# Patient Record
Sex: Female | Born: 1960 | Race: White | Hispanic: No | Marital: Married | Smoking: Former smoker
Health system: Southern US, Community
[De-identification: ages and names within clinical notes are randomized; demographics above are authoritative.]

## PROBLEM LIST (undated history)

## (undated) DIAGNOSIS — Z98811 Dental restoration status: Secondary | ICD-10-CM

## (undated) DIAGNOSIS — T07XXXA Unspecified multiple injuries, initial encounter: Secondary | ICD-10-CM

## (undated) DIAGNOSIS — F329 Major depressive disorder, single episode, unspecified: Secondary | ICD-10-CM

## (undated) DIAGNOSIS — M1990 Unspecified osteoarthritis, unspecified site: Secondary | ICD-10-CM

## (undated) DIAGNOSIS — E039 Hypothyroidism, unspecified: Secondary | ICD-10-CM

## (undated) DIAGNOSIS — F419 Anxiety disorder, unspecified: Secondary | ICD-10-CM

## (undated) DIAGNOSIS — M75100 Unspecified rotator cuff tear or rupture of unspecified shoulder, not specified as traumatic: Secondary | ICD-10-CM

## (undated) DIAGNOSIS — M7551 Bursitis of right shoulder: Secondary | ICD-10-CM

## (undated) DIAGNOSIS — F32A Depression, unspecified: Secondary | ICD-10-CM

## (undated) DIAGNOSIS — M19011 Primary osteoarthritis, right shoulder: Secondary | ICD-10-CM

## (undated) DIAGNOSIS — M24111 Other articular cartilage disorders, right shoulder: Secondary | ICD-10-CM

## (undated) HISTORY — PX: FOOT SURGERY: SHX648

---

## 1992-10-16 HISTORY — PX: REDUCTION MAMMAPLASTY: SUR839

## 1992-10-16 HISTORY — PX: BREAST REDUCTION SURGERY: SHX8

## 1998-06-25 ENCOUNTER — Encounter: Admission: RE | Admit: 1998-06-25 | Discharge: 1998-06-25 | Payer: Self-pay | Admitting: Family Medicine

## 1999-05-03 ENCOUNTER — Ambulatory Visit (HOSPITAL_COMMUNITY): Admission: RE | Admit: 1999-05-03 | Discharge: 1999-05-03 | Payer: Self-pay | Admitting: Obstetrics and Gynecology

## 1999-05-03 ENCOUNTER — Encounter: Payer: Self-pay | Admitting: Obstetrics and Gynecology

## 2000-06-29 ENCOUNTER — Ambulatory Visit (HOSPITAL_BASED_OUTPATIENT_CLINIC_OR_DEPARTMENT_OTHER): Admission: RE | Admit: 2000-06-29 | Discharge: 2000-06-29 | Payer: Self-pay | Admitting: Orthopedic Surgery

## 2000-06-29 HISTORY — PX: ENDOSCOPIC PLANTAR FASCIOTOMY: SUR443

## 2002-04-14 ENCOUNTER — Ambulatory Visit (HOSPITAL_COMMUNITY): Admission: RE | Admit: 2002-04-14 | Discharge: 2002-04-14 | Payer: Self-pay | Admitting: Obstetrics and Gynecology

## 2002-04-14 ENCOUNTER — Encounter: Payer: Self-pay | Admitting: Obstetrics and Gynecology

## 2002-09-04 ENCOUNTER — Encounter: Payer: Self-pay | Admitting: Obstetrics and Gynecology

## 2002-09-08 ENCOUNTER — Encounter (INDEPENDENT_AMBULATORY_CARE_PROVIDER_SITE_OTHER): Payer: Self-pay

## 2002-09-08 ENCOUNTER — Inpatient Hospital Stay (HOSPITAL_COMMUNITY): Admission: RE | Admit: 2002-09-08 | Discharge: 2002-09-09 | Payer: Self-pay | Admitting: Obstetrics and Gynecology

## 2002-09-08 HISTORY — PX: PUBOVAGINAL SLING: SHX1035

## 2002-09-09 HISTORY — PX: VAGINAL HYSTERECTOMY: SUR661

## 2003-01-22 ENCOUNTER — Ambulatory Visit (HOSPITAL_BASED_OUTPATIENT_CLINIC_OR_DEPARTMENT_OTHER): Admission: RE | Admit: 2003-01-22 | Discharge: 2003-01-22 | Payer: Self-pay | Admitting: Orthopedic Surgery

## 2003-01-22 HISTORY — PX: PLANTAR FASCIA RELEASE: SHX2239

## 2003-09-16 ENCOUNTER — Ambulatory Visit (HOSPITAL_COMMUNITY): Admission: RE | Admit: 2003-09-16 | Discharge: 2003-09-16 | Payer: Self-pay | Admitting: Obstetrics and Gynecology

## 2004-06-21 ENCOUNTER — Encounter: Admission: RE | Admit: 2004-06-21 | Discharge: 2004-09-19 | Payer: Self-pay | Admitting: Surgery

## 2004-06-23 ENCOUNTER — Encounter: Admission: RE | Admit: 2004-06-23 | Discharge: 2004-06-23 | Payer: Self-pay | Admitting: Surgery

## 2004-06-28 ENCOUNTER — Ambulatory Visit (HOSPITAL_COMMUNITY): Admission: RE | Admit: 2004-06-28 | Discharge: 2004-06-28 | Payer: Self-pay | Admitting: Surgery

## 2004-07-05 ENCOUNTER — Ambulatory Visit (HOSPITAL_COMMUNITY): Admission: RE | Admit: 2004-07-05 | Discharge: 2004-07-05 | Payer: Self-pay | Admitting: Surgery

## 2004-09-29 ENCOUNTER — Encounter: Admission: RE | Admit: 2004-09-29 | Discharge: 2004-12-28 | Payer: Self-pay | Admitting: Surgery

## 2004-10-06 ENCOUNTER — Encounter: Admission: RE | Admit: 2004-10-06 | Discharge: 2004-10-06 | Payer: Self-pay | Admitting: Surgery

## 2004-10-18 ENCOUNTER — Encounter (INDEPENDENT_AMBULATORY_CARE_PROVIDER_SITE_OTHER): Payer: Self-pay | Admitting: *Deleted

## 2004-10-18 ENCOUNTER — Inpatient Hospital Stay (HOSPITAL_COMMUNITY): Admission: RE | Admit: 2004-10-18 | Discharge: 2004-10-21 | Payer: Self-pay | Admitting: Surgery

## 2004-10-18 HISTORY — PX: ROUX-EN-Y GASTRIC BYPASS: SHX1104

## 2004-10-18 HISTORY — PX: UPPER GI ENDOSCOPY: SHX6162

## 2004-11-07 ENCOUNTER — Encounter: Admission: RE | Admit: 2004-11-07 | Discharge: 2004-11-07 | Payer: Self-pay | Admitting: Obstetrics and Gynecology

## 2005-01-10 ENCOUNTER — Encounter: Admission: RE | Admit: 2005-01-10 | Discharge: 2005-04-10 | Payer: Self-pay | Admitting: Surgery

## 2005-02-20 ENCOUNTER — Ambulatory Visit (HOSPITAL_BASED_OUTPATIENT_CLINIC_OR_DEPARTMENT_OTHER): Admission: RE | Admit: 2005-02-20 | Discharge: 2005-02-20 | Payer: Self-pay | Admitting: Surgery

## 2005-02-20 ENCOUNTER — Ambulatory Visit (HOSPITAL_COMMUNITY): Admission: RE | Admit: 2005-02-20 | Discharge: 2005-02-20 | Payer: Self-pay | Admitting: Surgery

## 2005-02-20 ENCOUNTER — Encounter (INDEPENDENT_AMBULATORY_CARE_PROVIDER_SITE_OTHER): Payer: Self-pay | Admitting: *Deleted

## 2005-04-11 ENCOUNTER — Encounter: Admission: RE | Admit: 2005-04-11 | Discharge: 2005-07-10 | Payer: Self-pay | Admitting: Surgery

## 2005-07-11 ENCOUNTER — Encounter: Admission: RE | Admit: 2005-07-11 | Discharge: 2005-10-09 | Payer: Self-pay | Admitting: Surgery

## 2005-10-31 ENCOUNTER — Encounter: Admission: RE | Admit: 2005-10-31 | Discharge: 2006-01-29 | Payer: Self-pay | Admitting: Surgery

## 2006-01-09 ENCOUNTER — Encounter: Admission: RE | Admit: 2006-01-09 | Discharge: 2006-01-09 | Payer: Self-pay | Admitting: Surgery

## 2006-03-20 ENCOUNTER — Emergency Department (HOSPITAL_COMMUNITY): Admission: EM | Admit: 2006-03-20 | Discharge: 2006-03-20 | Payer: Self-pay | Admitting: Family Medicine

## 2007-01-25 ENCOUNTER — Encounter: Admission: RE | Admit: 2007-01-25 | Discharge: 2007-01-25 | Payer: Self-pay | Admitting: Obstetrics and Gynecology

## 2007-02-18 ENCOUNTER — Emergency Department (HOSPITAL_COMMUNITY): Admission: EM | Admit: 2007-02-18 | Discharge: 2007-02-18 | Payer: Self-pay | Admitting: Emergency Medicine

## 2008-02-03 ENCOUNTER — Encounter: Admission: RE | Admit: 2008-02-03 | Discharge: 2008-02-03 | Payer: Self-pay | Admitting: Surgery

## 2009-03-02 ENCOUNTER — Encounter: Admission: RE | Admit: 2009-03-02 | Discharge: 2009-03-02 | Payer: Self-pay | Admitting: Obstetrics and Gynecology

## 2009-04-01 ENCOUNTER — Ambulatory Visit (HOSPITAL_COMMUNITY): Admission: RE | Admit: 2009-04-01 | Discharge: 2009-04-01 | Payer: Self-pay | Admitting: Orthopaedic Surgery

## 2009-07-06 ENCOUNTER — Encounter: Admission: RE | Admit: 2009-07-06 | Discharge: 2009-07-06 | Payer: Self-pay | Admitting: Surgery

## 2009-12-02 ENCOUNTER — Emergency Department (HOSPITAL_COMMUNITY): Admission: EM | Admit: 2009-12-02 | Discharge: 2009-12-02 | Payer: Self-pay | Admitting: Emergency Medicine

## 2010-10-18 ENCOUNTER — Encounter
Admission: RE | Admit: 2010-10-18 | Discharge: 2010-10-18 | Payer: Self-pay | Source: Home / Self Care | Attending: Internal Medicine | Admitting: Internal Medicine

## 2010-11-06 ENCOUNTER — Encounter: Payer: Self-pay | Admitting: Surgery

## 2010-11-25 ENCOUNTER — Ambulatory Visit (HOSPITAL_COMMUNITY)
Admission: RE | Admit: 2010-11-25 | Discharge: 2010-11-25 | Disposition: A | Discharge status: Home / Self Care | Payer: 59 | Source: Ambulatory Visit | Attending: Gastroenterology | Admitting: Gastroenterology

## 2010-11-25 DIAGNOSIS — R1310 Dysphagia, unspecified: Secondary | ICD-10-CM | POA: Insufficient documentation

## 2010-11-25 DIAGNOSIS — D509 Iron deficiency anemia, unspecified: Secondary | ICD-10-CM | POA: Insufficient documentation

## 2010-11-25 DIAGNOSIS — Z9884 Bariatric surgery status: Secondary | ICD-10-CM | POA: Insufficient documentation

## 2011-03-03 NOTE — Op Note (Signed)
   NAME:  Brooke French, Brooke French                       ACCOUNT NO.:  [id number]   MEDICAL RECORD NO.:  [id number]                   PATIENT TYPE:  INP   LOCATION:  NA                                   FACILITY:  [facility]   PHYSICIAN:  Katherine Roan, M.D.               DATE OF BIRTH:  [birth year]   DATE OF PROCEDURE:  DATE OF DISCHARGE:  09/09/2002                                 OPERATIVE REPORT   PREOPERATIVE DIAGNOSIS:  Profuse periods, stress incontinence with urethral  hypermobility.   POSTOPERATIVE DIAGNOSIS:  Profuse periods, stress incontinence with urethral  hypermobility.   OPERATION PERFORMED:  Pelvic examination under anesthesia, vaginal  hysterectomy.   SURGEON:  Katherine Roan, M.D.   ANESTHESIA:   DESCRIPTION OF PROCEDURE:  The patient was placed in lithotomy position and  prepped and draped in the usual fashion.  Pelvic examination under  anesthesia was accomplished.  The uterus was normal in size and shape.  No  adnexal masses were noted.  A weighted speculum was placed into the vagina  and the cervix was grasped with the tenaculum.  It was injected with 10 cc  of 1% Xylocaine with epinephrine.  The posterior cul-de-sac was incised with  sharp dissection.  A Bonanno weighted retractor was placed into the  posterior cul-de-sac.  The uterosacral ligaments and cardinals were  skeletonized and ligated separately with 0 chromic suture.  The peritoneum  was pushed up inferiorly.  It was quite thickened secondary to her previous  cesarean section.  Small bites of the broad ligament to encompass the  uterine artery and vein and upper broad ligament were accomplished.  The  anterior peritoneum was entered and the remaining portion of the uterus was  removed.  Both ovaries appeared to be normal.  The left ovary was very  small.  The uterosacral ligaments were plicated in the midline with one  suture of 0 Vicryl and then the peritoneum was closed with a running suture  of 2-0  PDS.  The vagina was closed with a continuation of this suture.  Hemostasis was secured.  One additional suture was placed at the uterosacral  ligament insertion for hemostasis.  Dr. Vernie Ammons then did a string procedure.  The patient tolerated the procedure well.  The estimated blood loss was  under 100 cc.                                                Katherine Roan, M.D.    SDM/MEDQ  D:  09/08/2002  T:  09/08/2002  Job:  [id number]

## 2011-03-03 NOTE — Op Note (Signed)
NAMESAWYER, KAHAN             ACCOUNT NO.:  [id number]   MEDICAL RECORD NO.:  [id number]          PATIENT TYPE:  INP   LOCATION:  X001                         FACILITY:  [facility]   PHYSICIAN:  Thornton Park. Daphine Deutscher, MD  DATE OF BIRTH:  December 28, 1960   DATE OF PROCEDURE:  10/18/2004  DATE OF DISCHARGE:                                 OPERATIVE REPORT   PREOPERATIVE DIAGNOSIS:  Morbid obesity.   POSTOPERATIVE DIAGNOSIS:  Morbid obesity.   PROCEDURE:  1.  Laparoscopic Roux-en-Y gastric bypass with 40-cm biliopancreatic limb,      100-cm roux limb placed to the right of the BP limb with the candy cane      to the left.  2.  Upper endoscopy.   SURGEON:  Thornton Park. Daphine Deutscher, M.D.   ASSISTANT:  Vikki Ports, M.D.   ANESTHESIA:  General endotracheal.   DRAINS:  One Blake drain in the upper abdomen.   ESTIMATED BLOOD LOSS:  75 cc.   DESCRIPTION OF PROCEDURE:  Brooke French is a 50 year old morbidly obese  white female who obtained preoperative bariatric counseling which is well  documented in the history.  She was taken to room 1 where the PAS hose were  placed, preoperative antibiotics and preoperative bowel prep, and  preoperative anticoagulants had been given.  The abdomen was prepped widely  with Betadine and draped sterilely.  Through the left upper quadrant, I used  the Optiview technique and entered the abdomen using a 12 trocar without  difficulty.  The abdomen was insufflated and then two 12's were placed in  the right upper quadrant, one to the left of midline and another 5 lateral  on the right, and ultimately a 5-mm in the upper midline for the [facility]  retractor.   The ligament of Treitz was first identified and we went 40 cm distal where I  divided it.  I noted along the way that there were some round darkish areas  in the small bowel that could possibly represent AVMs or a little  hemangiomata.  This was subsequently seen scattered throughout the omentum  and a biopsy of these was sent which showed no evidence of malignancy and  permanent sections are pending.  I used the harmonic scalpel to go into the  mesentery to free this up somewhat and control bleeding with the harmonic  scalpel.   A suture with Penrose was placed on the roux limb and the roux was measured  to be at least 100 cm.  The jejunojejunostomy was created in a side-to-side  fashion aligning the distal end of the biliopancreatic limb with the distal  efferent outflow tract from the roux limb and a common channel was created  using Endo GIA.  The defect was closed with two 2-0 Vicryl sutures with  Tisseel.  A 2-0 silk was used to close the mesenteric defect using  Laparoties on either end.   Next, we went to the upper abdomen where I dissected the cardia and created  a window for subsequent pouch creation.  I then went along the lesser  curvature and divided this with a  single application of the 4-cm stapler  followed by two applications of the 6 and one of the 4 to create a nice  small pouch.  It was approximately 4.5 cm in length.  The bleeder up on the  upper reaches of this was controlled with the clips and Tisseel was applied  to the proximal staple lines.  The Gastrografin was inspected and there was  no evidence of any extra or intracorporeal bleeding.   The roux limb was then brought up and a back row was sutured along the  antimesenteric border with the 2-0 Vicryl.  A common opening was made to the  right at the stomach and with Ewald tube in place to facilitate that.  A  single 4-cm GIA was inserted and fired.  The common opening was inspected  and no bleeding was noted and it was closed with 2-0 Vicryl from either end  tied in the middle.  A second layer using a free suture with needle drivers  of 2-0 Vicryl was used for the Laparotie beginning on the left side and  suturing toward the right.  This completed a two-layer anastomosis and the  Ewald tube was  removed.  With occlusion, Dr. Luan Pulling passed the endoscope  into the gastric pouch and with insufflation, I could see bubbles on the  left side at the gastrojejunostomy.  I placed a U stitch of 2-0 silk,  getting good deep bite and tied this with the Laparotie.  The stomach was  then re-insufflated under pressure and no bubbles were seen.  We irrigated  well.  Tisseel was then applied anteriorly to the staple line and the drain  was placed beneath the liver.  No bleeding was noted and everything appeared  to be in order.  Penrose had been removed from the tip of the roux limb and  the endoscopy showed a nice small pouch with a patent gastrojejunostomy.  We  could not see where the leak was occurring from the inside.  However, the U  stitch of 2-0 silk seemed to completely obliterate that.  After the Tisseel  was applied, the abdomen was deflated and trocars were removed.  Wounds were  injected with Marcaine and then closed with 4-0 Vicryl subcuticularly with  staples as well.     Matt   MBM/MEDQ  D:  10/18/2004  T:  10/18/2004  Job:  [id number]

## 2011-03-03 NOTE — Op Note (Signed)
   NAME:  Brooke French, Brooke French                       ACCOUNT NO.:  [id number]   MEDICAL RECORD NO.:  [id number]                   PATIENT TYPE:  AMB   LOCATION:  DSC                                  FACILITY:   PHYSICIAN:  Loreta Ave, M.D.              DATE OF BIRTH:  [birth year]   DATE OF PROCEDURE:  01/22/2003  DATE OF DISCHARGE:                                 OPERATIVE REPORT   PREOPERATIVE DIAGNOSIS:  Chronic plantar fasciitis, left heel.   POSTOPERATIVE DIAGNOSIS:  Chronic plantar fasciitis, left heel.   OPERATION/PROCEDURE:  Fluoroscopically and endoscopically guided plantar  fascia release, left heel.   SURGEON:  Loreta Ave, M.D.   ASSISTANT:  Arlys John D. Petrarca, P.A.-C.   ANESTHESIA:  Ankle block and general.   SPECIMENS:  None.   CULTURES:  None.   COMPLICATIONS:  None.   DRESSINGS:  Sterile compressive.   TOURNIQUET TIME:  20 minutes.   DESCRIPTION OF PROCEDURE:  The patient was brought to the operating room and  after adequate anesthesia had been obtained, calf tourniquet was applied.  Prepped and draped in the usual sterile fashion.  Exsanguinated with  elevation and Esmarch.  Tourniquet inflated to 250 mmHg.  Fluoroscopic  assistance to isolate the plantar fascia attachment to the spur at the os  calcis.  Small incision made on the medial and lateral aspects of this.  Spreading instrument was used to clear off the undersurface of the plantar  fascia and then a cutting cannula inserted from medial to lateral aspects.  Under endoscopic guidance, the plantar fascia was cut in its entirety of the  os calcis attachment from medial to lateral sides, protecting injury from  neurovascular structures.  After an adequate confirmation of complete  release, arthroscope was removed.  Wound was irrigated and injected with  Marcaine as were the margins of the wound.  The wound was closed with nylon.  Sterile compressive dressing applied.  Tourniquet deflated and  removed.  Anesthesia reversed.  Brought to the recovery room.  Tolerated the surgery  well without complications.                                                Loreta Ave, M.D.    DFM/MEDQ  D:  01/22/2003  T:  01/23/2003  Job:  [id number]

## 2011-03-03 NOTE — Op Note (Signed)
NAME:  Brooke French, Brooke French                       ACCOUNT NO.:  [id number]   MEDICAL RECORD NO.:  [id number]                   PATIENT TYPE:  INP   LOCATION:  X003                                 FACILITY:  [facility]   PHYSICIAN:  Mark C. Vernie Ammons, M.D.               DATE OF BIRTH:  [birth year]   DATE OF PROCEDURE:  09/08/2002  DATE OF DISCHARGE:                                 OPERATIVE REPORT   PREOPERATIVE DIAGNOSIS:  Stress urinary incontinence.   POSTOPERATIVE DIAGNOSIS:  Stress urinary incontinence.   OPERATION PERFORMED:  Spark sling.   SURGEON:  Mark C. Vernie Ammons, M.D.   ANESTHESIA:  General.   ESTIMATED BLOOD LOSS:  Approximately 25 cc.   DRAINS:  16 French Foley catheter.   SPECIMENS:  None.   COMPLICATIONS:  None.   INDICATIONS FOR PROCEDURE:  The patient is a 50 year old white female  patient of Dr. Elana Alm, who has significant urinary incontinence with cough  and was felt to have type 3 stress urinary incontinence by Dr. Earlene Plater.  A  straining cystogram revealed descensus of the bladder and urodynamics back  in 1998 showed high leak point pressure.  Her incontinence since that time  has worsened and occurs now with little provocation.  She was found to have  marked Q-tip hypermobility with Valsalva on exam and has not history of  irritative voiding symptoms or unstable bladder.  She is felt to be a good  candidate for a sling procedure.  She understands the risks and  complications associated with this and has elected to proceed.   DESCRIPTION OF PROCEDURE:  After informed consent, the patient was brought  to the major operating room, placed on the table and administered general  anesthesia and then moved to dorsal lithotomy position after which Dr.  Elana Alm performed a transvaginal hysterectomy which is dictated by him  separately.  After he had completed his portion of the operation, with a  weighted speculum in the vagina and a 16 French Foley catheter in the  bladder, the urethral region was palpated.  An incision in the anterior  vaginal mucosa over the mid urethra was made and dissected laterally  exposing the urethra.  Two separate stab incisions were then made in the  pubic region approximately three fingerbreadths apart just above the  symphysis pubis and the bladder fully drained of urine with the catheter.  A  cystoscope sheath was placed in the urethra drawing the urethra away from  the fat as I was passing the trocar and the trocar was then passed first on  the left side, then the right side down behind the symphysis pubis and out  through the vaginal incision at the mid urethral level.  This was performed  on both sides identically.  The Foley catheter was reinserted in the bladder  and the [facility] tape was affixed to the trocars and brought up through the  suprapubic incisions.  I then adjusted the tension so there was no tension  on the sling, after which the sheath was removed from the sling material and  the excess sling material was cut flush with the skin and the incisions were  copiously irrigated with antibiotic solution.  The suprapubic incisions were  closed with Dermabond and the vaginal incision was closed with a running #1  Vicryl suture.  The Foley catheter was left indwelling and the patient was  taken to the recovery room in stable satisfactory condition.  During the  operation, cystoscopy was performed after passage of both trocars and then  after passage of the sling material and no injury to the bladder was  identified.  There were also no lesions, tumors or stones or other  abnormalities within the bladder.   The patient will be observed in the hospital and the catheter will be left  indwelling overnight and removed tomorrow for a voiding trial.                                                 Loraine Leriche C. Vernie Ammons, M.D.    MCO/MEDQ  D:  09/08/2002  T:  09/08/2002  Job:  [id number]   cc:   S. Kyra Manges, M.D.  [phone]  [address]  [place]  [place] [address]  Fax: [phone]

## 2011-03-03 NOTE — Op Note (Signed)
[facility]  Patient:    Brooke French, Brooke French                    MRN: [id number] Proc. Date: 06/29/00 Adm. Date:  [id number] Disc. Date: [id number] Attending:  Colbert Ewing                           Operative Report  PREOPERATIVE DIAGNOSIS:  ____________ plantar fasciitis, right foot.  POSTOPERATIVE DIAGNOSIS:  ____________ plantar fasciitis, right foot.  OPERATION PERFORMED:  Endoscopic plantar fascia release, right foot.  SURGEON:  Loreta Ave, M.D.  ANESTHESIA:  Ankle block with posterior tibial nerve block.  TOURNIQUET TIME:  30 minutes.  SPECIMENS:  None.  CULTURES:  None.  COMPLICATIONS:  None.  DRESSING:  Soft compressive with wooden shoe.  DESCRIPTION OF PROCEDURE:  Patient brought to the operating room and after adequate anesthesia had been obtained, prepped and draped in the usual sterile fashion.  Exsanguinated with elevation and use of an Esmarch.  Esmarch used for tourniquet at the ankle.  With fluoroscopic guidance a small incision was made on the medial and lateral aspect of the foot just plantar to the plantar fascia and distal to the os calcis attachment.  The spreading device for endoscopic release was used to spread tissue below the plantar fascia.  The cannula was then inserted from medial to lateral.  Under direct visualization the plantar fascia was released in its entirety with a hook knife avoiding neurovascular structures.  The wound was irrigated throughout.  The wound was closed with nylon.  Sterile compressive dressing applied with wooden shoe. Tourniquet inflated and removed.  Anesthesia reversed.  Brought to recovery room.  Tolerated surgery well.  No complications. DD:  06/29/00 TD:  07/02/00 Job: [id number]

## 2011-03-03 NOTE — Op Note (Signed)
[name], Brooke French             ACCOUNT NO.:  [id number]   MEDICAL RECORD NO.:  [id number]          PATIENT TYPE:  INP   LOCATION:  X001                         FACILITY:  [facility]   PHYSICIAN:  Vikki Ports, MDDATE OF BIRTH:  May 22, 1961   DATE OF PROCEDURE:  10/18/2004  DATE OF DISCHARGE:                                 OPERATIVE REPORT   PREOPERATIVE DIAGNOSIS:  Morbid obesity.   POSTOPERATIVE DIAGNOSIS:  Morbid obesity.   PROCEDURE:  Upper endoscopy at the conclusion of the laparoscopic Roux-En-Y  gastric bypass.   FINDINGS:  Initially a leak was seen and then after suture repair which will  be dictated later, the pouch was intact with patent anastomosis and no  evidence of leak.  Endoscope was removed after the stomach pouch was  decompressed.      KRH/MEDQ  D:  10/18/2004  T:  10/18/2004  Job:  [id number]

## 2011-03-03 NOTE — H&P (Signed)
NAME:  Brooke French, PATLAN                       ACCOUNT NO.:  [id number]   MEDICAL RECORD NO.:  [id number]                   PATIENT TYPE:  INP   LOCATION:  NA                                   FACILITY:  [facility]   PHYSICIAN:  Katherine Roan, M.D.               DATE OF BIRTH:  26-Apr-1961   DATE OF ADMISSION:  DATE OF DISCHARGE:                                HISTORY & PHYSICAL   CHIEF COMPLAINT:  Heavy periods.   HISTORY OF PRESENT ILLNESS:  Brooke French is a 50 year old Gravida I, Para I  female who had a C-section in 1996 who complains of heavy painful periods  despite the fact that we have tried her on birth control pills, even one and  two daily. Because of the continued bleeding, hysterectomy was offered. PAP  smear was negative in August. She currently takes Synthroid and Prozac. She  was cystoscoped in 1965. She had a breast reduction in 1994. Right heel  plantar fascia surgery in 2001.   ALLERGIES:  No known drug allergies.   REVIEW OF SYSTEMS:  HEENT, she wears glasses but notes no dizziness,  decreased in visual or auditory acuity. No frequent headaches. Heart, no  hypertension. No rheumatic fever or chest pain. Lungs, no chronic cough. She  recently had bronchitis but feels she is free from that at this point. GU,  she has been evaluated by Dr. Ihor Gully and found to have urethral  instability and he feels that a sling would work for her. GI, no bowel habit  change. No weight loss or melena. MS, no fractures or arthritis.   SOCIAL HISTORY:  She works at [employer]. Drinks alcohol  socially. Does not smoke.   FAMILY HISTORY:  Her mother is 56 years of age. She is a borderline diabetic  and has mild hypertension. Her father is 73 years of age. He is diabetic and  has coronary artery disease and has had a coronary artery bypass grafting.  He also has emphysema. She has two brothers, one deceased from a cerebral  aneurysm and one is a diabetic. Her father  had skin cancer and her father,  uncle and brother are both diabetics.   PHYSICAL EXAMINATION:  GENERAL: Pleasant, cooperative, alert, well  developed, well nourished  female who appeared to be her stated age of 4  years.  VITAL SIGNS: Blood pressure 140/80. Weight 284 pounds.  HEENT: Unremarkable examination. Pupils are equal, round, and reactive to  light and accommodation. Extraocular muscles intact. Oropharynx not  injected.  NECK: Supple. Carotid pulses are equal. No bruits. Thyroid not enlarged.  BREASTS: No masses or tenderness.  LUNGS: Clear to auscultation and percussion.  ABDOMEN: Soft and obese. Liver, spleen, and kidneys are not palpated. Bowel  sounds are normal. No tenderness noted.  GU: Vulva and vagina are normal. Cervix is clean. Uterus is anterior. Adnexa  negative. Rectovaginal confirms.  EXTREMITIES:  Good range of motion. Equal pulses and reflexes.   IMPRESSION:  1. Perfuse periods.  2. Stress incontinence.   PLAN:  Examination under anesthesia. Possible vaginal versus abdominal  hysterectomy. Risks and benefits have been discussed with the patient  including infection, hemorrhage, damage to bladder and bowel, and vascular  injuries.                                                Katherine Roan, M.D.    SDM/MEDQ  D:  09/05/2002  T:  09/05/2002  Job:  [id number]

## 2011-03-03 NOTE — Discharge Summary (Signed)
[name], [name] NO.:  [id number]   MEDICAL RECORD NO.:  [id number]           PATIENT TYPE:   LOCATION:  0482                         FACILITY:  [facility]   PHYSICIAN:  Thornton Park. Daphine Deutscher, MD  DATE OF BIRTH:  [birth year]   DATE OF ADMISSION:  10/18/2004  DATE OF DISCHARGE:  10/21/2004                                 DISCHARGE SUMMARY   PROCEDURE:  On October 18, 2004 laparoscopic Roux-en-Y gastric bypass.   HOSPITAL COURSE:  Brooke French was taken to the operating room on  Tuesday, October 18, 2004 and given general anesthesia.  She underwent  laparoscopic Roux-en-Y gastric bypass with follow up in the OR endoscopy.  On postoperative day #1, she had an upper GI swallow which showed a patent  anastomosis and a good pouch and DVT studies which were negative.  Her drain  drained a minimal amount and she was started on liquids and on postoperative  days #2 and #3, her diet was advanced appropriately.  She did well and was  ready for discharge on Friday, January 6.   FINAL DIAGNOSIS:  Morbid obesity, status post laparoscopic Roux-en-Y gastric  bypass.   CONDITION ON DISCHARGE:  Good.     Matt   MBM/MEDQ  D:  10/21/2004  T:  10/21/2004  Job:  [id number]

## 2011-07-12 ENCOUNTER — Inpatient Hospital Stay (INDEPENDENT_AMBULATORY_CARE_PROVIDER_SITE_OTHER)
Admission: RE | Admit: 2011-07-12 | Discharge: 2011-07-12 | Disposition: A | Discharge status: Home / Self Care | Payer: Commercial Managed Care - PPO | Source: Ambulatory Visit | Attending: Emergency Medicine | Admitting: Emergency Medicine

## 2011-07-12 DIAGNOSIS — J019 Acute sinusitis, unspecified: Secondary | ICD-10-CM

## 2011-10-20 ENCOUNTER — Other Ambulatory Visit (HOSPITAL_COMMUNITY): Payer: Self-pay | Admitting: Orthopedic Surgery

## 2011-10-20 DIAGNOSIS — M25569 Pain in unspecified knee: Secondary | ICD-10-CM

## 2011-10-24 ENCOUNTER — Ambulatory Visit (HOSPITAL_COMMUNITY)
Admission: RE | Admit: 2011-10-24 | Discharge: 2011-10-24 | Disposition: A | Discharge status: Home / Self Care | Payer: 59 | Source: Ambulatory Visit | Attending: Orthopedic Surgery | Admitting: Orthopedic Surgery

## 2011-10-24 DIAGNOSIS — S83509A Sprain of unspecified cruciate ligament of unspecified knee, initial encounter: Secondary | ICD-10-CM | POA: Insufficient documentation

## 2011-10-24 DIAGNOSIS — M25569 Pain in unspecified knee: Secondary | ICD-10-CM

## 2011-10-24 DIAGNOSIS — X500XXA Overexertion from strenuous movement or load, initial encounter: Secondary | ICD-10-CM | POA: Insufficient documentation

## 2011-10-24 DIAGNOSIS — M25469 Effusion, unspecified knee: Secondary | ICD-10-CM | POA: Insufficient documentation

## 2011-10-25 NOTE — Op Note (Signed)
[facility] [address] [place], [place]  [address]  OPERATIVE PROCEDURE REPORT  PATIENT:  Brooke French, Brooke French   MR#:  [id number] BIRTHDATE:  09-04-1961  GENDER:  female ENDOSCOPIST:  Coralyn Mark Dr.Mann, MD ASSISTANT:  Ara Kussmaul, Technician and Dwain Sarna, RN CGRN.  PROCEDURE DATE:  11/25/2010 PRE-PROCEDURE PREPERATION:  The patient was prepped with 32 oz. of Suprep the night before the procedure and 32 oz. the morning of the procedure.  The patient was fasted for 4 hours prior to the procedure. PRE-PROCEDURE PHYSICAL:  Patient has stable vital signs. Neck is supple. There is no JVD, thyromegaly or LAD. Chest clear to auscultation. S1 and S2 regular. Abdomen soft, non-distended, non-tender with NABS. PROCEDURE:  Diagnostic colonoscopy. ASA CLASS:  Class II INDICATIONS:  1) Colorectal cancer screening 2) Iron deficiency anemia. MEDICATIONS:  Fentanyl 50 mcg & Versed 5 mg IV.  DESCRIPTION OF PROCEDURE: After the risks, benefits, and alternatives of the procedure were thoroughly explained [including a 10% missed rate of cancer and polyps], informed consent was obtained.  Digital rectal exam was performed.  The Pentax Colonoscope [id number] colonoscope was introduced through the anus and advanced to the terminal ileum which was intubated for a short distance, without limitations.  The quality of the prep was excellent. Multiple washes were done. Small lesions could be missed. The instrument was then slowly withdrawn as the colon was fully examined. <<PROCEDUREIMAGES>>  FINDINGS:  Except for a lipoma in the proximal right colon [recognized by its yellowish submucosal appearance] the entire colonic mucosa appeared healthy with a normal vascular pattern. No masses, polyps, diverticula or AVM's were noted. The appendiceal orifice and the ICV were identified and photographed. The terminal ileum appeared normal.  Retroflexed views revealed no abnormalities. The  patient tolerated the procedure without immediate complications.  The scope was then withdrawn from the patient and the procedure terminated.  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 RECOMMENDATIONS:  1) Continue surveillance. 2) OP follow-up is advised on a PRN basis.  REPEAT EXAM:  In 10 years; in case the patient has any abnormal GI symptoms in the interim, she should contact the office immediately for further recommendations.  DISCHARGE INSTRUCTIONS:  Standard discharge instructions given.  ______________________________ Benjamine Mola, MD  CPT CODES: 27253  DIAGNOSIS CODES:  V76.51, 280.9  CC:  Geoffry Paradise, M.D.  n. eSIGNED:   Dr.Jyothi Anastasio Auerbach at 10/25/2011 10:17 AM  Lamarr Lulas, [phone]

## 2011-11-09 ENCOUNTER — Encounter (HOSPITAL_BASED_OUTPATIENT_CLINIC_OR_DEPARTMENT_OTHER): Payer: Self-pay | Admitting: *Deleted

## 2011-11-15 NOTE — H&P (Signed)
[facility] [address]   [address] [place], [place] [address] [phone] A Division of [facility]  Loreta Ave, M.D.     Robert A. Thurston Hole, M.D.     Lunette Stands, M.D. Eulas Post, M.D.    Buford Dresser, M.D. Estell Harpin, M.D. Ralene Cork, D.O.          Genene Churn. Barry Dienes, PA-C            Kirstin A. Shepperson, PA-C [place], OPA-C  RE: Brooke French, Brooke French   [id number]      DOB: May 19, 1961 PROGRESS NOTE: 10-20-11 51 year old patient of Dr. Greig Right she's having problems with her left knee, over New Years she was dancing doing a twisting maneuver heard and felt a pop, was able to continue as she had been drinking but the following morning woke with significant swelling in the left knee. It has improved since then but she continues to have pain with twisting. She denies any instability. Review of systems: this knee was injured in 1995 and treated conservatively. No known drug allergies. No diabetes or cholesterol problems. Complete review of 14 systems negative. Past medical history: she takes Prozac, Synthroid, Biotin Vitamin E and vitamin D. Previous surgery for hysterectomy breast reduction gastric bypass and plantar fascia surgery. Family history is noncontributory but reviewed. Social history: she does not smoke she drinks rarely is employed as Surveyor, mining for [employer].   EXAMINATION: 51 year old moderately overweight. Ambulates with a limp. Left knee has moderate swelling compared to the right, most of the swelling is medial. She has tenderness along both joint lines she describes pain as deep and posterior range of motion of the knee -10 degrees to full extension flexion to about 90 degrees. Varus and valgus stability intact anterior drawer test is 1+ with a soft end point. Right knee is also 1+ with a more definite end point. She resists McMurray's maneuver on the left it is uncomfortable, I was unable  to produce a pivot shift.  X-RAYS: Standing AP lateral and patellar view of the left knee unremarkable.  IMPRESSION: Rule out meniscal or ACL tear or both.  PLAN: We'll proceed with an MRI. I also discussed this with Dr. Eulah Pont who agrees and examined her today. Follow up after MRI to delineate therapeutic recommendations.  Lunette Stands, M.D.  Electronically verified by Lunette Stands, M.D. AV:kh D 10-20-11 T 10-23-11  [facility] [address]   [address] [place],  [address] [phone] A Division of [facility]  Loreta Ave, M.D.     Robert A. Thurston Hole, M.D.     Lunette Stands, M.D. Eulas Post, M.D.    Buford Dresser, M.D. Estell Harpin, M.D. Ralene Cork, D.O.          Genene Churn. Barry Dienes, PA-C            Kirstin A. Shepperson, PA-C [place], OPA-C   RE: Brooke French, Brooke French   [id number]      DOB: [birth year] PROGRESS NOTE: 10-26-11 I went over [name]'s MRI x-rays history and reviewed exam with her today. Vertical load twisting injury with a pivot shift phenomenon. Exam MRI and history consistent with an ACL injury with anterolateral rotary instability. No meniscal tears. Her swelling is better she's able to full weight bear. Motion 0-90.  DISPOSITION: Long talk with her about diagnosis and treatment options. We talked about conservative treatment cautious activity  versus allograft reconstruction. Based on her injury and description of symptoms I think that the best recommendation is reconstruction. She'll consider options and let me know how she wants to proceed. All questions answered pre-op precautions exercise program outlined.  Loreta Ave, M.D.  Electronically verified by Loreta Ave, M.D. DFM:kh D 10-26-11 T 10-27-11

## 2011-11-16 ENCOUNTER — Encounter (HOSPITAL_BASED_OUTPATIENT_CLINIC_OR_DEPARTMENT_OTHER): Payer: Self-pay | Admitting: Certified Registered Nurse Anesthetist

## 2011-11-16 ENCOUNTER — Ambulatory Visit (HOSPITAL_BASED_OUTPATIENT_CLINIC_OR_DEPARTMENT_OTHER): Payer: 59 | Admitting: Certified Registered Nurse Anesthetist

## 2011-11-16 ENCOUNTER — Ambulatory Visit (HOSPITAL_BASED_OUTPATIENT_CLINIC_OR_DEPARTMENT_OTHER)
Admission: RE | Admit: 2011-11-16 | Discharge: 2011-11-17 | Disposition: A | Discharge status: Home / Self Care | Payer: 59 | Source: Ambulatory Visit | Attending: Orthopedic Surgery | Admitting: Orthopedic Surgery

## 2011-11-16 ENCOUNTER — Encounter (HOSPITAL_BASED_OUTPATIENT_CLINIC_OR_DEPARTMENT_OTHER)
Admission: RE | Disposition: A | Discharge status: Home / Self Care | Payer: Self-pay | Source: Ambulatory Visit | Attending: Orthopedic Surgery

## 2011-11-16 ENCOUNTER — Encounter (HOSPITAL_BASED_OUTPATIENT_CLINIC_OR_DEPARTMENT_OTHER): Payer: Self-pay | Admitting: *Deleted

## 2011-11-16 DIAGNOSIS — Z4789 Encounter for other orthopedic aftercare: Secondary | ICD-10-CM

## 2011-11-16 DIAGNOSIS — M2350 Chronic instability of knee, unspecified knee: Secondary | ICD-10-CM | POA: Insufficient documentation

## 2011-11-16 DIAGNOSIS — M9420 Chondromalacia, unspecified site: Secondary | ICD-10-CM | POA: Insufficient documentation

## 2011-11-16 HISTORY — DX: Depression, unspecified: F32.A

## 2011-11-16 HISTORY — PX: KNEE ARTHROSCOPY W/ ACL RECONSTRUCTION: SHX1858

## 2011-11-16 HISTORY — DX: Anxiety disorder, unspecified: F41.9

## 2011-11-16 HISTORY — DX: Major depressive disorder, single episode, unspecified: F32.9

## 2011-11-16 HISTORY — DX: Hypothyroidism, unspecified: E03.9

## 2011-11-16 SURGERY — KNEE ARTHROSCOPY WITH ANTERIOR CRUCIATE LIGAMENT (ACL) REPAIR
Anesthesia: General | Site: Knee | Laterality: Left | Wound class: Clean

## 2011-11-16 MED ORDER — POTASSIUM CHLORIDE IN NACL 20-0.45 MEQ/L-% IV SOLN: INTRAVENOUS | Status: DC

## 2011-11-16 MED ORDER — MORPHINE SULFATE (PF) 1 MG/ML IV SOLN: INTRAVENOUS | Status: DC

## 2011-11-16 MED ORDER — OXYCODONE-ACETAMINOPHEN 5-325 MG PO TABS
1.0000 | ORAL_TABLET | ORAL | Status: DC | PRN
  Administered 2011-11-16: 2 via ORAL
  Administered 2011-11-17: 1 via ORAL

## 2011-11-16 MED ORDER — LIDOCAINE HCL (CARDIAC) 20 MG/ML IV SOLN
INTRAVENOUS | Status: DC | PRN
  Administered 2011-11-16: 60 mg via INTRAVENOUS

## 2011-11-16 MED ORDER — DIPHENHYDRAMINE HCL 50 MG/ML IJ SOLN: 12.5000 mg | Freq: Four times a day (QID) | INTRAMUSCULAR | Status: DC | PRN

## 2011-11-16 MED ORDER — CEFAZOLIN SODIUM-DEXTROSE 2-3 GM-% IV SOLR
2.0000 g | INTRAVENOUS | Status: AC
  Administered 2011-11-16: 2 g via INTRAVENOUS

## 2011-11-16 MED ORDER — ONDANSETRON HCL 4 MG/2ML IJ SOLN
INTRAMUSCULAR | Status: DC | PRN
  Administered 2011-11-16: 4 mg via INTRAVENOUS

## 2011-11-16 MED ORDER — MIDAZOLAM HCL 5 MG/5ML IJ SOLN
INTRAMUSCULAR | Status: DC | PRN
  Administered 2011-11-16: 1 mg via INTRAVENOUS

## 2011-11-16 MED ORDER — PROMETHAZINE HCL 25 MG/ML IJ SOLN: 6.2500 mg | INTRAMUSCULAR | Status: DC | PRN

## 2011-11-16 MED ORDER — METHOCARBAMOL 100 MG/ML IJ SOLN: 500.0000 mg | Freq: Four times a day (QID) | INTRAVENOUS | Status: DC | PRN

## 2011-11-16 MED ORDER — DIPHENHYDRAMINE HCL 12.5 MG/5ML PO ELIX: 12.5000 mg | ORAL_SOLUTION | Freq: Four times a day (QID) | ORAL | Status: DC | PRN

## 2011-11-16 MED ORDER — DEXAMETHASONE SODIUM PHOSPHATE 10 MG/ML IJ SOLN
INTRAMUSCULAR | Status: DC | PRN
  Administered 2011-11-16: 10 mg via INTRAVENOUS

## 2011-11-16 MED ORDER — BUPIVACAINE-EPINEPHRINE PF 0.5-1:200000 % IJ SOLN
INTRAMUSCULAR | Status: DC | PRN
  Administered 2011-11-16: 30 mL

## 2011-11-16 MED ORDER — ONDANSETRON HCL 4 MG/2ML IJ SOLN: 4.0000 mg | Freq: Four times a day (QID) | INTRAMUSCULAR | Status: DC | PRN

## 2011-11-16 MED ORDER — POTASSIUM CHLORIDE IN NACL 20-0.9 MEQ/L-% IV SOLN: INTRAVENOUS | Status: DC

## 2011-11-16 MED ORDER — KCL IN DEXTROSE-NACL 20-5-0.45 MEQ/L-%-% IV SOLN
INTRAVENOUS | Status: DC
  Administered 2011-11-16: 17:00:00 via INTRAVENOUS

## 2011-11-16 MED ORDER — METHOCARBAMOL 500 MG PO TABS: 500.0000 mg | ORAL_TABLET | Freq: Four times a day (QID) | ORAL | Status: DC | PRN

## 2011-11-16 MED ORDER — MIDAZOLAM HCL 2 MG/2ML IJ SOLN
1.0000 mg | INTRAMUSCULAR | Status: DC | PRN
  Administered 2011-11-16: 2 mg via INTRAVENOUS

## 2011-11-16 MED ORDER — HYDROMORPHONE HCL PF 1 MG/ML IJ SOLN: 0.2500 mg | INTRAMUSCULAR | Status: DC | PRN

## 2011-11-16 MED ORDER — FENTANYL CITRATE 0.05 MG/ML IJ SOLN
50.0000 ug | INTRAMUSCULAR | Status: DC | PRN
  Administered 2011-11-16: 100 ug via INTRAVENOUS

## 2011-11-16 MED ORDER — NALOXONE HCL 0.4 MG/ML IJ SOLN: 0.4000 mg | INTRAMUSCULAR | Status: DC | PRN

## 2011-11-16 MED ORDER — LEVOTHYROXINE SODIUM 137 MCG PO TABS: 137.0000 ug | ORAL_TABLET | Freq: Every day | ORAL | Status: DC

## 2011-11-16 MED ORDER — MORPHINE SULFATE 4 MG/ML IJ SOLN
INTRAMUSCULAR | Status: DC | PRN
  Administered 2011-11-16 (×2): via INTRA_ARTICULAR

## 2011-11-16 MED ORDER — PROPOFOL 10 MG/ML IV EMUL
INTRAVENOUS | Status: DC | PRN
  Administered 2011-11-16: 260 mg via INTRAVENOUS

## 2011-11-16 MED ORDER — CEFAZOLIN SODIUM 1-5 GM-% IV SOLN
1.0000 g | Freq: Three times a day (TID) | INTRAVENOUS | Status: AC
  Administered 2011-11-17: 1 g via INTRAVENOUS

## 2011-11-16 MED ORDER — SODIUM CHLORIDE 0.9 % IJ SOLN: 9.0000 mL | INTRAMUSCULAR | Status: DC | PRN

## 2011-11-16 MED ORDER — LACTATED RINGERS IV SOLN
INTRAVENOUS | Status: DC
  Administered 2011-11-16 (×2): via INTRAVENOUS

## 2011-11-16 MED ORDER — FLUOXETINE HCL 40 MG PO CAPS: 40.0000 mg | ORAL_CAPSULE | Freq: Every day | ORAL | Status: DC

## 2011-11-16 MED ORDER — DOCUSATE SODIUM 100 MG PO CAPS: 100.0000 mg | ORAL_CAPSULE | Freq: Two times a day (BID) | ORAL | Status: DC

## 2011-11-16 MED ORDER — CEFAZOLIN SODIUM 1-5 GM-% IV SOLN: 1.0000 g | INTRAVENOUS | Status: DC

## 2011-11-16 MED ORDER — FENTANYL CITRATE 0.05 MG/ML IJ SOLN
INTRAMUSCULAR | Status: DC | PRN
  Administered 2011-11-16 (×3): 25 ug via INTRAVENOUS

## 2011-11-16 MED ORDER — DROPERIDOL 2.5 MG/ML IJ SOLN
INTRAMUSCULAR | Status: DC | PRN
  Administered 2011-11-16: 0.625 mg via INTRAVENOUS

## 2011-11-16 MED ORDER — ONDANSETRON HCL 4 MG PO TABS: 4.0000 mg | ORAL_TABLET | Freq: Four times a day (QID) | ORAL | Status: DC | PRN

## 2011-11-16 SURGICAL SUPPLY — 88 items
ANCHOR PUSHLOCK PEEK 3.5X19.5 (Anchor) ×2 IMPLANT
BANDAGE ELASTIC 4 VELCRO ST LF (GAUZE/BANDAGES/DRESSINGS) IMPLANT
BANDAGE ELASTIC 6 VELCRO ST LF (GAUZE/BANDAGES/DRESSINGS) IMPLANT
BANDAGE ESMARK 6X9 LF (GAUZE/BANDAGES/DRESSINGS) ×1 IMPLANT
BENZOIN TINCTURE PRP APPL 2/3 (GAUZE/BANDAGES/DRESSINGS) ×2 IMPLANT
BIOSCREW 8X25 (Screw) ×2 IMPLANT
BIOSCREW 9X25 (Screw) ×2 IMPLANT
BIT DRILL 67X1.5XWRPS STRL (BIT) ×1 IMPLANT
BIT DRILL QC 3.5X195 (BIT) IMPLANT
BIT DRL 67X1.5XWRPS STRL (BIT) ×1
BLADE 4.2CUDA (BLADE) IMPLANT
BLADE AVERAGE 25X9 (BLADE) ×2 IMPLANT
BLADE CUDA 5.5 (BLADE) IMPLANT
BLADE CUDA GRT WHITE 3.5 (BLADE) IMPLANT
BLADE CUTTER GATOR 3.5 (BLADE) ×2 IMPLANT
BLADE CUTTER MENIS 5.5 (BLADE) IMPLANT
BLADE GREAT WHITE 4.2 (BLADE) ×2 IMPLANT
BLADE SURG 15 STRL LF DISP TIS (BLADE) ×2 IMPLANT
BLADE SURG 15 STRL SS (BLADE) ×2
BNDG ESMARK 6X9 LF (GAUZE/BANDAGES/DRESSINGS) ×2
BUR EGG 3PK/BX (BURR) ×2 IMPLANT
BUR OVAL 6.0 (BURR) ×2 IMPLANT
CANISTER OMNI JUG 16 LITER (MISCELLANEOUS) ×2 IMPLANT
CANISTER SUCTION 2500CC (MISCELLANEOUS) IMPLANT
CLOTH BEACON ORANGE TIMEOUT ST (SAFETY) ×2 IMPLANT
COVER TABLE BACK 60X90 (DRAPES) ×2 IMPLANT
CUFF TOURNIQUET SINGLE 34IN LL (TOURNIQUET CUFF) IMPLANT
CUTTER MENISCUS  4.2MM (BLADE)
CUTTER MENISCUS 4.2MM (BLADE) IMPLANT
DECANTER SPIKE VIAL GLASS SM (MISCELLANEOUS) IMPLANT
DRAPE ARTHROSCOPY W/POUCH 114 (DRAPES) ×2 IMPLANT
DRAPE U-SHAPE 47X51 STRL (DRAPES) ×2 IMPLANT
DRILL BIT WIRE PASS (BIT) ×1
DRSG PAD ABDOMINAL 8X10 ST (GAUZE/BANDAGES/DRESSINGS) ×2 IMPLANT
DURAPREP 26ML APPLICATOR (WOUND CARE) ×2 IMPLANT
ELECT MENISCUS 165MM 90D (ELECTRODE) IMPLANT
ELECT REM PT RETURN 9FT ADLT (ELECTROSURGICAL) ×2
ELECTRODE REM PT RTRN 9FT ADLT (ELECTROSURGICAL) ×1 IMPLANT
GAUZE XEROFORM 1X8 LF (GAUZE/BANDAGES/DRESSINGS) ×2 IMPLANT
GLOVE BIO SURGEON STRL SZ 6.5 (GLOVE) ×2 IMPLANT
GLOVE BIOGEL PI IND STRL 7.0 (GLOVE) ×1 IMPLANT
GLOVE BIOGEL PI IND STRL 8 (GLOVE) ×1 IMPLANT
GLOVE BIOGEL PI INDICATOR 7.0 (GLOVE) ×1
GLOVE BIOGEL PI INDICATOR 8 (GLOVE) ×1
GLOVE ORTHO TXT STRL SZ7.5 (GLOVE) ×6 IMPLANT
GOWN BRE IMP PREV XXLGXLNG (GOWN DISPOSABLE) ×2 IMPLANT
GOWN PREVENTION PLUS XLARGE (GOWN DISPOSABLE) ×2 IMPLANT
GRAFT ACHILLES TENDON (Bone Implant) ×2 IMPLANT
IMMOBILIZER KNEE 22 UNIV (SOFTGOODS) ×2 IMPLANT
IMMOBILIZER KNEE 24 THIGH 36 (MISCELLANEOUS) ×1 IMPLANT
IMMOBILIZER KNEE 24 UNIV (MISCELLANEOUS) ×2
KNEE WRAP E Z 3 GEL PACK (MISCELLANEOUS) ×2 IMPLANT
KNIFE GRAFT ACL 10MM 5952 (MISCELLANEOUS) IMPLANT
KNIFE GRAFT ACL 9MM (MISCELLANEOUS) IMPLANT
NEEDLE MENISCAL REPAIR DBL ARM (NEEDLE) IMPLANT
NS IRRIG 1000ML POUR BTL (IV SOLUTION) ×2 IMPLANT
PACK ARTHROSCOPY DSU (CUSTOM PROCEDURE TRAY) ×2 IMPLANT
PACK BASIN DAY SURGERY FS (CUSTOM PROCEDURE TRAY) ×2 IMPLANT
PAD CAST 4YDX4 CTTN HI CHSV (CAST SUPPLIES) ×1 IMPLANT
PADDING CAST COTTON 4X4 STRL (CAST SUPPLIES) ×1
PADDING CAST COTTON 6X4 STRL (CAST SUPPLIES) ×2 IMPLANT
PASSER SUT SWANSON 36MM LOOP (INSTRUMENTS) IMPLANT
PENCIL BUTTON HOLSTER BLD 10FT (ELECTRODE) ×2 IMPLANT
PUSHLOCK PEEK 4.5X24 (Orthopedic Implant) IMPLANT
SCREW BIO 8X25 (Screw) ×1 IMPLANT
SCREW BIO 9X25 (Screw) ×1 IMPLANT
SCREW LO PRO 25MM (Screw) ×2 IMPLANT
SET ARTHROSCOPY TUBING (MISCELLANEOUS) ×1
SET ARTHROSCOPY TUBING LN (MISCELLANEOUS) ×1 IMPLANT
SLEEVE SCD COMPRESS KNEE MED (MISCELLANEOUS) ×2 IMPLANT
SPONGE GAUZE 4X4 12PLY (GAUZE/BANDAGES/DRESSINGS) ×4 IMPLANT
SPONGE LAP 4X18 X RAY DECT (DISPOSABLE) IMPLANT
STRIP CLOSURE SKIN 1/2X4 (GAUZE/BANDAGES/DRESSINGS) ×2 IMPLANT
SUCTION FRAZIER TIP 10 FR DISP (SUCTIONS) IMPLANT
SUT 2 FIBERLOOP 20 STRT BLUE (SUTURE) ×4
SUT ETHILON 3 0 PS 1 (SUTURE) IMPLANT
SUT FIBERWIRE #2 38 T-5 BLUE (SUTURE) ×8
SUT VIC AB 0 CT1 27 (SUTURE)
SUT VIC AB 0 CT1 27XBRD ANBCTR (SUTURE) IMPLANT
SUT VIC AB 2-0 SH 27 (SUTURE) ×1
SUT VIC AB 2-0 SH 27XBRD (SUTURE) ×1 IMPLANT
SUT VIC AB 3-0 SH 27 (SUTURE) ×1
SUT VIC AB 3-0 SH 27X BRD (SUTURE) ×1 IMPLANT
SUT VICRYL 4-0 PS2 18IN ABS (SUTURE) IMPLANT
SUTURE 2 FIBERLOOP 20 STRT BLU (SUTURE) ×2 IMPLANT
SUTURE FIBERWR #2 38 T-5 BLUE (SUTURE) ×4 IMPLANT
TOWEL OR 17X24 6PK STRL BLUE (TOWEL DISPOSABLE) ×2 IMPLANT
WATER STERILE IRR 1000ML POUR (IV SOLUTION) ×2 IMPLANT

## 2011-11-16 NOTE — Brief Op Note (Signed)
11/16/2011  3:33 PM  PATIENT:  Brooke French  51 y.o. female  PRE-OPERATIVE DIAGNOSIS:  sprain strain tear knee cruciate ligament  POST-OPERATIVE DIAGNOSIS:  Left Anterior Cruciate ligament Tear  PROCEDURE:  Procedure(s): Left KNEE ARTHROSCOPY WITH ANTERIOR CRUCIATE LIGAMENT (ACL) REPAIR  SURGEON:  Surgeon(s): Loreta Ave, MD  PHYSICIAN ASSISTANT: Zonia Kief M   ANESTHESIA:   regional and general  EBL:  Total I/O In: 1600 [I.V.:1600] Out: -   SPECIMEN:  No Specimen  TOURNIQUET:   Total Tourniquet Time Documented: Thigh (Left) - 70 minutes   PATIENT DISPOSITION:  PACU - hemodynamically stable.

## 2011-11-16 NOTE — Anesthesia Procedure Notes (Addendum)
Anesthesia Regional Block:  Femoral nerve block  Pre-Anesthetic Checklist: ,, timeout performed, Correct Patient, Correct Site, Correct Laterality, Correct Procedure, Correct Position, site marked, Risks and benefits discussed,  Surgical consent,  Pre-op evaluation,  At surgeon's request and post-op pain management  Laterality: Left  Prep: chloraprep       Needles:  Injection technique: Single-shot  Needle Type: Stimulator Needle - 40      Needle Gauge: 22 and 22 G    Additional Needles:  Procedures: nerve stimulator Femoral nerve block  Nerve Stimulator or Paresthesia:  Response: patella twitch, 0.45 mA, 0.1 ms,   Additional Responses:   Narrative:  Start time: 11/16/2011 12:24 PM End time: 11/16/2011 12:29 PM Injection made incrementally with aspirations every 5 mL.  Performed by: Personally  Anesthesiologist: Sandford Craze, MD  Additional Notes: Pt identified in Holding room.  Monitors applied. Working IV access confirmed. Sterile prep.  #22ga PNS to patella twitch at 0.15mA threshold.  30cc 0.5% Bupivacaine with 1:200k epi injected incrementally after negative test dose.  Patient asymptomatic, VSS, no heme aspirated, tolerated well.   Sandford Craze, MD   Procedure Name: LMA Insertion Date/Time: 11/16/2011 1:50 PM Performed by: Leavy Heatherly D Pre-anesthesia Checklist: Patient identified, Emergency Drugs available, Suction available and Patient being monitored Patient Re-evaluated:Patient Re-evaluated prior to inductionOxygen Delivery Method: Circle System Utilized Preoxygenation: Pre-oxygenation with 100% oxygen Intubation Type: IV induction Ventilation: Mask ventilation without difficulty LMA: LMA inserted LMA Size: 4.0 Number of attempts: 1 Placement Confirmation: positive ETCO2 Tube secured with: Tape Dental Injury: Teeth and Oropharynx as per pre-operative assessment

## 2011-11-16 NOTE — Transfer of Care (Signed)
Immediate Anesthesia Transfer of Care Note  Patient: Brooke French  Procedure(s) Performed:  KNEE ARTHROSCOPY WITH ANTERIOR CRUCIATE LIGAMENT (ACL) REPAIR - left knee arthroscopy anterior cruciate ligament repair  Patient Location: PACU  Anesthesia Type: GA combined with regional for post-op pain  Level of Consciousness: awake, alert , oriented and patient cooperative  Airway & Oxygen Therapy: Patient Spontanous Breathing and Patient connected to face mask oxygen  Post-op Assessment: Report given to PACU RN and Post -op Vital signs reviewed and stable  Post vital signs: Reviewed and stable  Complications: No apparent anesthesia complications

## 2011-11-16 NOTE — Progress Notes (Signed)
Assisted Dr. Jackson with left, femoral block. Side rails up, monitors on throughout procedure. See vital signs in flow sheet. Tolerated Procedure well. 

## 2011-11-16 NOTE — Interval H&P Note (Signed)
History and Physical Interval Note:  11/16/2011 7:25 AM  Brooke French  has presented today for surgery, with the diagnosis of sprain strain tear knee cruciate ligament  The various methods of treatment have been discussed with the patient and family. After consideration of risks, benefits and other options for treatment, the patient has consented to  Procedure(s): KNEE ARTHROSCOPY WITH ANTERIOR CRUCIATE LIGAMENT (ACL) ALLOGRAFT RECONSTRUCTION as a surgical intervention .  The patients' history has been reviewed, patient examined, no change in status, stable for surgery.  I have reviewed the patients' chart and labs.  Questions were answered to the patient's satisfaction.     Donnice Nielsen F

## 2011-11-16 NOTE — Anesthesia Preprocedure Evaluation (Addendum)
Anesthesia Evaluation  Patient identified by MRN, date of birth, ID band Patient awake    Reviewed: Allergy & Precautions, H&P , NPO status , Patient's Chart, lab work & pertinent test results  History of Anesthesia Complications Negative for: history of anesthetic complications  Airway Mallampati: I TM Distance: >3 FB Neck ROM: Full    Dental No notable dental hx. (+) Teeth Intact and Dental Advisory Given   Pulmonary neg pulmonary ROS, former smoker clear to auscultation  Pulmonary exam normal       Cardiovascular neg cardio ROS Regular Normal    Neuro/Psych Negative Neurological ROS     GI/Hepatic negative GI ROS, Neg liver ROS,   Endo/Other  Hypothyroidism (treated)   Renal/GU negative Renal ROS     Musculoskeletal   Abdominal (+) obese,   Peds  Hematology negative hematology ROS (+)   Anesthesia Other Findings   Reproductive/Obstetrics                          Anesthesia Physical Anesthesia Plan  ASA: II  Anesthesia Plan: General   Post-op Pain Management:    Induction: Intravenous  Airway Management Planned: LMA  Additional Equipment:   Intra-op Plan:   Post-operative Plan:   Informed Consent: I have reviewed the patients History and Physical, chart, labs and discussed the procedure including the risks, benefits and alternatives for the proposed anesthesia with the patient or authorized representative who has indicated his/her understanding and acceptance.   Dental advisory given  Plan Discussed with: CRNA and Surgeon  Anesthesia Plan Comments: (Plan routine monitors, GETA with femoral nerve block for post op analgesia )        Anesthesia Quick Evaluation

## 2011-11-16 NOTE — Anesthesia Postprocedure Evaluation (Signed)
  Anesthesia Post-op Note  Patient: Brooke French  Procedure(s) Performed:  KNEE ARTHROSCOPY WITH ANTERIOR CRUCIATE LIGAMENT (ACL) REPAIR - left knee arthroscopy anterior cruciate ligament repair  Patient Location: PACU  Anesthesia Type: GA combined with regional for post-op pain  Level of Consciousness: awake and sedated  Airway and Oxygen Therapy: Patient Spontanous Breathing and Patient connected to nasal cannula oxygen  Post-op Pain: mild  Post-op Assessment: Post-op Vital signs reviewed, Patient's Cardiovascular Status Stable, Respiratory Function Stable, Patent Airway, No signs of Nausea or vomiting and Pain level controlled  Post-op Vital Signs: Reviewed and stable  Complications: No apparent anesthesia complications

## 2011-11-17 NOTE — Op Note (Signed)
NAME:  Brooke French, Brooke French NO.:  MEDICAL RECORD NO.:  [id number]  LOCATION:                                 FACILITY:  PHYSICIAN:  Loreta Ave, M.D. DATE OF BIRTH:  31-Oct-1960  DATE OF PROCEDURE:  11/16/2011 DATE OF DISCHARGE:                              OPERATIVE REPORT   PREOPERATIVE DIAGNOSIS:  Left knee anterior cruciate ligament tear with anterolateral rotary instability.  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  PROCEDURE:  Left knee exam under anesthesia, arthroscopy.  Arthroscopic endoscopic anterior cruciate ligament reconstruction, Achilles allograft.  Bioabsorbable screw fixation above and below with a metallic screw post fixation distally.  Notchplasty.  Chondroplasty of medial femoral condyle, removal of chondral loose bodies.  SURGEON:  Loreta Ave, MD  ASSISTANT:  Genene Churn. Barry Dienes, [place], present throughout the entire case and necessary for timely completion of procedure.  ANESTHESIA:  General.  BLOOD LOSS:  Minimal.  SPECIMENS:  None.  CULTURES:  None.  COMPLICATION:  None.  DRESSINGS:  Soft compressive knee immobilizer.  TOURNIQUET TIME:  1 hour.  PROCEDURE:  The patient was brought to the operating room and placed on the operating table in supine position.  After adequate anesthesia had been obtained, knee examined.  Full motion.  Positive Lachman, positive drawer, and positive pivot shift.  Tourniquet applied, prepped and draped in usual sterile fashion.  Exsanguinated with elevation of Esmarch.  Tourniquet inflated to 350 mmHg.  Two portals, 1 each medial and lateral parapatellar.  Arthroscope was introduced, knee distended and inspected.  Good patellar tracking.  Some chondral loose bodies emanating from an acute on chronic  grade 2 and 3 lesion throughout much of the weightbearing dome of medial femoral condyle.  Chondroplasty to a stable surface.  Nothing grade 4.  Medial meniscus, lateral meniscus intact.  Lateral compartment looked good.  Complete mid substance tear, ACL irreparable.  Moderately narrow notch.  ACL debrided.  Notchplasty with shaver and high-speed bur.  Achilles graft prepared for 10 mm tunnels.  An incision next to the tibial tubercle.  Guidewire driven from there up through the footprint of the ACL.  Overdrilled with a 10- mm reamer.  Debris cleared with a shaver.  Femoral guide inserted across the tibial tunnel notch on the back cortex of femur.  K-wire driven and overdrilled with a 10-mm reamer for appropriate depth of the graft and pegs.  Debris cleared throughout.  Tunnels assessed and found to be in good position.  Tubing Passer inserted across the both tunnels and out through a stab wound anterolateral thigh.  Nitinol wire brought to the medial portal out through the femoral tunnel.  Graft attached to tubing Passer, pulled in across the knee seating the PEG well in the femoral tunnel.  Over a Nitinol wire, this was fixed with an 8 x 25 bioabsorbable screw.  Good capturing and fixation.  Knee placed to 70 degrees.  Posterior drawer applied, graft pulled tightly and fixed to the tibial tunnel with a 9 x  25 screw.  Because of underlying osteopenia, I wanted to add more fixation distally.  A drill hole was made distal to the exiting tibial tunnel.  Additional fixation with a 3.5 and then a 4.5 PushLock neither one of which gave me good stability. I therefore put in a 6.5 mm x 25 mm metallic Arthrex pose screw. Sutures were tied over that and that was countersunk down smoothly. When this was completed, good fixation of the graft above and below. Full motion.  Good clearance of graft when viewed arthroscopically through full motion.  Excellent stability with Lachman and drawer.   The entire knee examined arthroscopically, no other findings.  Instruments and fluid removed.  Portals closed with nylon.  Incision closed subcutaneously with subcuticular Vicryl.  Sterile compressive dressing applied.  Tourniquet deflated and removed.  Knee immobilizer applied. Anesthesia reversed.  Brought to the recovery room.  Tolerated the surgery well.  No complications.     Loreta Ave, M.D.     DFM/MEDQ  D:  11/16/2011  T:  11/17/2011  Job:  [id number]

## 2011-12-20 ENCOUNTER — Ambulatory Visit: Payer: 59 | Attending: Orthopedic Surgery | Admitting: Physical Therapy

## 2011-12-20 DIAGNOSIS — M25669 Stiffness of unspecified knee, not elsewhere classified: Secondary | ICD-10-CM | POA: Insufficient documentation

## 2011-12-20 DIAGNOSIS — IMO0001 Reserved for inherently not codable concepts without codable children: Secondary | ICD-10-CM | POA: Insufficient documentation

## 2011-12-27 ENCOUNTER — Ambulatory Visit: Payer: 59 | Admitting: Physical Therapy

## 2011-12-28 ENCOUNTER — Other Ambulatory Visit: Payer: Self-pay | Admitting: Internal Medicine

## 2011-12-28 DIAGNOSIS — Z1231 Encounter for screening mammogram for malignant neoplasm of breast: Secondary | ICD-10-CM

## 2012-01-03 ENCOUNTER — Ambulatory Visit: Payer: 59 | Admitting: Physical Therapy

## 2012-01-09 ENCOUNTER — Ambulatory Visit
Admission: RE | Admit: 2012-01-09 | Discharge: 2012-01-09 | Disposition: A | Discharge status: Home / Self Care | Payer: 59 | Source: Ambulatory Visit | Attending: Internal Medicine | Admitting: Internal Medicine

## 2012-01-09 DIAGNOSIS — Z1231 Encounter for screening mammogram for malignant neoplasm of breast: Secondary | ICD-10-CM

## 2012-09-16 ENCOUNTER — Other Ambulatory Visit (HOSPITAL_COMMUNITY): Payer: Self-pay | Admitting: Physical Medicine and Rehabilitation

## 2012-09-16 DIAGNOSIS — M545 Low back pain: Secondary | ICD-10-CM

## 2012-09-18 ENCOUNTER — Ambulatory Visit (HOSPITAL_COMMUNITY)
Admission: RE | Admit: 2012-09-18 | Discharge: 2012-09-18 | Disposition: A | Discharge status: Home / Self Care | Payer: 59 | Source: Ambulatory Visit | Attending: Physical Medicine and Rehabilitation | Admitting: Physical Medicine and Rehabilitation

## 2012-09-18 DIAGNOSIS — M79609 Pain in unspecified limb: Secondary | ICD-10-CM | POA: Insufficient documentation

## 2012-09-18 DIAGNOSIS — M545 Low back pain, unspecified: Secondary | ICD-10-CM | POA: Insufficient documentation

## 2012-12-19 ENCOUNTER — Other Ambulatory Visit: Payer: Self-pay

## 2012-12-19 DIAGNOSIS — Z1231 Encounter for screening mammogram for malignant neoplasm of breast: Secondary | ICD-10-CM

## 2013-01-09 ENCOUNTER — Ambulatory Visit
Admission: RE | Admit: 2013-01-09 | Discharge: 2013-01-09 | Disposition: A | Discharge status: Home / Self Care | Payer: 59 | Source: Ambulatory Visit

## 2013-01-09 DIAGNOSIS — Z1231 Encounter for screening mammogram for malignant neoplasm of breast: Secondary | ICD-10-CM

## 2013-08-18 ENCOUNTER — Other Ambulatory Visit: Payer: Self-pay | Admitting: Obstetrics and Gynecology

## 2014-01-01 ENCOUNTER — Encounter (HOSPITAL_BASED_OUTPATIENT_CLINIC_OR_DEPARTMENT_OTHER): Payer: Self-pay | Admitting: *Deleted

## 2014-01-01 NOTE — Progress Notes (Signed)
Pt works here-no labs needed

## 2014-01-02 ENCOUNTER — Encounter (HOSPITAL_BASED_OUTPATIENT_CLINIC_OR_DEPARTMENT_OTHER): Payer: Self-pay | Admitting: Orthopedic Surgery

## 2014-01-02 ENCOUNTER — Ambulatory Visit (HOSPITAL_BASED_OUTPATIENT_CLINIC_OR_DEPARTMENT_OTHER)
Admission: RE | Admit: 2014-01-02 | Discharge: 2014-01-02 | Disposition: A | Discharge status: Home / Self Care | Payer: 59 | Source: Ambulatory Visit | Attending: Orthopedic Surgery | Admitting: Orthopedic Surgery

## 2014-01-02 ENCOUNTER — Encounter (HOSPITAL_BASED_OUTPATIENT_CLINIC_OR_DEPARTMENT_OTHER): Payer: 59 | Admitting: Anesthesiology

## 2014-01-02 ENCOUNTER — Ambulatory Visit (HOSPITAL_BASED_OUTPATIENT_CLINIC_OR_DEPARTMENT_OTHER): Payer: 59 | Admitting: Anesthesiology

## 2014-01-02 ENCOUNTER — Encounter (HOSPITAL_BASED_OUTPATIENT_CLINIC_OR_DEPARTMENT_OTHER)
Admission: RE | Disposition: A | Discharge status: Home / Self Care | Payer: Self-pay | Source: Ambulatory Visit | Attending: Orthopedic Surgery

## 2014-01-02 DIAGNOSIS — F329 Major depressive disorder, single episode, unspecified: Secondary | ICD-10-CM | POA: Insufficient documentation

## 2014-01-02 DIAGNOSIS — F3289 Other specified depressive episodes: Secondary | ICD-10-CM | POA: Insufficient documentation

## 2014-01-02 DIAGNOSIS — F411 Generalized anxiety disorder: Secondary | ICD-10-CM | POA: Insufficient documentation

## 2014-01-02 DIAGNOSIS — Z9884 Bariatric surgery status: Secondary | ICD-10-CM | POA: Insufficient documentation

## 2014-01-02 DIAGNOSIS — G5600 Carpal tunnel syndrome, unspecified upper limb: Secondary | ICD-10-CM | POA: Insufficient documentation

## 2014-01-02 DIAGNOSIS — Z87891 Personal history of nicotine dependence: Secondary | ICD-10-CM | POA: Insufficient documentation

## 2014-01-02 DIAGNOSIS — E039 Hypothyroidism, unspecified: Secondary | ICD-10-CM | POA: Insufficient documentation

## 2014-01-02 HISTORY — PX: STERIOD INJECTION: SHX5046

## 2014-01-02 HISTORY — PX: CARPAL TUNNEL RELEASE: SHX101

## 2014-01-02 SURGERY — CARPAL TUNNEL RELEASE
Anesthesia: General | Site: Wrist | Laterality: Right

## 2014-01-02 MED ORDER — EPHEDRINE SULFATE 50 MG/ML IJ SOLN
INTRAMUSCULAR | Status: DC | PRN
  Administered 2014-01-02: 15 mg via INTRAVENOUS

## 2014-01-02 MED ORDER — CEFAZOLIN SODIUM-DEXTROSE 2-3 GM-% IV SOLR
2.0000 g | INTRAVENOUS | Status: AC
  Administered 2014-01-02: 2 g via INTRAVENOUS

## 2014-01-02 MED ORDER — ONDANSETRON HCL 4 MG/2ML IJ SOLN
INTRAMUSCULAR | Status: DC | PRN
  Administered 2014-01-02: 4 mg via INTRAVENOUS

## 2014-01-02 MED ORDER — HYDROCODONE-ACETAMINOPHEN 5-325 MG PO TABS
ORAL_TABLET | ORAL | Status: AC
  Filled 2014-01-02: qty 1

## 2014-01-02 MED ORDER — LIDOCAINE HCL (PF) 1 % IJ SOLN
INTRAMUSCULAR | Status: AC
  Filled 2014-01-02: qty 5

## 2014-01-02 MED ORDER — BETAMETHASONE SOD PHOS & ACET 6 (3-3) MG/ML IJ SUSP
INTRAMUSCULAR | Status: AC
  Filled 2014-01-02: qty 1

## 2014-01-02 MED ORDER — FENTANYL CITRATE 0.05 MG/ML IJ SOLN: 50.0000 ug | INTRAMUSCULAR | Status: DC | PRN

## 2014-01-02 MED ORDER — BETAMETHASONE SOD PHOS & ACET 6 (3-3) MG/ML IJ SUSP
INTRAMUSCULAR | Status: DC | PRN
  Administered 2014-01-02: 6 mg

## 2014-01-02 MED ORDER — GLYCOPYRROLATE 0.2 MG/ML IJ SOLN
INTRAMUSCULAR | Status: DC | PRN
  Administered 2014-01-02: 0.2 mg via INTRAVENOUS

## 2014-01-02 MED ORDER — 0.9 % SODIUM CHLORIDE (POUR BTL) OPTIME
TOPICAL | Status: DC | PRN
  Administered 2014-01-02: 200 mL

## 2014-01-02 MED ORDER — PROMETHAZINE HCL 25 MG/ML IJ SOLN: 6.2500 mg | INTRAMUSCULAR | Status: DC | PRN

## 2014-01-02 MED ORDER — HYDROCODONE-ACETAMINOPHEN 5-325 MG PO TABS
1.0000 | ORAL_TABLET | Freq: Once | ORAL | Status: AC | PRN
  Administered 2014-01-02: 1 via ORAL

## 2014-01-02 MED ORDER — FENTANYL CITRATE 0.05 MG/ML IJ SOLN
INTRAMUSCULAR | Status: AC
  Filled 2014-01-02: qty 6

## 2014-01-02 MED ORDER — BUPIVACAINE HCL (PF) 0.25 % IJ SOLN
INTRAMUSCULAR | Status: DC | PRN
  Administered 2014-01-02: 8 mL

## 2014-01-02 MED ORDER — HYDROCODONE-ACETAMINOPHEN 5-325 MG PO TABS: 1.0000 | ORAL_TABLET | Freq: Four times a day (QID) | ORAL | Status: DC | PRN

## 2014-01-02 MED ORDER — LIDOCAINE HCL (CARDIAC) 20 MG/ML IV SOLN
INTRAVENOUS | Status: DC | PRN
  Administered 2014-01-02: 50 mg via INTRAVENOUS

## 2014-01-02 MED ORDER — CHLORHEXIDINE GLUCONATE 4 % EX LIQD: 60.0000 mL | Freq: Once | CUTANEOUS | Status: DC

## 2014-01-02 MED ORDER — LIDOCAINE HCL (PF) 1 % IJ SOLN
INTRAMUSCULAR | Status: DC | PRN
  Administered 2014-01-02: 1 mL

## 2014-01-02 MED ORDER — LACTATED RINGERS IV SOLN
INTRAVENOUS | Status: DC
  Administered 2014-01-02: 14:00:00 via INTRAVENOUS

## 2014-01-02 MED ORDER — FENTANYL CITRATE 0.05 MG/ML IJ SOLN
INTRAMUSCULAR | Status: AC
  Filled 2014-01-02: qty 2

## 2014-01-02 MED ORDER — OXYCODONE HCL 5 MG/5ML PO SOLN: 5.0000 mg | Freq: Once | ORAL | Status: DC | PRN

## 2014-01-02 MED ORDER — HYDROMORPHONE HCL PF 1 MG/ML IJ SOLN: 0.2500 mg | INTRAMUSCULAR | Status: DC | PRN

## 2014-01-02 MED ORDER — PROPOFOL 10 MG/ML IV BOLUS
INTRAVENOUS | Status: DC | PRN
  Administered 2014-01-02: 300 mg via INTRAVENOUS

## 2014-01-02 MED ORDER — METHYLPREDNISOLONE ACETATE 40 MG/ML IJ SUSP
INTRAMUSCULAR | Status: AC
  Filled 2014-01-02: qty 1

## 2014-01-02 MED ORDER — MIDAZOLAM HCL 5 MG/5ML IJ SOLN
INTRAMUSCULAR | Status: DC | PRN
  Administered 2014-01-02: 2 mg via INTRAVENOUS

## 2014-01-02 MED ORDER — OXYCODONE HCL 5 MG PO TABS: 5.0000 mg | ORAL_TABLET | Freq: Once | ORAL | Status: DC | PRN

## 2014-01-02 MED ORDER — BUPIVACAINE HCL (PF) 0.25 % IJ SOLN
INTRAMUSCULAR | Status: AC
  Filled 2014-01-02: qty 30

## 2014-01-02 MED ORDER — MIDAZOLAM HCL 2 MG/2ML IJ SOLN
INTRAMUSCULAR | Status: AC
  Filled 2014-01-02: qty 2

## 2014-01-02 MED ORDER — MIDAZOLAM HCL 2 MG/2ML IJ SOLN: 1.0000 mg | INTRAMUSCULAR | Status: DC | PRN

## 2014-01-02 MED ORDER — FENTANYL CITRATE 0.05 MG/ML IJ SOLN
INTRAMUSCULAR | Status: DC | PRN
Start: 2014-01-02 — End: 2014-01-02
  Administered 2014-01-02: 50 ug via INTRAVENOUS
  Administered 2014-01-02: 100 ug via INTRAVENOUS

## 2014-01-02 SURGICAL SUPPLY — 42 items
BLADE SURG 15 STRL LF DISP TIS (BLADE) ×2 IMPLANT
BLADE SURG 15 STRL SS (BLADE) ×1
BNDG COHESIVE 3X5 TAN STRL LF (GAUZE/BANDAGES/DRESSINGS) ×3 IMPLANT
BNDG ESMARK 4X9 LF (GAUZE/BANDAGES/DRESSINGS) ×3 IMPLANT
BNDG GAUZE ELAST 4 BULKY (GAUZE/BANDAGES/DRESSINGS) ×3 IMPLANT
CHLORAPREP W/TINT 26ML (MISCELLANEOUS) ×3 IMPLANT
CORDS BIPOLAR (ELECTRODE) ×3 IMPLANT
COVER MAYO STAND STRL (DRAPES) ×3 IMPLANT
COVER TABLE BACK 60X90 (DRAPES) ×3 IMPLANT
CUFF TOURNIQUET SINGLE 18IN (TOURNIQUET CUFF) ×3 IMPLANT
DRAPE EXTREMITY T 121X128X90 (DRAPE) ×3 IMPLANT
DRAPE SURG 17X23 STRL (DRAPES) ×3 IMPLANT
DRSG KUZMA FLUFF (GAUZE/BANDAGES/DRESSINGS) IMPLANT
DRSG PAD ABDOMINAL 8X10 ST (GAUZE/BANDAGES/DRESSINGS) ×3 IMPLANT
GAUZE XEROFORM 1X8 LF (GAUZE/BANDAGES/DRESSINGS) ×3 IMPLANT
GLOVE BIOGEL PI IND STRL 7.0 (GLOVE) ×2 IMPLANT
GLOVE BIOGEL PI IND STRL 8.5 (GLOVE) ×2 IMPLANT
GLOVE BIOGEL PI INDICATOR 7.0 (GLOVE) ×1
GLOVE BIOGEL PI INDICATOR 8.5 (GLOVE) ×1
GLOVE ECLIPSE 7.0 STRL STRAW (GLOVE) ×3 IMPLANT
GLOVE EXAM NITRILE MD LF STRL (GLOVE) ×3 IMPLANT
GLOVE SURG ORTHO 8.0 STRL STRW (GLOVE) ×3 IMPLANT
GOWN STRL REUS W/ TWL LRG LVL3 (GOWN DISPOSABLE) ×2 IMPLANT
GOWN STRL REUS W/TWL LRG LVL3 (GOWN DISPOSABLE) ×1
GOWN STRL REUS W/TWL XL LVL3 (GOWN DISPOSABLE) ×3 IMPLANT
NDL SAFETY ECLIPSE 18X1.5 (NEEDLE) ×2 IMPLANT
NEEDLE 27GAX1X1/2 (NEEDLE) ×6 IMPLANT
NEEDLE HYPO 18GX1.5 SHARP (NEEDLE) ×1
NS IRRIG 1000ML POUR BTL (IV SOLUTION) ×3 IMPLANT
PACK BASIN DAY SURGERY FS (CUSTOM PROCEDURE TRAY) ×3 IMPLANT
PAD CAST 3X4 CTTN HI CHSV (CAST SUPPLIES) ×2 IMPLANT
PADDING CAST ABS 4INX4YD NS (CAST SUPPLIES)
PADDING CAST ABS COTTON 4X4 ST (CAST SUPPLIES) IMPLANT
PADDING CAST COTTON 3X4 STRL (CAST SUPPLIES) ×1
SPONGE GAUZE 4X4 12PLY (GAUZE/BANDAGES/DRESSINGS) ×3 IMPLANT
STOCKINETTE 4X48 STRL (DRAPES) ×3 IMPLANT
SUT VICRYL 4-0 PS2 18IN ABS (SUTURE) IMPLANT
SUT VICRYL RAPIDE 4/0 PS 2 (SUTURE) ×3 IMPLANT
SYR BULB 3OZ (MISCELLANEOUS) ×3 IMPLANT
SYR CONTROL 10ML LL (SYRINGE) ×6 IMPLANT
TOWEL OR 17X24 6PK STRL BLUE (TOWEL DISPOSABLE) ×3 IMPLANT
UNDERPAD 30X30 INCONTINENT (UNDERPADS AND DIAPERS) ×3 IMPLANT

## 2014-01-02 NOTE — Brief Op Note (Signed)
01/02/2014  4:09 PM  PATIENT:  Dannielle Karvonenolleen F Manolis  53 y.o. female  PRE-OPERATIVE DIAGNOSIS:  Bilateral carpal tunnel syndrome  POST-OPERATIVE DIAGNOSIS:  Bilateral carpal tunnel syndrome  PROCEDURE:  Procedure(s): RIGHT CARPAL TUNNEL RELEASE (Right) STEROID INJECTION LEFT WRIST  (CARPAL TUNNEL) (Left)  SURGEON:  Surgeon(s) and Role:    * Nicki ReaperGary R Ranessa Kosta, MD - Primary  PHYSICIAN ASSISTANT:   ASSISTANTS: none   ANESTHESIA:   local and general  EBL:     BLOOD ADMINISTERED:none  DRAINS: none   LOCAL MEDICATIONS USED:  BUPIVICAINE   SPECIMEN:  No Specimen  DISPOSITION OF SPECIMEN:  N/A  COUNTS:  YES  TOURNIQUET:   Total Tourniquet Time Documented: Upper Arm (Right) - 13 minutes Total: Upper Arm (Right) - 13 minutes   DICTATION: .Other Dictation: Dictation Number [id number]  PLAN OF CARE: Discharge to home after PACU  PATIENT DISPOSITION:  PACU - hemodynamically stable.

## 2014-01-02 NOTE — Anesthesia Preprocedure Evaluation (Signed)
Anesthesia Evaluation  Patient identified by MRN, date of birth, ID band Patient awake    Reviewed: Allergy & Precautions, H&P , NPO status   Airway Mallampati: I      Dental  (+) Teeth Intact   Pulmonary former smoker,  breath sounds clear to auscultation        Cardiovascular negative cardio ROS  Rhythm:Regular Rate:Normal     Neuro/Psych Anxiety Depression    GI/Hepatic negative GI ROS, Neg liver ROS, Reux en Y   Endo/Other  Hypothyroidism   Renal/GU      Musculoskeletal   Abdominal   Peds  Hematology   Anesthesia Other Findings   Reproductive/Obstetrics                           Anesthesia Physical Anesthesia Plan  ASA: II  Anesthesia Plan: General   Post-op Pain Management:    Induction: Intravenous  Airway Management Planned: LMA  Additional Equipment:   Intra-op Plan:   Post-operative Plan: Extubation in OR  Informed Consent: I have reviewed the patients History and Physical, chart, labs and discussed the procedure including the risks, benefits and alternatives for the proposed anesthesia with the patient or authorized representative who has indicated his/her understanding and acceptance.   Dental advisory given  Plan Discussed with: Surgeon  Anesthesia Plan Comments:         Anesthesia Quick Evaluation

## 2014-01-02 NOTE — Discharge Instructions (Addendum)

## 2014-01-02 NOTE — H&P (Signed)
  Brooke French is a 53 year-old right-hand dominant female who comes in with complaint of pain, numbness and tingling especially right greater than left. This has been going on for several months.  She has no history of trauma to the hand or to the neck.  She awakens occasionally one to two out of seven nights.  She has history of thyroid problems, no history of diabetes, arthritis or gout.  She complains of intermittent, severe, stabbing, throbbing, aching type pain with a feeling of numbness and weakness.  She has been wearing a brace.  She has been taking Aleve with minimal improvement. She has had nerve conductions done by Dr. Johna RolesPelligra revealing carpal tunnel syndrome bilaterally with a motor delay of 6.3/right and 4.1/left.  Sensory delay 3.5/right, 2.5/left with amplitude diminution to 37/left and 14/right  ALLERGIES:     None.  MEDICATIONS:    Prozac, Synthroid, Biotin, Advil, Aleve, stool softener.  SURGICAL HISTORY:     Bilateral breast reduction, gastric bypass, bilateral plantar fascitis resection, C-section, hysterectomy, ACL left knee and cysto.  FAMILY MEDICAL HISTORY:    Positive for diabetes, otherwise negative.  SOCIAL HISTORY:     She does not smoke, drinks occasionally.  She is married.  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   REVIEW OF SYSTEMS:   Positive for glasses, easy bruising, otherwise negative. Brooke French is an 53 y.o. female.   Chief Complaint: bilateral cts HPI: see above  Past Medical History  Diagnosis Date  . Hypothyroidism   . Depression   . Anxiety   . ACL tear 10/2011    left    Past Surgical History  Procedure Laterality Date  . 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 Endoscopic plantar fasciotomy  01/22/2003 - left; 06/29/2000 - right  . Pubovaginal sling  09/08/2002  . Vaginal hysterectomy  09/09/2002  . Cesarean section  1996  . Breast reduction surgery  1994  . Knee arthroscopy  2013    left    History reviewed. No pertinent family  history. Social History:  reports that she has quit smoking. She has never used smokeless tobacco. She reports that she drinks alcohol. She reports that she does not use illicit drugs.  Allergies: No Known Allergies  No prescriptions prior to admission    No results found for this or any previous visit (from the past 48 hour(s)).  No results found.   Pertinent items are noted in HPI.  Height 5\' 4"  (1.626 m), weight 250 lb (113.399 kg).  General appearance: alert, cooperative and appears stated age Head: Normocephalic, without obvious abnormality Neck: no JVD Resp: clear to auscultation bilaterally Cardio: regular rate and rhythm, S1, S2 normal, no murmur, click, rub or gallop GI: soft, non-tender; bowel sounds normal; no masses,  no organomegaly Extremities: extremities normal, atraumatic, no cyanosis or edema Pulses: 2+ and symmetric Skin: Skin color, texture, turgor normal. No rashes or lesions Neurologic: Grossly normal Incision/Wound: na  Assessment/Plan We have discussed conservative vs. operative intervention.  She has elected to undergo surgical release of the right side.  The pre, peri and postoperative course were discussed along with the risks and complications.  The patient is aware there is no guarantee with the surgery, possibility of infection, recurrence, injury to arteries, nerves, tendons, incomplete relief of symptoms and dystrophy.  She would like to have the left side injected at the time of surgery  Daianna Vasques R 01/02/2014, 12:27 PM

## 2014-01-02 NOTE — Anesthesia Postprocedure Evaluation (Signed)
  Anesthesia Post-op Note  Patient: Brooke KarvonenColleen F French  Procedure(s) Performed: Procedure(s): RIGHT CARPAL TUNNEL RELEASE (Right) STEROID INJECTION LEFT WRIST  (CARPAL TUNNEL) (Left)  Patient Location: PACU  Anesthesia Type:General  Level of Consciousness: awake and alert   Airway and Oxygen Therapy: Patient Spontanous Breathing  Post-op Pain: none  Post-op Assessment: Post-op Vital signs reviewed  Post-op Vital Signs: stable  Complications: No apparent anesthesia complications

## 2014-01-02 NOTE — Transfer of Care (Signed)
Immediate Anesthesia Transfer of Care Note  Patient: Brooke French  Procedure(s) Performed: Procedure(s): RIGHT CARPAL TUNNEL RELEASE (Right) STEROID INJECTION LEFT WRIST  (CARPAL TUNNEL) (Left)  Patient Location: PACU  Anesthesia Type:General  Level of Consciousness: awake, alert  and oriented  Airway & Oxygen Therapy: Patient Spontanous Breathing and Patient connected to nasal cannula oxygen  Post-op Assessment: Report given to PACU RN and Post -op Vital signs reviewed and stable  Post vital signs: Reviewed and stable  Complications: No apparent anesthesia complications

## 2014-01-02 NOTE — Op Note (Signed)
  Dictation Number [id number]

## 2014-01-02 NOTE — Anesthesia Procedure Notes (Signed)
Procedure Name: LMA Insertion Date/Time: 01/02/2014 3:29 PM Performed by: Zenia ResidesPAYNE, Alyiah Ulloa D Pre-anesthesia Checklist: Patient identified, Emergency Drugs available, Suction available and Patient being monitored Patient Re-evaluated:Patient Re-evaluated prior to inductionOxygen Delivery Method: Circle System Utilized Preoxygenation: Pre-oxygenation with 100% oxygen Intubation Type: IV induction Ventilation: Mask ventilation without difficulty LMA: LMA inserted LMA Size: 4.0 Number of attempts: 1 Airway Equipment and Method: bite block Placement Confirmation: positive ETCO2 Tube secured with: Tape Dental Injury: Teeth and Oropharynx as per pre-operative assessment

## 2014-01-03 NOTE — Op Note (Signed)
Brooke French, Brooke French             ACCOUNT NO.:  [id number]  MEDICAL RECORD NO.:  [id number]  LOCATION:                                 FACILITY:  PHYSICIAN:  Cindee Salt, M.D.            DATE OF BIRTH:  DATE OF PROCEDURE:  01/02/2014 DATE OF DISCHARGE:                              OPERATIVE REPORT   PREOPERATIVE DIAGNOSIS:  Carpal tunnel syndrome bilateral.  POSTOPERATIVE DIAGNOSIS:  Carpal tunnel syndrome bilateral.  OPERATION:  Decompression of right median nerve with injection left carpal canal with Celestone and Xylocaine.  SURGEON:  Cindee Salt, MD  ANESTHESIA:  General with local infiltration.  ANESTHESIOLOGIST:  Burna Forts, M.D.  HISTORY:  The patient is a 53 year old female with a history of bilateral carpal tunnel syndrome.  Nerve conduction is positive.  She has elected to undergo surgical decompression to the median nerve.  Pre, peri, and postoperative course have been discussed along with risks and complications.  She is aware that there is no guarantee with the surgery; possibility of infection; recurrence of injury to arteries, nerves, tendons; incomplete relief of symptoms, dystrophy.  In preoperative area, the patient is seen, the extremity marked by both patient and surgeon.  Antibiotic given.  PROCEDURE IN DETAIL:  The patient was brought to the operating room, where a general anesthetic was carried out without difficulty.  She was prepped using ChloraPrep, supine position, right arm free.  A 3-minute dry time was allowed.  Time-out taken, confirming the patient and procedure.  The limb was exsanguinated with an Esmarch bandage. Tourniquet placed high on the arm was inflated to 250 mmHg.  A longitudinal incision was made in the right palm, carried down through subcutaneous tissue.  Bleeders were electrocauterized.  Palmar fascia was split.  Superficial palmar arch identified.  Flexor tendon to the ring and little finger identified to the ulnar side  of the median nerve. The carpal retinaculum was incised with sharp dissection.  A right-angle and Sewall retractor were placed between skin and forearm fascia.  The fascia was released for approximately 2 cm proximal to the wrist crease under direct vision.  Canal was explored.  Air compression to the nerve was apparent.  Motor branch entered in the muscle.  Involvement of the nerve was present.  This was released proximally.  The wound was copiously irrigated with saline.  The skin then closed with interrupted 4-0 Vicryl Rapide sutures.  There was noted motor branch entered in the muscle.  No further lesions were noted.  A sterile compressive dressing was applied with the fingers free.  On deflation of the tourniquet, all fingers immediately pinked.  An injection with Celestone and Xylocaine were given to the left carpal canal on the ulnar side of the ulnar bursa in line with a ring finger.  This was done without complications.  A Band-Aid was applied.  The patient tolerated the procedure well, was taken to the recovery room for observation in satisfactory condition.  She will be discharged home to return to [facility] in 1 week on Vicodin.          ______________________________ Cindee Salt, M.D.  GK/MEDQ  D:  01/02/2014  T:  01/02/2014  Job:  [id number]

## 2014-01-05 ENCOUNTER — Encounter (HOSPITAL_BASED_OUTPATIENT_CLINIC_OR_DEPARTMENT_OTHER): Payer: Self-pay | Admitting: Orthopedic Surgery

## 2014-12-03 ENCOUNTER — Other Ambulatory Visit: Payer: Self-pay

## 2014-12-03 DIAGNOSIS — Z1231 Encounter for screening mammogram for malignant neoplasm of breast: Secondary | ICD-10-CM

## 2014-12-10 ENCOUNTER — Ambulatory Visit
Admission: RE | Admit: 2014-12-10 | Discharge: 2014-12-10 | Disposition: A | Discharge status: Home / Self Care | Payer: 59 | Source: Ambulatory Visit

## 2014-12-10 DIAGNOSIS — Z1231 Encounter for screening mammogram for malignant neoplasm of breast: Secondary | ICD-10-CM

## 2015-01-03 ENCOUNTER — Encounter (HOSPITAL_COMMUNITY): Payer: Self-pay | Admitting: *Deleted

## 2015-01-03 ENCOUNTER — Emergency Department (HOSPITAL_COMMUNITY)
Admission: EM | Admit: 2015-01-03 | Discharge: 2015-01-03 | Disposition: A | Discharge status: Home / Self Care | Payer: 59 | Source: Home / Self Care | Attending: Family Medicine | Admitting: Family Medicine

## 2015-01-03 ENCOUNTER — Emergency Department (INDEPENDENT_AMBULATORY_CARE_PROVIDER_SITE_OTHER): Payer: 59

## 2015-01-03 DIAGNOSIS — M940 Chondrocostal junction syndrome [Tietze]: Secondary | ICD-10-CM

## 2015-01-03 MED ORDER — HYDROCODONE-ACETAMINOPHEN 5-325 MG PO TABS: 1.0000 | ORAL_TABLET | Freq: Four times a day (QID) | ORAL | Status: DC | PRN

## 2015-01-03 MED ORDER — CELECOXIB 200 MG PO CAPS: 200.0000 mg | ORAL_CAPSULE | Freq: Two times a day (BID) | ORAL | Status: DC | PRN

## 2015-01-03 NOTE — ED Notes (Signed)
States was using a new machine at gym 5 days ago when she felt and heard a sudden "pop" in left medial chest; has had painful breathing since then.  Started 4 days ago with productive cough, difficulty sleeping due to pain and painful coughing.  Has tried an old oxycodone, Advil, and ice without relief.

## 2015-01-03 NOTE — Discharge Instructions (Signed)
Thank you for coming in today. Celebrex for pain as needed. Use hydrocodone as needed for severe pain or cough. Call or go to the emergency room if you get worse, have trouble breathing, have chest pains, or palpitations.   Costochondritis Costochondritis, sometimes called Tietze syndrome, is a swelling and irritation (inflammation) of the tissue (cartilage) that connects your ribs with your breastbone (sternum). It causes pain in the chest and rib area. Costochondritis usually goes away on its own over time. It can take up to 6 weeks or longer to get better, especially if you are unable to limit your activities. CAUSES  Some cases of costochondritis have no known cause. Possible causes include:  Injury (trauma).  Exercise or activity such as lifting.  Severe coughing. SIGNS AND SYMPTOMS  Pain and tenderness in the chest and rib area.  Pain that gets worse when coughing or taking deep breaths.  Pain that gets worse with specific movements. DIAGNOSIS  Your health care provider will do a physical exam and ask about your symptoms. Chest X-rays or other tests may be done to rule out other problems. TREATMENT  Costochondritis usually goes away on its own over time. Your health care provider may prescribe medicine to help relieve pain. HOME CARE INSTRUCTIONS   Avoid exhausting physical activity. Try not to strain your ribs during normal activity. This would include any activities using chest, abdominal, and side muscles, especially if heavy weights are used.  Apply ice to the affected area for the first 2 days after the pain begins.  Put ice in a plastic bag.  Place a towel between your skin and the bag.  Leave the ice on for 20 minutes, 2-3 times a day.  Only take over-the-counter or prescription medicines as directed by your health care provider. SEEK MEDICAL CARE IF:  You have redness or swelling at the rib joints. These are signs of infection.  Your pain does not go away  despite rest or medicine. SEEK IMMEDIATE MEDICAL CARE IF:   Your pain increases or you are very uncomfortable.  You have shortness of breath or difficulty breathing.  You cough up blood.  You have worse chest pains, sweating, or vomiting.  You have a fever or persistent symptoms for more than 2-3 days.  You have a fever and your symptoms suddenly get worse. MAKE SURE YOU:   Understand these instructions.  Will watch your condition.  Will get help right away if you are not doing well or get worse. Document Released: 07/12/2005 Document Revised: 07/23/2013 Document Reviewed: 05/06/2013 [facility] Patient Information 2015 [place], MarylandLLC. This information is not intended to replace advice given to you by your health care provider. Make sure you discuss any questions you have with your health care provider.

## 2015-01-03 NOTE — ED Provider Notes (Signed)
Brooke French is a 54 y.o. female who presents to Urgent Care today for sternum pain. Patient was working out in the gym earlier this week.  He was doing a Pap crunching machine when she felt a pop in her sternum when she started the exercise. Since then she's had pain in the left anterior chest wall especially with deep breathing, cough, and motion of her arms. She denies any exertional pain. She's tried ibuprofen as well as an old left of her oxycodone which helped some. She also notes new onset of some runny nose and coughing congestion recently. No fevers or chills vomiting or diarrhea.   Past Medical History  Diagnosis Date  . Hypothyroidism   . Depression   . Anxiety   . ACL tear 10/2011    left   Past Surgical History  Procedure Laterality Date  . 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 Endoscopic plantar fasciotomy  01/22/2003 - left; 06/29/2000 - right  . Pubovaginal sling  09/08/2002  . Vaginal hysterectomy  09/09/2002  . Cesarean section  1996  . Breast reduction surgery  1994  . Knee arthroscopy  2013    left  . Carpal tunnel release Right 01/02/2014    Procedure: RIGHT CARPAL TUNNEL RELEASE;  Surgeon: Nicki ReaperGary R Kuzma, MD;  Location: [facility];  Service: Orthopedics;  Laterality: Right;  . Steriod injection Left 01/02/2014    Procedure: STEROID INJECTION LEFT WRIST  (CARPAL TUNNEL);  Surgeon: Nicki ReaperGary R Kuzma, MD;  Location: [facility];  Service: Orthopedics;  Laterality: Left;   History  Substance Use Topics  . Smoking status: Former Games developermoker  . Smokeless tobacco: Never Used     Comment: quit smoking in 2005  . Alcohol Use: Yes     Comment: occasionally   ROS as above Medications: No current facility-administered medications for this encounter.   Current Outpatient Prescriptions  Medication Sig Dispense Refill  . Biotin 10 MG TABS Take by mouth.    Marland Kitchen. FLUoxetine (PROZAC) 40 MG capsule Take 40 mg by mouth daily.    Marland Kitchen. levothyroxine (SYNTHROID,  LEVOTHROID) 137 MCG tablet Take 137 mcg by mouth daily.    . celecoxib (CELEBREX) 200 MG capsule Take 1 capsule (200 mg total) by mouth 2 (two) times daily as needed. 30 capsule 0  . docusate sodium (COLACE) 100 MG capsule Take 100 mg by mouth 2 (two) times daily.    Marland Kitchen. HYDROcodone-acetaminophen (NORCO/VICODIN) 5-325 MG per tablet Take 1 tablet by mouth every 6 (six) hours as needed. 15 tablet 0  . Multiple Vitamins-Minerals (MULTIVITAMIN WITH MINERALS) tablet Take 1 tablet by mouth daily.     No Known Allergies   Exam:  BP 127/76 mmHg  Pulse 81  Temp(Src) 99.4 F (37.4 C) (Oral)  Resp 18  SpO2 96% Gen: Well NAD HEENT: EOMI,  MMM Lungs: Normal work of breathing. CTABL Heart: RRR no MRG Chest wall: Tender palpation left anterior chest wall. Abd: NABS, Soft. Nondistended, Nontender Exts: Brisk capillary refill, warm and well perfused.   No results found for this or any previous visit (from the past 24 hour(s)). Dg Chest 2 View  01/03/2015   CLINICAL DATA:  Cough  EXAM: CHEST  2 VIEW  COMPARISON:  10/06/2004  FINDINGS: The heart size and mediastinal contours are within normal limits. Both lungs are clear. The visualized skeletal structures are unremarkable.  IMPRESSION: No active cardiopulmonary disease.   Electronically Signed   By: Eulah PontMark  Lukens M.D.  On: 01/03/2015 13:17    Assessment and Plan: 54 y.o. female with costochondritis. Treat with NSAIDs and Norco. Follow-up with PCP.  Discussed warning signs or symptoms. Please see discharge instructions. Patient expresses understanding.     Rodolph Bong, MD 01/03/15 1341

## 2015-01-09 ENCOUNTER — Encounter (HOSPITAL_COMMUNITY): Payer: Self-pay | Admitting: Nurse Practitioner

## 2015-01-09 ENCOUNTER — Emergency Department (HOSPITAL_COMMUNITY)
Admission: EM | Admit: 2015-01-09 | Discharge: 2015-01-09 | Disposition: A | Discharge status: Home / Self Care | Payer: 59 | Attending: Emergency Medicine | Admitting: Emergency Medicine

## 2015-01-09 ENCOUNTER — Emergency Department (HOSPITAL_COMMUNITY): Payer: 59

## 2015-01-09 DIAGNOSIS — R0602 Shortness of breath: Secondary | ICD-10-CM | POA: Diagnosis not present

## 2015-01-09 DIAGNOSIS — J3489 Other specified disorders of nose and nasal sinuses: Secondary | ICD-10-CM | POA: Diagnosis not present

## 2015-01-09 DIAGNOSIS — F419 Anxiety disorder, unspecified: Secondary | ICD-10-CM | POA: Diagnosis not present

## 2015-01-09 DIAGNOSIS — E039 Hypothyroidism, unspecified: Secondary | ICD-10-CM | POA: Diagnosis not present

## 2015-01-09 DIAGNOSIS — R059 Cough, unspecified: Secondary | ICD-10-CM

## 2015-01-09 DIAGNOSIS — Z87891 Personal history of nicotine dependence: Secondary | ICD-10-CM | POA: Insufficient documentation

## 2015-01-09 DIAGNOSIS — Z87828 Personal history of other (healed) physical injury and trauma: Secondary | ICD-10-CM | POA: Diagnosis not present

## 2015-01-09 DIAGNOSIS — F329 Major depressive disorder, single episode, unspecified: Secondary | ICD-10-CM | POA: Diagnosis not present

## 2015-01-09 DIAGNOSIS — Z79899 Other long term (current) drug therapy: Secondary | ICD-10-CM | POA: Diagnosis not present

## 2015-01-09 DIAGNOSIS — R079 Chest pain, unspecified: Secondary | ICD-10-CM | POA: Diagnosis not present

## 2015-01-09 DIAGNOSIS — R05 Cough: Secondary | ICD-10-CM | POA: Insufficient documentation

## 2015-01-09 MED ORDER — PREDNISONE 50 MG PO TABS: 50.0000 mg | ORAL_TABLET | Freq: Every day | ORAL | Status: DC

## 2015-01-09 MED ORDER — ALBUTEROL SULFATE HFA 108 (90 BASE) MCG/ACT IN AERS
4.0000 | INHALATION_SPRAY | Freq: Once | RESPIRATORY_TRACT | Status: AC
  Administered 2015-01-09: 4 via RESPIRATORY_TRACT
  Filled 2015-01-09: qty 6.7

## 2015-01-09 MED ORDER — AZITHROMYCIN 250 MG PO TABS: ORAL_TABLET | ORAL | Status: DC

## 2015-01-09 NOTE — ED Provider Notes (Signed)
CSN: [id number]     Arrival date & time 01/09/15  1250 History   First MD Initiated Contact with Patient 01/09/15 1648     Chief Complaint  Patient presents with  . Cough     (Consider location/radiation/quality/duration/timing/severity/associated sxs/prior Treatment) HPI  Brooke French is a 54 year old female with past medical history of hypothyroid thyroidism depression anxiety,  gastric bypass. She comes in today with him 2 weeks now of cough and sinus drainage she's also been having these intermittent chest pains within her migratory sometimes on the right side of her chest, sometimes on the left side the chest, pains made worse with movement, better with rest, worse when she coughs. She's been continued on this cough which has been minimally productive, also having a lot of runny nose, no vomiting, no diarrhea. Is no history of hypertension, hyperlipidemia, diabetes, heart disease.  Past Medical History  Diagnosis Date  . Hypothyroidism   . Depression   . Anxiety   . ACL tear 10/2011    left   Past Surgical History  Procedure Laterality Date  . 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 Endoscopic plantar fasciotomy  01/22/2003 - left; 06/29/2000 - right  . Pubovaginal sling  09/08/2002  . Vaginal hysterectomy  09/09/2002  . Cesarean section  1996  . Breast reduction surgery  1994  . Knee arthroscopy  2013    left  . Carpal tunnel release Right 01/02/2014    Procedure: RIGHT CARPAL TUNNEL RELEASE;  Surgeon: Nicki ReaperGary R Kuzma, MD;  Location: [facility];  Service: Orthopedics;  Laterality: Right;  . Steriod injection Left 01/02/2014    Procedure: STEROID INJECTION LEFT WRIST  (CARPAL TUNNEL);  Surgeon: Nicki ReaperGary R Kuzma, MD;  Location: [facility];  Service: Orthopedics;  Laterality: Left;   History reviewed. No pertinent family history. History  Substance Use Topics  . Smoking status: Former Games developermoker  . Smokeless tobacco: Never Used     Comment: quit smoking in  2005  . Alcohol Use: Yes     Comment: occasionally   OB History    No data available     Review of Systems  Constitutional: Negative for fever and chills.  HENT: Positive for rhinorrhea. Negative for nosebleeds.   Eyes: Negative for visual disturbance.  Respiratory: Positive for cough and shortness of breath.   Cardiovascular: Positive for chest pain.  Gastrointestinal: Negative for nausea, vomiting, abdominal pain, diarrhea and constipation.  Genitourinary: Negative for dysuria.  Skin: Negative for rash.  Neurological: Negative for weakness.  All other systems reviewed and are negative.     Allergies  Review of patient's allergies indicates no known allergies.  Home Medications   Prior to Admission medications   Medication Sig Start Date End Date Taking? Authorizing Provider  Biotin 10 MG TABS Take 10 mg by mouth daily.    Yes Historical Provider, MD  celecoxib (CELEBREX) 200 MG capsule Take 1 capsule (200 mg total) by mouth 2 (two) times daily as needed. Patient taking differently: Take 200 mg by mouth 2 (two) times daily as needed for mild pain or moderate pain.  01/03/15  Yes Rodolph BongEvan S Corey, MD  docusate sodium (COLACE) 100 MG capsule Take 100 mg by mouth daily as needed for mild constipation or moderate constipation.    Yes Historical Provider, MD  FLUoxetine (PROZAC) 40 MG capsule Take 40 mg by mouth daily.   Yes Historical Provider, MD  HYDROcodone-acetaminophen (NORCO/VICODIN) 5-325 MG per tablet Take 1 tablet by mouth  every 6 (six) hours as needed. Patient taking differently: Take 1 tablet by mouth every 6 (six) hours as needed (cough).  01/03/15  Yes Rodolph Bong, MD  levothyroxine (SYNTHROID, LEVOTHROID) 137 MCG tablet Take 137 mcg by mouth daily.   Yes Historical Provider, MD  loratadine (CLARITIN) 10 MG tablet Take 10 mg by mouth daily.   Yes Historical Provider, MD  Multiple Vitamins-Minerals (MULTIVITAMIN WITH MINERALS) tablet Take 1 tablet by mouth daily.   Yes  Historical Provider, MD  sodium chloride (OCEAN) 0.65 % SOLN nasal spray Place 1 spray into both nostrils as needed for congestion.   Yes Historical Provider, MD  azithromycin (ZITHROMAX Z-PAK) 250 MG tablet Please take two tabs the first day and then 1 tab each day after that. 01/09/15   Silas Flood, MD  predniSONE (DELTASONE) 50 MG tablet Take 1 tablet (50 mg total) by mouth daily. 01/09/15   Ankit Nanavati, MD   BP 133/62 mmHg  Pulse 74  Temp(Src) 97.8 F (36.6 C) (Oral)  Resp 20  SpO2 94% Physical Exam  Constitutional: She is oriented to person, place, and time. No distress.  HENT:  Head: Normocephalic and atraumatic.  Eyes: EOM are normal. Pupils are equal, round, and reactive to light.  Neck: Normal range of motion. Neck supple.  Cardiovascular: Normal rate and intact distal pulses.   Pulmonary/Chest: No respiratory distress. She has rales (faint bilateral).  Abdominal: Soft. There is no tenderness.  Musculoskeletal: Normal range of motion.  Neurological: She is alert and oriented to person, place, and time.  Skin: No rash noted. She is not diaphoretic.  Psychiatric: She has a normal mood and affect.    ED Course  Procedures (including critical care time) Labs Review Labs Reviewed - No data to display  Imaging Review Dg Chest 2 View (if Patient Has Fever And/or Copd)  01/09/2015   CLINICAL DATA:  Two week history of productive cough  EXAM: CHEST  2 VIEW  COMPARISON:  January 03, 2015  FINDINGS: There is no edema or consolidation. The heart size and pulmonary vascularity are normal. No adenopathy. No bone lesions.  IMPRESSION: No edema or consolidation.   Electronically Signed   By: Bretta Bang III M.D.   On: 01/09/2015 13:54     EKG Interpretation   Date/Time:  Saturday January 09 2015 17:00:31 EDT Ventricular Rate:  70 PR Interval:  146 QRS Duration: 97 QT Interval:  390 QTC Calculation: 421 R Axis:   67 Text Interpretation:  Sinus rhythm Minimal ST depression,  inferior leads  No significant change since last tracing Confirmed by Rhunette Croft, MD, Janey Genta  [phone]) on 01/09/2015 5:35:43 PM      MDM   Final diagnoses:  Cough    Brooke French is a 54 year old female with past medical history of hypothyroid thyroidism depression anxiety,  gastric bypass. She comes in today with him 2 weeks now of cough and sinus drainage she's also been having these intermittent chest pains  Exam as above patient is afebrile, normal heart rate, normal respiratory rate, good blood pressure, satting well on room air. Lungs with faint crackles at the bases bilaterally.  EKG obtained no evidence of ischemia doubt ACS given the risk factors, and migratory chest pain. On chest x-ray shows no obvious consolidation. Patient's afebrile, nontoxic appearing. Doubt PE given the setting of this illness, no tachycardia, satting well on room air, no history of DVT PE, no leg swelling.  Given albuterol here in the department with mild improvement  in her symptoms. We'll place her on a Z-Pak given his prolonged course of illness possible mild bronchopneumonia.  I have discussed the results, Dx and Tx plan with the patient. They expressed understanding and agree with the plan and were told to return to ED with any worsening of condition or concern.    Disposition: Discharge  Condition: Good  Discharge Medication List as of 01/09/2015  5:46 PM    START taking these medications   Details  azithromycin (ZITHROMAX Z-PAK) 250 MG tablet Please take two tabs the first day and then 1 tab each day after that., Print        Follow Up: Geoffry Paradise, MD [address] [place] [address] [phone]  In 3 days    Pt seen in conjunction with Dr. Roanna Banning, MD 01/10/15 1604  Derwood Kaplan, MD 01/10/15 [phone]

## 2015-01-09 NOTE — Discharge Instructions (Signed)
Cough, Adult   A cough is a reflex. It helps you clear your throat and airways. A cough can help heal your body. A cough can last 2 or 3 weeks (acute) or may last more than 8 weeks (chronic). Some common causes of a cough can include an infection, allergy, or a cold.  HOME CARE  · Only take medicine as told by your doctor.  · If given, take your medicines (antibiotics) as told. Finish them even if you start to feel better.  · Use a cold steam vaporizer or humidifier in your home. This can help loosen thick spit (secretions).  · Sleep so you are almost sitting up (semi-upright). Use pillows to do this. This helps reduce coughing.  · Rest as needed.  · Stop smoking if you smoke.  GET HELP RIGHT AWAY IF:  · You have yellowish-white fluid (pus) in your thick spit.  · Your cough gets worse.  · Your medicine does not reduce coughing, and you are losing sleep.  · You cough up blood.  · You have trouble breathing.  · Your pain gets worse and medicine does not help.  · You have a fever.  MAKE SURE YOU:   · Understand these instructions.  · Will watch your condition.  · Will get help right away if you are not doing well or get worse.  Document Released: 06/15/2011 Document Revised: 02/16/2014 Document Reviewed: 06/15/2011  ExitCare® Patient Information ©2015 Exit[employer], LLC. This information is not intended to replace advice given to you by your health care provider. Make sure you discuss any questions you have with your health care provider.

## 2015-01-09 NOTE — ED Notes (Signed)
Pt reports 2 weeks ago she began to have sinus drainage and cough, [facility]. She was given hydrocodone cough syrup to take for symptoms. She has been taking that along with claritin, mucinex and advil as needed but her symptoms continue to get worse. She reports her cough is more frequent, non-productive and now she feels congested in her chest. She states she coughs so much sometimes that she can not catch her breath. She is A&Ox4, resp e/u, dry cough in triage

## 2015-02-18 ENCOUNTER — Other Ambulatory Visit (HOSPITAL_COMMUNITY): Payer: Self-pay | Admitting: Obstetrics and Gynecology

## 2015-02-22 LAB — CYTOLOGY - PAP

## 2015-02-23 ENCOUNTER — Other Ambulatory Visit (HOSPITAL_COMMUNITY): Payer: Self-pay | Admitting: Physical Medicine and Rehabilitation

## 2015-02-23 DIAGNOSIS — M545 Low back pain, unspecified: Secondary | ICD-10-CM

## 2015-02-23 DIAGNOSIS — G8929 Other chronic pain: Secondary | ICD-10-CM

## 2015-02-25 ENCOUNTER — Ambulatory Visit (HOSPITAL_COMMUNITY)
Admission: RE | Admit: 2015-02-25 | Discharge: 2015-02-25 | Disposition: A | Discharge status: Home / Self Care | Payer: 59 | Source: Ambulatory Visit | Attending: Physical Medicine and Rehabilitation | Admitting: Physical Medicine and Rehabilitation

## 2015-02-25 DIAGNOSIS — G8929 Other chronic pain: Secondary | ICD-10-CM | POA: Diagnosis not present

## 2015-02-25 DIAGNOSIS — M4316 Spondylolisthesis, lumbar region: Secondary | ICD-10-CM | POA: Diagnosis not present

## 2015-02-25 DIAGNOSIS — M545 Low back pain: Secondary | ICD-10-CM

## 2015-02-25 DIAGNOSIS — M4856XA Collapsed vertebra, not elsewhere classified, lumbar region, initial encounter for fracture: Secondary | ICD-10-CM | POA: Insufficient documentation

## 2015-05-17 DIAGNOSIS — M19011 Primary osteoarthritis, right shoulder: Secondary | ICD-10-CM

## 2015-05-17 DIAGNOSIS — M24111 Other articular cartilage disorders, right shoulder: Secondary | ICD-10-CM

## 2015-05-17 DIAGNOSIS — M7551 Bursitis of right shoulder: Secondary | ICD-10-CM

## 2015-05-17 DIAGNOSIS — M75100 Unspecified rotator cuff tear or rupture of unspecified shoulder, not specified as traumatic: Secondary | ICD-10-CM

## 2015-05-17 HISTORY — DX: Bursitis of right shoulder: M75.51

## 2015-05-17 HISTORY — DX: Unspecified rotator cuff tear or rupture of unspecified shoulder, not specified as traumatic: M75.100

## 2015-05-17 HISTORY — DX: Primary osteoarthritis, right shoulder: M19.011

## 2015-05-17 HISTORY — DX: Other articular cartilage disorders, right shoulder: M24.111

## 2015-05-20 ENCOUNTER — Other Ambulatory Visit (HOSPITAL_COMMUNITY): Payer: Self-pay | Admitting: Orthopedic Surgery

## 2015-05-20 DIAGNOSIS — M25511 Pain in right shoulder: Secondary | ICD-10-CM

## 2015-05-25 ENCOUNTER — Ambulatory Visit (HOSPITAL_COMMUNITY)
Admission: RE | Admit: 2015-05-25 | Discharge: 2015-05-25 | Disposition: A | Discharge status: Home / Self Care | Payer: 59 | Source: Ambulatory Visit | Attending: Orthopedic Surgery | Admitting: Orthopedic Surgery

## 2015-05-25 DIAGNOSIS — M7551 Bursitis of right shoulder: Secondary | ICD-10-CM | POA: Insufficient documentation

## 2015-05-25 DIAGNOSIS — M19011 Primary osteoarthritis, right shoulder: Secondary | ICD-10-CM | POA: Insufficient documentation

## 2015-05-25 DIAGNOSIS — M7581 Other shoulder lesions, right shoulder: Secondary | ICD-10-CM | POA: Insufficient documentation

## 2015-05-25 DIAGNOSIS — M25511 Pain in right shoulder: Secondary | ICD-10-CM | POA: Diagnosis present

## 2015-05-28 ENCOUNTER — Encounter (HOSPITAL_BASED_OUTPATIENT_CLINIC_OR_DEPARTMENT_OTHER): Payer: Self-pay | Admitting: *Deleted

## 2015-05-28 DIAGNOSIS — T07XXXA Unspecified multiple injuries, initial encounter: Secondary | ICD-10-CM

## 2015-05-28 HISTORY — DX: Unspecified multiple injuries, initial encounter: T07.XXXA

## 2015-06-01 ENCOUNTER — Other Ambulatory Visit: Payer: Self-pay | Admitting: Physician Assistant

## 2015-06-01 NOTE — H&P (Signed)
This is a pleasant 53 year-old female who presents to our clinic today with right shoulder pain.  She states this has been ongoing for the past two months.  No specific injury.  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  The pain she has been experiencing is located through her anterior shoulder.  She describes this as a constant toothache with occasional sharp pains.  Any sort of forward flexion or internal rotation movements seem to aggravate this most.  Using her mouse at work seems to be bothersome as well.  She has tried Advil with minimal relief of symptoms.  No radicular symptoms noted.  No history of Corticosteroid or surgical intervention on the right.  She does have a history of left shoulder subacromial injection several years back, which has helped her a great deal.   Past medical, social and family history reviewed in detail on the patient questionnaire and signed.  Review of systems: As detailed in HPI.  All others reviewed and are negative.  EXAMINATION: Well-developed, well-nourished female in no acute distress.  Alert and oriented x 3.  Examination of her right shoulder reveals full active range of motion in all directions.  She can internally rotate to L5, but this is somewhat painful.  Markedly positive empty can and cross body.  Negative drop arm.  She is neurovascularly intact distally.    X-RAYS: Type II acromion.  Marked degenerative changes AC joint.  Reasonable glenohumeral space.  IMPRESSION: Right shoulder impingement syndrome.    PLAN: Today we are proceeding with a 1:4 subacromial injection, as well as a Jobe exercise program.  We are hopeful this will settle things down and give Hamda great improvement.  If she is not any better over the next 2-3 weeks she will call and let us know and we will get an MRI to further delineate pathology.    PROCEDURE NOTE: The patient's clinical condition is marked by  substantial pain and/or significant functional disability.  Other conservative therapy has not provided relief, is contraindicated, or not appropriate.  There is a reasonable likelihood that injection will significantly improve the patient's pain and/or functional disability. Patient is seated on the exam table.  The right shoulder is prepped with Betadine and alcohol and injected with 20 mg of Depo-Medrol and 4 cc of Marcaine.  Patient tolerated the procedure without difficulty.  Daniel F. Murphy, M.D.   Addendum:  I went over Tamra's MRI with her.  I have looked at the report, as well as the scan.  She got some improvement with injection with Marcaine, but it is really not enough and not dramatic enough to make things tolerable.  MRI shows severe tendinopathy and probably a functional full thickness tear of the supraspinatus tendon.  Obviously not retracted.  In light of the continued symptoms and findings on MRI and the fact that this is not going to completely resolve we have discussed definitive treatment.  I think the sooner she does this the better.  She understands and agrees.  Full discussion about this.  Procedure, risks, benefits and complications reviewed.  Paperwork complete.  All questions answered.  Anticipate cuff repair and being in a sling for six weeks post-op.  She has promised to behave during the postoperative period.    Daniel F. Murphy, M.D.    

## 2015-06-02 ENCOUNTER — Other Ambulatory Visit: Payer: Self-pay | Admitting: Physician Assistant

## 2015-06-02 NOTE — H&P (Signed)
This is a pleasant 54 year-old female who presents to our clinic today with right shoulder pain.  She states this has been ongoing for the past two months.  No specific injury.  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  The pain she has been experiencing is located through her anterior shoulder.  She describes this as a constant toothache with occasional sharp pains.  Any sort of forward flexion or internal rotation movements seem to aggravate this most.  Using her mouse at work seems to be bothersome as well.  She has tried Advil with minimal relief of symptoms.  No radicular symptoms noted.  No history of Corticosteroid or surgical intervention on the right.  She does have a history of left shoulder subacromial injection several years back, which has helped her a great deal.   Past medical, social and family history reviewed in detail on the patient questionnaire and signed.  Review of systems: As detailed in HPI.  All others reviewed and are negative.  EXAMINATION: Well-developed, well-nourished female in no acute distress.  Alert and oriented x 3.  Examination of her right shoulder reveals full active range of motion in all directions.  She can internally rotate to L5, but this is somewhat painful.  Markedly positive empty can and cross body.  Negative drop arm.  She is neurovascularly intact distally.    X-RAYS: Type II acromion.  Marked degenerative changes AC joint.  Reasonable glenohumeral space.  IMPRESSION: Right shoulder impingement syndrome.    PLAN: Today we are proceeding with a 1:4 subacromial injection, as well as a Jobe exercise program.  We are hopeful this will settle things down and give Brooke French great improvement.  If she is not any better over the next 2-3 weeks she will call and let us know and we will get an MRI to further delineate pathology.    PROCEDURE NOTE: The patient's clinical condition is marked by  substantial pain and/or significant functional disability.  Other conservative therapy has not provided relief, is contraindicated, or not appropriate.  There is a reasonable likelihood that injection will significantly improve the patient's pain and/or functional disability. Patient is seated on the exam table.  The right shoulder is prepped with Betadine and alcohol and injected with 20 mg of Depo-Medrol and 4 cc of Marcaine.  Patient tolerated the procedure without difficulty.  Loreta Ave, M.D.   Addendum:  I went over Brooke French's MRI with her.  I have looked at the report, as well as the scan.  She got some improvement with injection with Marcaine, but it is really not enough and not dramatic enough to make things tolerable.  MRI shows severe tendinopathy and probably a functional full thickness tear of the supraspinatus tendon.  Obviously not retracted.  In light of the continued symptoms and findings on MRI and the fact that this is not going to completely resolve we have discussed definitive treatment.  I think the sooner she does this the better.  She understands and agrees.  Full discussion about this.  Procedure, risks, benefits and complications reviewed.  Paperwork complete.  All questions answered.  Anticipate cuff repair and being in a sling for six weeks post-op.  She has promised to behave during the postoperative period.    Loreta Ave, M.D.

## 2015-06-03 ENCOUNTER — Ambulatory Visit (HOSPITAL_BASED_OUTPATIENT_CLINIC_OR_DEPARTMENT_OTHER)
Admission: RE | Admit: 2015-06-03 | Discharge: 2015-06-03 | Disposition: A | Discharge status: Home / Self Care | Payer: 59 | Source: Ambulatory Visit | Attending: Orthopedic Surgery | Admitting: Orthopedic Surgery

## 2015-06-03 ENCOUNTER — Encounter (HOSPITAL_BASED_OUTPATIENT_CLINIC_OR_DEPARTMENT_OTHER)
Admission: RE | Disposition: A | Discharge status: Home / Self Care | Payer: Self-pay | Source: Ambulatory Visit | Attending: Orthopedic Surgery

## 2015-06-03 ENCOUNTER — Encounter (HOSPITAL_BASED_OUTPATIENT_CLINIC_OR_DEPARTMENT_OTHER): Payer: Self-pay | Admitting: Certified Registered"

## 2015-06-03 ENCOUNTER — Ambulatory Visit (HOSPITAL_BASED_OUTPATIENT_CLINIC_OR_DEPARTMENT_OTHER): Payer: 59 | Admitting: Certified Registered"

## 2015-06-03 DIAGNOSIS — M75101 Unspecified rotator cuff tear or rupture of right shoulder, not specified as traumatic: Secondary | ICD-10-CM | POA: Insufficient documentation

## 2015-06-03 DIAGNOSIS — F419 Anxiety disorder, unspecified: Secondary | ICD-10-CM | POA: Diagnosis not present

## 2015-06-03 DIAGNOSIS — M7541 Impingement syndrome of right shoulder: Secondary | ICD-10-CM | POA: Insufficient documentation

## 2015-06-03 DIAGNOSIS — E039 Hypothyroidism, unspecified: Secondary | ICD-10-CM | POA: Diagnosis not present

## 2015-06-03 DIAGNOSIS — Z90710 Acquired absence of both cervix and uterus: Secondary | ICD-10-CM | POA: Diagnosis not present

## 2015-06-03 DIAGNOSIS — Z87891 Personal history of nicotine dependence: Secondary | ICD-10-CM | POA: Insufficient documentation

## 2015-06-03 DIAGNOSIS — M7551 Bursitis of right shoulder: Secondary | ICD-10-CM | POA: Insufficient documentation

## 2015-06-03 DIAGNOSIS — F329 Major depressive disorder, single episode, unspecified: Secondary | ICD-10-CM | POA: Insufficient documentation

## 2015-06-03 DIAGNOSIS — Z9884 Bariatric surgery status: Secondary | ICD-10-CM | POA: Insufficient documentation

## 2015-06-03 DIAGNOSIS — M19011 Primary osteoarthritis, right shoulder: Secondary | ICD-10-CM | POA: Insufficient documentation

## 2015-06-03 DIAGNOSIS — Z79899 Other long term (current) drug therapy: Secondary | ICD-10-CM | POA: Insufficient documentation

## 2015-06-03 HISTORY — DX: Bursitis of right shoulder: M75.51

## 2015-06-03 HISTORY — DX: Dental restoration status: Z98.811

## 2015-06-03 HISTORY — DX: Other articular cartilage disorders, right shoulder: M24.111

## 2015-06-03 HISTORY — DX: Unspecified osteoarthritis, unspecified site: M19.90

## 2015-06-03 HISTORY — PX: RESECTION DISTAL CLAVICAL: SHX5053

## 2015-06-03 HISTORY — DX: Unspecified multiple injuries, initial encounter: T07.XXXA

## 2015-06-03 HISTORY — PX: SHOULDER ARTHROSCOPY WITH ROTATOR CUFF REPAIR AND SUBACROMIAL DECOMPRESSION: SHX5686

## 2015-06-03 HISTORY — DX: Unspecified rotator cuff tear or rupture of unspecified shoulder, not specified as traumatic: M75.100

## 2015-06-03 HISTORY — DX: Primary osteoarthritis, right shoulder: M19.011

## 2015-06-03 LAB — POCT HEMOGLOBIN-HEMACUE: HEMOGLOBIN: 14.1 g/dL (ref 12.0–15.0)

## 2015-06-03 SURGERY — SHOULDER ARTHROSCOPY WITH ROTATOR CUFF REPAIR AND SUBACROMIAL DECOMPRESSION
Anesthesia: Regional | Site: Shoulder | Laterality: Right

## 2015-06-03 MED ORDER — ONDANSETRON HCL 4 MG/2ML IJ SOLN
INTRAMUSCULAR | Status: DC | PRN
  Administered 2015-06-03: 4 mg via INTRAVENOUS

## 2015-06-03 MED ORDER — MIDAZOLAM HCL 2 MG/2ML IJ SOLN
1.0000 mg | INTRAMUSCULAR | Status: DC | PRN
  Administered 2015-06-03 (×2): 2 mg via INTRAVENOUS

## 2015-06-03 MED ORDER — EPHEDRINE SULFATE 50 MG/ML IJ SOLN
INTRAMUSCULAR | Status: DC | PRN
  Administered 2015-06-03 (×2): 10 mg via INTRAVENOUS

## 2015-06-03 MED ORDER — SUCCINYLCHOLINE CHLORIDE 20 MG/ML IJ SOLN
INTRAMUSCULAR | Status: DC | PRN
  Administered 2015-06-03: 120 mg via INTRAVENOUS

## 2015-06-03 MED ORDER — DEXAMETHASONE SODIUM PHOSPHATE 4 MG/ML IJ SOLN
INTRAMUSCULAR | Status: DC | PRN
  Administered 2015-06-03: 10 mg via INTRAVENOUS

## 2015-06-03 MED ORDER — CEFAZOLIN SODIUM-DEXTROSE 2-3 GM-% IV SOLR
2.0000 g | INTRAVENOUS | Status: AC
  Administered 2015-06-03: 2 g via INTRAVENOUS

## 2015-06-03 MED ORDER — LIDOCAINE HCL (CARDIAC) 20 MG/ML IV SOLN
INTRAVENOUS | Status: DC | PRN
  Administered 2015-06-03: 30 mg via INTRAVENOUS

## 2015-06-03 MED ORDER — SODIUM CHLORIDE 0.9 % IR SOLN
Status: DC | PRN
  Administered 2015-06-03: 21000 mL

## 2015-06-03 MED ORDER — LACTATED RINGERS IV SOLN
INTRAVENOUS | Status: DC
  Administered 2015-06-03 (×2): via INTRAVENOUS

## 2015-06-03 MED ORDER — LACTATED RINGERS IV SOLN: INTRAVENOUS | Status: DC

## 2015-06-03 MED ORDER — ONDANSETRON HCL 4 MG PO TABS: 4.0000 mg | ORAL_TABLET | Freq: Three times a day (TID) | ORAL | Status: DC | PRN

## 2015-06-03 MED ORDER — CEFAZOLIN SODIUM-DEXTROSE 2-3 GM-% IV SOLR
INTRAVENOUS | Status: AC
  Filled 2015-06-03: qty 50

## 2015-06-03 MED ORDER — PROPOFOL 10 MG/ML IV BOLUS
INTRAVENOUS | Status: DC | PRN
  Administered 2015-06-03: 150 mg via INTRAVENOUS

## 2015-06-03 MED ORDER — CHLORHEXIDINE GLUCONATE 4 % EX LIQD: 60.0000 mL | Freq: Once | CUTANEOUS | Status: DC

## 2015-06-03 MED ORDER — FENTANYL CITRATE (PF) 100 MCG/2ML IJ SOLN
INTRAMUSCULAR | Status: AC
  Filled 2015-06-03: qty 6

## 2015-06-03 MED ORDER — ROPIVACAINE HCL 5 MG/ML IJ SOLN
INTRAMUSCULAR | Status: DC | PRN
  Administered 2015-06-03: 20 mL via PERINEURAL

## 2015-06-03 MED ORDER — MIDAZOLAM HCL 2 MG/2ML IJ SOLN
INTRAMUSCULAR | Status: AC
  Filled 2015-06-03: qty 2

## 2015-06-03 MED ORDER — FENTANYL CITRATE (PF) 100 MCG/2ML IJ SOLN
INTRAMUSCULAR | Status: AC
Start: 2015-06-03 — End: 2015-06-03
  Filled 2015-06-03: qty 2

## 2015-06-03 MED ORDER — FENTANYL CITRATE (PF) 100 MCG/2ML IJ SOLN
50.0000 ug | INTRAMUSCULAR | Status: DC | PRN
  Administered 2015-06-03: 50 ug via INTRAVENOUS
  Administered 2015-06-03: 100 ug via INTRAVENOUS

## 2015-06-03 MED ORDER — GLYCOPYRROLATE 0.2 MG/ML IJ SOLN: 0.2000 mg | Freq: Once | INTRAMUSCULAR | Status: DC | PRN

## 2015-06-03 MED ORDER — OXYCODONE-ACETAMINOPHEN 7.5-325 MG PO TABS: ORAL_TABLET | ORAL | Status: DC

## 2015-06-03 MED ORDER — SCOPOLAMINE 1 MG/3DAYS TD PT72: 1.0000 | MEDICATED_PATCH | Freq: Once | TRANSDERMAL | Status: DC | PRN

## 2015-06-03 SURGICAL SUPPLY — 69 items
ANCHOR SUT BIO SW 4.75X19.1 (Anchor) ×6 IMPLANT
BENZOIN TINCTURE PRP APPL 2/3 (GAUZE/BANDAGES/DRESSINGS) IMPLANT
BLADE CUTTER GATOR 3.5 (BLADE) ×3 IMPLANT
BLADE CUTTER MENIS 5.5 (BLADE) IMPLANT
BLADE GREAT WHITE 4.2 (BLADE) ×2 IMPLANT
BLADE GREAT WHITE 4.2MM (BLADE) ×1
BLADE SURG 15 STRL LF DISP TIS (BLADE) ×1 IMPLANT
BLADE SURG 15 STRL SS (BLADE) ×2
BUR OVAL 6.0 (BURR) ×3 IMPLANT
CANNULA DRY DOC 8X75 (CANNULA) ×3 IMPLANT
CANNULA TWIST IN 8.25X7CM (CANNULA) IMPLANT
CLOSURE WOUND 1/2 X4 (GAUZE/BANDAGES/DRESSINGS)
DECANTER SPIKE VIAL GLASS SM (MISCELLANEOUS) IMPLANT
DRAPE STERI 35X30 U-POUCH (DRAPES) ×3 IMPLANT
DRAPE U-SHAPE 47X51 STRL (DRAPES) ×3 IMPLANT
DRAPE U-SHAPE 76X120 STRL (DRAPES) ×6 IMPLANT
DRSG PAD ABDOMINAL 8X10 ST (GAUZE/BANDAGES/DRESSINGS) ×6 IMPLANT
DURAPREP 26ML APPLICATOR (WOUND CARE) ×3 IMPLANT
ELECT MENISCUS 165MM 90D (ELECTRODE) ×6 IMPLANT
ELECT REM PT RETURN 9FT ADLT (ELECTROSURGICAL) ×3
ELECTRODE REM PT RTRN 9FT ADLT (ELECTROSURGICAL) ×1 IMPLANT
GAUZE SPONGE 4X4 12PLY STRL (GAUZE/BANDAGES/DRESSINGS) ×9 IMPLANT
GAUZE XEROFORM 1X8 LF (GAUZE/BANDAGES/DRESSINGS) ×3 IMPLANT
GLOVE BIOGEL PI IND STRL 7.0 (GLOVE) ×3 IMPLANT
GLOVE BIOGEL PI INDICATOR 7.0 (GLOVE) ×6
GLOVE ECLIPSE 6.5 STRL STRAW (GLOVE) ×3 IMPLANT
GLOVE ECLIPSE 7.0 STRL STRAW (GLOVE) ×3 IMPLANT
GLOVE SURG ORTHO 8.0 STRL STRW (GLOVE) ×3 IMPLANT
GOWN STRL REUS W/ TWL LRG LVL3 (GOWN DISPOSABLE) ×2 IMPLANT
GOWN STRL REUS W/ TWL XL LVL3 (GOWN DISPOSABLE) ×1 IMPLANT
GOWN STRL REUS W/TWL LRG LVL3 (GOWN DISPOSABLE) ×4
GOWN STRL REUS W/TWL XL LVL3 (GOWN DISPOSABLE) ×2
IV NS IRRIG 3000ML ARTHROMATIC (IV SOLUTION) ×24 IMPLANT
MANIFOLD NEPTUNE II (INSTRUMENTS) ×3 IMPLANT
NDL SUT 6 .5 CRC .975X.05 MAYO (NEEDLE) IMPLANT
NEEDLE MAYO TAPER (NEEDLE)
NEEDLE SCORPION MULTI FIRE (NEEDLE) ×3 IMPLANT
NS IRRIG 1000ML POUR BTL (IV SOLUTION) IMPLANT
PACK ARTHROSCOPY DSU (CUSTOM PROCEDURE TRAY) ×3 IMPLANT
PASSER SUT SWANSON 36MM LOOP (INSTRUMENTS) IMPLANT
PENCIL BUTTON HOLSTER BLD 10FT (ELECTRODE) ×3 IMPLANT
SET ARTHROSCOPY TUBING (MISCELLANEOUS) ×2
SET ARTHROSCOPY TUBING LN (MISCELLANEOUS) ×1 IMPLANT
SLEEVE SCD COMPRESS KNEE MED (MISCELLANEOUS) ×3 IMPLANT
SLING ARM IMMOBILIZER LRG (SOFTGOODS) ×3 IMPLANT
SLING ARM IMMOBILIZER MED (SOFTGOODS) IMPLANT
SLING ARM LRG ADULT FOAM STRAP (SOFTGOODS) IMPLANT
SLING ARM MED ADULT FOAM STRAP (SOFTGOODS) IMPLANT
SLING ARM XL FOAM STRAP (SOFTGOODS) IMPLANT
SPONGE LAP 4X18 X RAY DECT (DISPOSABLE) IMPLANT
STRIP CLOSURE SKIN 1/2X4 (GAUZE/BANDAGES/DRESSINGS) IMPLANT
SUCTION FRAZIER TIP 10 FR DISP (SUCTIONS) IMPLANT
SUT ETHIBOND 2 OS 4 DA (SUTURE) IMPLANT
SUT ETHILON 2 0 FS 18 (SUTURE) IMPLANT
SUT ETHILON 3 0 PS 1 (SUTURE) ×3 IMPLANT
SUT FIBERWIRE #2 38 T-5 BLUE (SUTURE)
SUT RETRIEVER MED (INSTRUMENTS) IMPLANT
SUT TIGER TAPE 7 IN WHITE (SUTURE) ×6 IMPLANT
SUT VIC AB 0 CT1 27 (SUTURE)
SUT VIC AB 0 CT1 27XBRD ANBCTR (SUTURE) IMPLANT
SUT VIC AB 2-0 SH 27 (SUTURE)
SUT VIC AB 2-0 SH 27XBRD (SUTURE) IMPLANT
SUT VIC AB 3-0 FS2 27 (SUTURE) IMPLANT
SUTURE FIBERWR #2 38 T-5 BLUE (SUTURE) IMPLANT
TAPE FIBER 2MM 7IN #2 BLUE (SUTURE) ×3 IMPLANT
TOWEL OR 17X24 6PK STRL BLUE (TOWEL DISPOSABLE) ×3 IMPLANT
TOWEL OR NON WOVEN STRL DISP B (DISPOSABLE) ×3 IMPLANT
WATER STERILE IRR 1000ML POUR (IV SOLUTION) ×3 IMPLANT
YANKAUER SUCT BULB TIP NO VENT (SUCTIONS) IMPLANT

## 2015-06-03 NOTE — Anesthesia Preprocedure Evaluation (Signed)
Anesthesia Evaluation  Patient identified by MRN, date of birth, ID band Patient awake    Reviewed: Allergy & Precautions, H&P , NPO status , Patient's Chart, lab work & pertinent test results  Airway Mallampati: I       Dental  (+) Teeth Intact   Pulmonary former smoker,  breath sounds clear to auscultation        Cardiovascular negative cardio ROS  Rhythm:Regular Rate:Normal     Neuro/Psych Anxiety Depression    GI/Hepatic negative GI ROS, Neg liver ROS, Reux en Y   Endo/Other  Hypothyroidism   Renal/GU      Musculoskeletal   Abdominal   Peds  Hematology   Anesthesia Other Findings   Reproductive/Obstetrics                             Anesthesia Physical  Anesthesia Plan  ASA: II  Anesthesia Plan: General and Regional   Post-op Pain Management: GA combined w/ Regional for post-op pain   Induction: Intravenous  Airway Management Planned: Oral ETT  Additional Equipment:   Intra-op Plan:   Post-operative Plan: Extubation in OR  Informed Consent: I have reviewed the patients History and Physical, chart, labs and discussed the procedure including the risks, benefits and alternatives for the proposed anesthesia with the patient or authorized representative who has indicated his/her understanding and acceptance.   Dental advisory given  Plan Discussed with: Surgeon, CRNA and Anesthesiologist  Anesthesia Plan Comments:         Anesthesia Quick Evaluation

## 2015-06-03 NOTE — Progress Notes (Signed)
Assisted Dr Gentry Roch with right, ultrasound guided, interscalene  block. Side rails up, monitors on throughout procedure. See vital signs in flow sheet. Tolerated Procedure well.

## 2015-06-03 NOTE — Transfer of Care (Signed)
Immediate Anesthesia Transfer of Care Note  Patient: Brooke French  Procedure(s) Performed: Procedure(s): RIGHT SHOULDER ARTHROSCOPY WITH DISTAL CLAVICLE EXCISION, ACROMIOPLASTY, ROTATOR CUFF REPAIR AND BICEP TENODESIS (Right)  Patient Location: PACU  Anesthesia Type:GA combined with regional for post-op pain  Level of Consciousness: awake, alert , oriented and patient cooperative  Airway & Oxygen Therapy: Patient Spontanous Breathing and Patient connected to face mask oxygen  Post-op Assessment: Report given to RN and Post -op Vital signs reviewed and stable  Post vital signs: Reviewed and stable  Last Vitals:  Filed Vitals:   06/03/15 1345  BP: 125/71  Pulse: 64  Temp:   Resp: 17    Complications: No apparent anesthesia complications

## 2015-06-03 NOTE — Anesthesia Postprocedure Evaluation (Signed)
  Anesthesia Post-op Note  Patient: Brooke French  Procedure(s) Performed: Procedure(s) (LRB): SHOULDER ARTHROSCOPY WITH ROTATOR CUFF REPAIR AND SUBACROMIAL DECOMPRESSION (Right) RESECTION DISTAL CLAVICAL (Right)  Patient Location: PACU  Anesthesia Type: General  Level of Consciousness: awake and alert   Airway and Oxygen Therapy: Patient Spontanous Breathing  Post-op Pain: mild  Post-op Assessment: Post-op Vital signs reviewed, Patient's Cardiovascular Status Stable, Respiratory Function Stable, Patent Airway and No signs of Nausea or vomiting  Last Vitals:  Filed Vitals:   06/03/15 1617  BP:   Pulse: 88  Temp:   Resp: 36    Post-op Vital Signs: stable   Complications: No apparent anesthesia complications

## 2015-06-03 NOTE — Anesthesia Procedure Notes (Addendum)
Anesthesia Regional Block:  Interscalene brachial plexus block  Pre-Anesthetic Checklist: ,, timeout performed, Correct Patient, Correct Site, Correct Laterality, Correct Procedure, Correct Position, site marked, Risks and benefits discussed,  Surgical consent,  Pre-op evaluation,  At surgeon's request and post-op pain management  Laterality: Right  Prep: chloraprep       Needles:  Injection technique: Single-shot  Needle Type: Stimiplex     Needle Length: 5cm 5 cm Needle Gauge: 21 and 21 G    Additional Needles:  Procedures: ultrasound guided (picture in chart) and nerve stimulator Interscalene brachial plexus block Narrative:  Injection made incrementally with aspirations every 5 mL.  Performed by: Personally  Anesthesiologist: JUDD, BENJAMIN  Additional Notes: Risks, benefits and alternative to block explained extensively.  Patient tolerated procedure well, without complications.   Procedure Name: Intubation Date/Time: 06/03/2015 2:38 PM Performed by: Marvelle Span D Pre-anesthesia Checklist: Patient identified, Emergency Drugs available, Suction available and Patient being monitored Patient Re-evaluated:Patient Re-evaluated prior to inductionOxygen Delivery Method: Circle System Utilized Preoxygenation: Pre-oxygenation with 100% oxygen Intubation Type: IV induction Ventilation: Mask ventilation without difficulty Laryngoscope Size: Mac and 3 Grade View: Grade I Tube type: Oral Tube size: 7.0 mm Number of attempts: 1 Airway Equipment and Method: Stylet and Oral airway Placement Confirmation: ETT inserted through vocal cords under direct vision,  positive ETCO2 and breath sounds checked- equal and bilateral Secured at: 21 cm Tube secured with: Tape Dental Injury: Teeth and Oropharynx as per pre-operative assessment

## 2015-06-03 NOTE — Discharge Instructions (Signed)
Shouder arthroscopy, rotator cuff repair, subacromial decompression °Care After Instructions °Refer to this sheet in the next few weeks. These discharge instructions provide you with general information on caring for yourself after you leave the hospital. Your caregiver may also give you specific instructions. Your treatment has been planned according to the most current medical practices available, but unavoidable complications sometimes occur. If you have any problems or questions after discharge, please call your caregiver. °HOME INSTRUCTIONS °You may resume a normal diet and activities as directed.  °Take showers instead of baths until informed otherwise.  °Change bandages (dressings) in 3 days.  Swab wounds daily with betadine.  Wash shoulder with soap and water.  Pat dry.  Cover wounds with bandaids. °Only take over-the-counter or prescription medicines for pain, discomfort, or fever as directed by your caregiver.  °Wear your sling for the next 6 weeks unless otherwise instructed. °Eat a well-balanced diet.  °Avoid lifting or driving until you are instructed otherwise.  °Make an appointment to see your caregiver for stitches (suture) or staple removal one week after surgery.  ° °SEEK MEDICAL CARE IF: °You have swelling of your calf or leg.  °You develop shortness of breath or chest pain.  °You have redness, swelling, or increasing pain in the wound.  °There is pus or any unusual drainage coming from the surgical site.  °You notice a bad smell coming from the surgical site or dressing.  °The surgical site breaks open after sutures or staples have been removed.  °There is persistent bleeding from the suture or staple line.  °You are getting worse or are not improving.  °You have any other questions or concerns.  °SEEK IMMEDIATE MEDICAL CARE IF:  °You have a fever greater than 101 °You develop a rash.  °You have difficulty breathing.  °You develop any reaction or side effects to medicines given.  °Your knee  motion is decreasing rather than improving.  °MAKE SURE YOU:  °Understand these instructions.  °Will watch your condition.  °Will get help right away if you are not doing well or get worse.  ° ° °Post Anesthesia Home Care Instructions ° °Activity: °Get plenty of rest for the remainder of the day. A responsible adult should stay with you for 24 hours following the procedure.  °For the next 24 hours, DO NOT: °-Drive a car °-Operate machinery °-Drink alcoholic beverages °-Take any medication unless instructed by your physician °-Make any legal decisions or sign important papers. ° °Meals: °Start with liquid foods such as gelatin or soup. Progress to regular foods as tolerated. Avoid greasy, spicy, heavy foods. If nausea and/or vomiting occur, drink only clear liquids until the nausea and/or vomiting subsides. Call your physician if vomiting continues. ° °Special Instructions/Symptoms: °Your throat may feel dry or sore from the anesthesia or the breathing tube placed in your throat during surgery. If this causes discomfort, gargle with warm salt water. The discomfort should disappear within 24 hours. ° °If you had a scopolamine patch placed behind your ear for the management of post- operative nausea and/or vomiting: ° °1. The medication in the patch is effective for 72 hours, after which it should be removed.  Wrap patch in a tissue and discard in the trash. Wash hands thoroughly with soap and water. °2. You may remove the patch earlier than 72 hours if you experience unpleasant side effects which may include dry mouth, dizziness or visual disturbances. °3. Avoid touching the patch. Wash your hands with soap and water after contact with   the patch. °  °Regional Anesthesia Blocks ° °1. Numbness or the inability to move the "blocked" extremity may last from 3-48 hours after placement. The length of time depends on the medication injected and your individual response to the medication. If the numbness is not going away  after 48 hours, call your surgeon. ° °2. The extremity that is blocked will need to be protected until the numbness is gone and the  Strength has returned. Because you cannot feel it, you will need to take extra care to avoid injury. Because it may be weak, you may have difficulty moving it or using it. You may not know what position it is in without looking at it while the block is in effect. ° °3. For blocks in the legs and feet, returning to weight bearing and walking needs to be done carefully. You will need to wait until the numbness is entirely gone and the strength has returned. You should be able to move your leg and foot normally before you try and bear weight or walk. You will need someone to be with you when you first try to ensure you do not fall and possibly risk injury. ° °4. Bruising and tenderness at the needle site are common side effects and will resolve in a few days. ° °5. Persistent numbness or new problems with movement should be communicated to the surgeon or the [facility] ([phone])/ [facility] ([phone]). ° °

## 2015-06-03 NOTE — H&P (View-Only) (Signed)
This is a pleasant 54 year-old female who presents to our clinic today with right shoulder pain.  She states this has been ongoing for the past two months.  No specific injury.  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  The pain she has been experiencing is located through her anterior shoulder.  She describes this as a constant toothache with occasional sharp pains.  Any sort of forward flexion or internal rotation movements seem to aggravate this most.  Using her mouse at work seems to be bothersome as well.  She has tried Advil with minimal relief of symptoms.  No radicular symptoms noted.  No history of Corticosteroid or surgical intervention on the right.  She does have a history of left shoulder subacromial injection several years back, which has helped her a great deal.   Past medical, social and family history reviewed in detail on the patient questionnaire and signed.  Review of systems: As detailed in HPI.  All others reviewed and are negative.  EXAMINATION: Well-developed, well-nourished female in no acute distress.  Alert and oriented x 3.  Examination of her right shoulder reveals full active range of motion in all directions.  She can internally rotate to L5, but this is somewhat painful.  Markedly positive empty can and cross body.  Negative drop arm.  She is neurovascularly intact distally.    X-RAYS: Type II acromion.  Marked degenerative changes AC joint.  Reasonable glenohumeral space.  IMPRESSION: Right shoulder impingement syndrome.    PLAN: Today we are proceeding with a 1:4 subacromial injection, as well as a Jobe exercise program.  We are hopeful this will settle things down and give Tilia great improvement.  If she is not any better over the next 2-3 weeks she will call and let us know and we will get an MRI to further delineate pathology.    PROCEDURE NOTE: The patient's clinical condition is marked by  substantial pain and/or significant functional disability.  Other conservative therapy has not provided relief, is contraindicated, or not appropriate.  There is a reasonable likelihood that injection will significantly improve the patient's pain and/or functional disability. Patient is seated on the exam table.  The right shoulder is prepped with Betadine and alcohol and injected with 20 mg of Depo-Medrol and 4 cc of Marcaine.  Patient tolerated the procedure without difficulty.  Daniel F. Murphy, M.D.   Addendum:  I went over Kirstine's MRI with her.  I have looked at the report, as well as the scan.  She got some improvement with injection with Marcaine, but it is really not enough and not dramatic enough to make things tolerable.  MRI shows severe tendinopathy and probably a functional full thickness tear of the supraspinatus tendon.  Obviously not retracted.  In light of the continued symptoms and findings on MRI and the fact that this is not going to completely resolve we have discussed definitive treatment.  I think the sooner she does this the better.  She understands and agrees.  Full discussion about this.  Procedure, risks, benefits and complications reviewed.  Paperwork complete.  All questions answered.  Anticipate cuff repair and being in a sling for six weeks post-op.  She has promised to behave during the postoperative period.    Daniel F. Murphy, M.D.    

## 2015-06-03 NOTE — Interval H&P Note (Signed)
History and Physical Interval Note:  06/03/2015 8:04 AM  Brooke French  has presented today for surgery, with the diagnosis of other articular cartilage disorders, right shoulder, primary osteoarthritis, right shoulder, bursitis of right shoulder, complete rotator cuff tear or rupture not  The various methods of treatment have been discussed with the patient and family. After consideration of risks, benefits and other options for treatment, the patient has consented to  Procedure(s): RIGHT SHOULDER ARTHROSCOPY WITH DISTAL CLAVICLE EXCISION, ACROMIOPLASTY, ROTATOR CUFF REPAIR AND BICEP TENODESIS (Right) as a surgical intervention .  The patient's history has been reviewed, patient examined, no change in status, stable for surgery.  I have reviewed the patient's chart and labs.  Questions were answered to the patient's satisfaction.     Loreta Ave

## 2015-06-04 ENCOUNTER — Encounter (HOSPITAL_BASED_OUTPATIENT_CLINIC_OR_DEPARTMENT_OTHER): Payer: Self-pay | Admitting: Orthopedic Surgery

## 2015-06-07 NOTE — Op Note (Signed)
NAMEDALEXA, GENTZ NO.:  [id number]  MEDICAL RECORD NO.:  [id number]  LOCATION:                               FACILITY:  [facility]  PHYSICIAN:  Loreta Ave, M.D. DATE OF BIRTH:  1961-01-19  DATE OF PROCEDURE:  06/03/2015 DATE OF DISCHARGE:  06/03/2015                              OPERATIVE REPORT   PREOPERATIVE DIAGNOSES:  Right shoulder marked tendinopathy, probable full-thickness tear, rotator cuff, supraspinatus tendon.  Impingement. DJD AC joint.  Calcific bursitis.  POSTOPERATIVE DIAGNOSES:  Right shoulder marked tendinopathy, probable full-thickness tear, rotator cuff, supraspinatus tendon.  Impingement. DJD AC joint.  Calcific bursitis with also extensive tearing labrum.  PROCEDURES:  Right shoulder exam under anesthesia, arthroscopy. Debridement of labrum.  Debridement, mobilization of supraspinatus cuff tear.  Debridement of calcific deposits in the bursa on the top of the cuff, especially over infraspinatus.  Bursectomy, acromioplasty, CA ligament release.  Excision of distal clavicle.  Arthroscopic-assisted rotator cuff repair with FiberWire suture x2 swivel lock anchors x2.  SURGEON:  Loreta Ave, M.D.  ASSISTANT:  Mikey Kirschner, PA, who was present throughout the entire case and was necessary for timely completion of the procedure.  ANESTHESIA:  General.  BLOOD LOSS:  Minimal.  SPECIMENS:  None.  COMPLICATIONS:  None.  DRESSINGS:  Soft compressive shoulder immobilizer.  PROCEDURE IN DETAIL:  The patient was brought to operating room, placed on the operating table in supine position.  After adequate anesthesia had been obtained, placed in a beach-chair position on the shoulder positioner, prepped and draped in usual sterile fashion.  Full motion stable shoulder.  Three portals, anterior, posterior, and lateral. Arthroscope introduced, shoulder distended and inspected.  Articular cartilage looked good.  Circumferential  tearing labrum debrided.  Biceps tendon and biceps anchor intact.  From the bottom, the supraspinatus was very thin but that was not a complete tear.  Cannula redirected subacromially.  Extensive bursitis, calcific deposits, abrasive changes on the top of the cuff.  Basically, the supraspinatus had a functional full-thickness tear over the entire crescent.  This was debrided, mobilized, tuberosity roughened.  I then did an extensive debridement of all the calcific deposits on the top of the cuff and the bursa. Although there was a fair amount of change on the infraspinatus, where a lot of calcific deposits were there, no significant tears required repair there.  Acromioplasty from type 2 to type 1 acromion releasing CA ligament.  Grade 4 changes AC joint.  Periarticular spurs, lateral centimeter of clavicle resected.  Adequacy of debridement and decompression confirmed viewing from all portals.  I then placed a cannula laterally.  Mobilized, the cuff was captured with 2 horizontal mattress sutures utilizing a scorpion device.  Anchored down the roughened tuberosity with 2 SwiveLock anchors.  Nice firm repair achieved.  Instruments were removed.  Portals were closed with nylon.  Sterile compressive dressing applied.  Sling applied.  Anesthesia reversed.  Brought to the recovery room.  Tolerated the surgery well.  No complications.     Loreta Ave, M.D.     DFM/MEDQ  D:  06/04/2015  T:  06/04/2015  Job:  [id number]

## 2015-06-17 ENCOUNTER — Ambulatory Visit: Payer: 59 | Attending: Orthopedic Surgery | Admitting: Physical Therapy

## 2015-06-17 DIAGNOSIS — R293 Abnormal posture: Secondary | ICD-10-CM | POA: Insufficient documentation

## 2015-06-17 DIAGNOSIS — M25511 Pain in right shoulder: Secondary | ICD-10-CM | POA: Insufficient documentation

## 2015-06-17 DIAGNOSIS — M25611 Stiffness of right shoulder, not elsewhere classified: Secondary | ICD-10-CM

## 2015-06-17 DIAGNOSIS — R29898 Other symptoms and signs involving the musculoskeletal system: Secondary | ICD-10-CM | POA: Diagnosis present

## 2015-06-17 NOTE — Therapy (Signed)
[facility] [address] [place], [place], [address] Phone: [phone]   Fax:  [phone]  Physical Therapy Evaluation  Patient Details  Name: KARLIAH KOWALCHUK MRN: [id number] Date of Birth: 1961-08-13 Referring Provider:  Sheral Apley, MD  Encounter Date: 06/17/2015      PT End of Session - 06/17/15 1251    Visit Number 1   Number of Visits 16   Date for PT Re-Evaluation 08/12/15   PT Start Time 1145   PT Stop Time 1230   PT Time Calculation (min) 45 min   Activity Tolerance Patient tolerated treatment well   Behavior During Therapy [facility] for tasks assessed/performed      Past Medical History  Diagnosis Date  . Hypothyroidism   . Depression   . Anxiety   . Arthritis     ankle, back, knee, shoulder  . 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 Osteoarthritis of right shoulder region 05/2015  . Bursitis of right shoulder 05/2015  . Rotator cuff tear 05/2015    right shoulder  . Dental crowns present     x 2  . Abrasions of multiple sites 05/28/2015    right forearm, right lower leg    Past Surgical History  Procedure Laterality Date  . 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 Endoscopic plantar fasciotomy Right 06/29/2000  . Pubovaginal sling  09/08/2002  . Vaginal hysterectomy  09/09/2002  . Cesarean section  1996  . Breast reduction surgery  1994  . Knee arthroscopy w/ acl reconstruction Left 11/16/2011  . Carpal tunnel release Right 01/02/2014    Procedure: RIGHT CARPAL TUNNEL RELEASE;  Surgeon: Nicki Reaper, MD;  Location: [facility];  Service: Orthopedics;  Laterality: Right;  . Steriod injection Left 01/02/2014    Procedure: STEROID INJECTION LEFT WRIST  (CARPAL TUNNEL);  Surgeon: Nicki Reaper, MD;  Location: [facility];  Service: Orthopedics;  Laterality: Left;  . Upper gi endoscopy  10/18/2004  . Plantar fascia release Left 01/22/2003  . Shoulder arthroscopy  with rotator cuff repair and subacromial decompression Right 06/03/2015    Procedure: SHOULDER ARTHROSCOPY WITH ROTATOR CUFF REPAIR AND SUBACROMIAL DECOMPRESSION;  Surgeon: Loreta Ave, MD;  Location: [facility];  Service: Orthopedics;  Laterality: Right;  . Resection distal clavical Right 06/03/2015    Procedure: RESECTION DISTAL CLAVICAL;  Surgeon: Loreta Ave, MD;  Location: [facility];  Service: Orthopedics;  Laterality: Right;    There were no vitals filed for this visit.  Visit Diagnosis:  Right shoulder pain - Plan: PT plan of care cert/re-cert  Decreased right shoulder range of motion - Plan: PT plan of care cert/re-cert  Abnormal posture - Plan: PT plan of care cert/re-cert  Weakness of right arm - Plan: PT plan of care cert/re-cert      Subjective Assessment - 06/17/15 1155    Subjective pt is a 54 y.o F with S/p R rotator cuff repair and SAD/DCE on 06/03/2015. She reports since the surgery things have been going ok, the pain hasn't been as bad as she thought it would be.    Limitations Lifting;House hold activities;Writing  pt is R hand dominate   How long can you sit comfortably? unlimited   How long can you stand comfortably? qunlimited   How long can you walk comfortably? unlimited   Diagnostic tests 06/11/2015 X-ray per pt report everything looked good   Patient Stated Goals  to get back to normal, To be able to fish   Currently in Pain? Yes   Pain Score 2    Pain Location Shoulder   Pain Orientation Right   Pain Descriptors / Indicators Aching   Pain Type Surgical pain   Pain Onset 1 to 4 weeks ago   Pain Frequency Constant   Aggravating Factors  moving arm around   Pain Relieving Factors pain medication, ice            [facility] PT Assessment - 06/17/15 1159    Assessment   Medical Diagnosis R rotator cuff repair    Onset Date/Surgical Date --  06/03/2015   Hand Dominance Right   Next MD Visit 07/09/2015   Prior Therapy yes    Precautions   Precautions Shoulder   Type of Shoulder Precautions rotator cuff repair precutions/ restrictions   Shoulder Interventions Shoulder sling/immobilizer   Required Braces or Orthoses Sling   Restrictions   Weight Bearing Restrictions Yes   RUE Weight Bearing Non weight bearing   Balance Screen   Has the patient fallen in the past 6 months No   Has the patient had a decrease in activity level because of a fear of falling?  No   Is the patient reluctant to leave their home because of a fear of falling?  No   Home Environment   Living Environment Private residence   Living Arrangements Spouse/significant other;Children   Available Help at Discharge Available 24 hours/day;Available PRN/intermittently   Type of Home House   Home Access Stairs to enter   Entrance Stairs-Number of Steps 1   Home Layout One level   Home Equipment --  sling   Prior Function   Level of Independence Independent;Independent with basic ADLs   Vocation Full time Engineer, drilling for surgery   Vocation Requirements prolonged lifting, opening packages, carrying    Leisure fishing, reading, and hanging out with friends   Cognition   Overall Cognitive Status Within Functional Limits for tasks assessed   Observation/Other Assessments   Observations incision site appears clean and healing well.    Focus on Therapeutic Outcomes (FOTO)  65% limited  predicted 35% limited   Posture/Postural Control   Posture/Postural Control Postural limitations   Postural Limitations Rounded Shoulders;Forward head   ROM / Strength   AROM / PROM / Strength AROM;PROM;Strength   AROM   Overall AROM Comments L shoulder WNL   AROM Assessment Site Shoulder   Right/Left Shoulder Right;Left   PROM   PROM Assessment Site Shoulder   Right/Left Shoulder Right;Left   Right Shoulder Flexion 88 Degrees  pain and tightness at endrange of available motion   Right Shoulder ABduction 90 Degrees   Right Shoulder  Internal Rotation 20 Degrees   Right Shoulder External Rotation 8 Degrees   Strength   Strength Assessment Site Shoulder;Hand   Right/Left Shoulder Right;Left   Left Shoulder Flexion 4+/5   Left Shoulder Extension 4+/5   Left Shoulder ABduction 4+/5   Left Shoulder Internal Rotation 4+/5   Left Shoulder External Rotation 4+/5   Right/Left hand Right;Left   Right Hand Grip (lbs) 43   Left Hand Grip (lbs) 55   Palpation   Palpation comment Tenderness is palapated upon supraspinatus, and lateral/ anterior deltoid                           PT Education - 06/17/15 1248    Education provided  Yes   Education Details evaluation findings, POC, goals, HEP   Person(s) Educated Patient   Methods Explanation   Comprehension Verbalized understanding          PT Short Term Goals - 06/17/15 1258    PT SHORT TERM GOAL #1   Title pt will be I with initial HEP (07/17/2015)   Time 4   Period Weeks   Status New   PT SHORT TERM GOAL #2   Title pt will be able to > 90 degrees flexion/abduction AROM with < 4/10 pain to assist with functional progression  (07/17/2015)   Time 4   Period Weeks   Status New   PT SHORT TERM GOAL #3   Title pt will be able to demonstrate R shoulder strength >3/5 with < 4/10 pain to help with ADLS (07/17/2015)   PT SHORT TERM GOAL #4   Title pt will increase FOTO score by > 10 points to demonstrate increased function (07/17/2015)   Time 4   Period Weeks   Status New   PT SHORT TERM GOAL #5   Title pt will be able to verbalize and demonstrate techniques to reduce R shoulder pain/ reinjury via postural awareness, lifting and carrying mechanics, RICE, and HEP (07/17/2015)   Time 4   Period Weeks   Status New           PT Long Term Goals - 06/17/15 1302    PT LONG TERM GOAL #1   Title At discharge she will be I with all HEP given throughout therapy (08/12/2015)   Time 8   Period Weeks   Status New   PT LONG TERM GOAL #2   Title She will  increase R shoulder strenght to > 4-/5 to assist with job related acitvities (08/12/2015)   Time 8   Period Weeks   Status New   PT LONG TERM GOAL #3   Title pt will dmeonstrate R shoulder AROM to [facility] with <2/10 pain to assist with ADLs and personal goal of going fishing (08/12/2015)   Time 8   Period Weeks   Status New   PT LONG TERM GOAL #4   Title pt will be able to lift / carry > 5# overhead with < 2/10 pain to help with job related lifting and carrying activities (08/12/2015)   Time 8   Period Weeks   Status New   PT LONG TERM GOAL #5   Title She will increase her FOTO scorey to > 65 to demonstrate improved function at discharge   Time 8   Period Weeks   Status New               Plan - 06/17/15 1252    Clinical Impression Statement Allyce presents to OPPT S/P R Rotator cuff repair on 06/03/2015. She demonstrates limited shoulder PROM due to tightness. AROM and strength wasn't assessed due to protocols. incisions site looks clean and healing well. Tenderness is palapated upon supraspinatus, and lateral/ anterior deltoid. She would benefit from skilled physical therapy to decrease pain and increase function to with PLOF by addressing the impairments listed.    Pt will benefit from skilled therapeutic intervention in order to improve on the following deficits Improper body mechanics;Postural dysfunction;Pain;Hypomobility;Decreased strength;Increased edema;Decreased mobility;Decreased activity tolerance;Decreased endurance;Increased muscle spasms   Rehab Potential Good   PT Frequency 2x / week   PT Duration 8 weeks   PT Treatment/Interventions ADLs/Self Care Home Management;Electrical Stimulation;Iontophoresis 4mg /ml Dexamethasone;Cryotherapy;Moist Heat;Ultrasound;Therapeutic activities;Therapeutic exercise;Patient/family education;Manual techniques;Taping;Dry needling;Passive  range of motion   PT Next Visit Plan assess response to HEP, PROM of shoulder, modalities, begin wand  PROM exercises,    PT Home Exercise Plan pendulums, shoulder retraction, ball squeezes   Consulted and Agree with Plan of Care Patient         Problem List There are no active problems to display for this patient.  Lulu Riding PT, DPT, LAT, ATC  06/17/2015  1:17 PM      [facility] [address] [place], [place], [address] Phone: [phone]   Fax:  [phone]

## 2015-06-17 NOTE — Patient Instructions (Signed)
   Cyndra Feinberg PT, DPT, LAT, ATC  [facility] Phone: [phone]     

## 2015-06-23 ENCOUNTER — Ambulatory Visit: Payer: 59 | Admitting: Physical Therapy

## 2015-06-23 DIAGNOSIS — R293 Abnormal posture: Secondary | ICD-10-CM

## 2015-06-23 DIAGNOSIS — M25511 Pain in right shoulder: Secondary | ICD-10-CM

## 2015-06-23 DIAGNOSIS — M25611 Stiffness of right shoulder, not elsewhere classified: Secondary | ICD-10-CM

## 2015-06-23 NOTE — Therapy (Signed)
[place], [place], [address] Phone: [phone]   Fax:  [phone]  Physical Therapy Treatment  Patient Details  Name: Brooke French MRN: [id number] Date of Birth: 04/05/1961 Referring Provider:  Burnard Bunting, MD  Encounter Date: 06/23/2015      PT End of Session - 06/23/15 1319    Visit Number 2   Number of Visits 16   Date for PT Re-Evaluation 08/12/15   PT Start Time 0803   PT Stop Time 0900   PT Time Calculation (min) 57 min   Activity Tolerance Patient tolerated treatment well   Behavior During Therapy [facility] for tasks assessed/performed      Past Medical History  Diagnosis Date  . Hypothyroidism   . Depression   . Anxiety   . Arthritis     ankle, back, knee, shoulder  . 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 Osteoarthritis of right shoulder region 05/2015  . Bursitis of right shoulder 05/2015  . Rotator cuff tear 05/2015    right shoulder  . Dental crowns present     x 2  . Abrasions of multiple sites 05/28/2015    right forearm, right lower leg    Past Surgical History  Procedure Laterality Date  . 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 Endoscopic plantar fasciotomy Right 06/29/2000  . Pubovaginal sling  09/08/2002  . Vaginal hysterectomy  09/09/2002  . Cesarean section  1996  . Breast reduction surgery  1994  . Knee arthroscopy w/ acl reconstruction Left 11/16/2011  . Carpal tunnel release Right 01/02/2014    Procedure: RIGHT CARPAL TUNNEL RELEASE;  Surgeon: Wynonia Sours, MD;  Location: [place];  Service: Orthopedics;  Laterality: Right;  . Steriod injection Left 01/02/2014    Procedure: STEROID INJECTION LEFT WRIST  (CARPAL TUNNEL);  Surgeon: Wynonia Sours, MD;  Location: [place];  Service: Orthopedics;  Laterality: Left;  . Upper gi endoscopy  10/18/2004  . Plantar fascia release Left 01/22/2003  . Shoulder arthroscopy  with rotator cuff repair and subacromial decompression Right 06/03/2015    Procedure: SHOULDER ARTHROSCOPY WITH ROTATOR CUFF REPAIR AND SUBACROMIAL DECOMPRESSION;  Surgeon: Ninetta Lights, MD;  Location: [place] [facility];  Service: Orthopedics;  Laterality: Right;  . Resection distal clavical Right 06/03/2015    Procedure: RESECTION DISTAL CLAVICAL;  Surgeon: Ninetta Lights, MD;  Location: [place];  Service: Orthopedics;  Laterality: Right;    There were no vitals filed for this visit.  Visit Diagnosis:  Decreased right shoulder range of motion  Right shoulder pain  Abnormal posture      Subjective Assessment - 06/23/15 1318    Subjective 1/10  anterior shoulder.  Has been doing biceps curls.  doing Less codman's pendeulums.  She is being treated for mersa.                         [facility] PT Treatment/Exercise - 06/23/15 0805    Shoulder Exercises: Supine   External Rotation Limitations cane 10 X   Other Supine Exercises chest press 10 X emphasize PROM  cane   Other Supine Exercises scapular squeezes.  triceps 10  X AROM elbow supported ) LBS   Manual Therapy   Manual therapy comments ROM/P, gentle soft tissue work Rt shoulder, gloves used, over clothing,  gentle lymph stroking for edema  PT Short Term Goals - 06/23/15 1321    PT SHORT TERM GOAL #1   Title pt will be I with initial HEP (07/17/2015)   Baseline doing some of them,   Time 4   Period Weeks   Status On-going   PT SHORT TERM GOAL #2   Title pt will be able to > 90 degrees flexion/abduction AROM with < 4/10 pain to assist with functional progression  (07/17/2015)   Time 4   Period Weeks   Status On-going   PT SHORT TERM GOAL #3   Title pt will be able to demonstrate R shoulder strength >3/5 with < 4/10 pain to help with ADLS (66/0/6301)   Status Unable to assess   PT SHORT TERM GOAL #4   Title pt will increase FOTO score by > 10 points to  demonstrate increased function (07/17/2015)   Time 4   Period Weeks   Status Unable to assess   PT SHORT TERM GOAL #5   Title pt will be able to verbalize and demonstrate techniques to reduce R shoulder pain/ reinjury via postural awareness, lifting and carrying mechanics, RICE, and HEP (07/17/2015)   Time 4   Period Weeks   Status On-going           PT Long Term Goals - 06/17/15 1302    PT LONG TERM GOAL #1   Title At discharge she will be I with all HEP given throughout therapy (08/12/2015)   Time 8   Period Weeks   Status New   PT LONG TERM GOAL #2   Title She will increase R shoulder strenght to > 4-/5 to assist with job related acitvities (08/12/2015)   Time 8   Period Weeks   Status New   PT LONG TERM GOAL #3   Title pt will dmeonstrate R shoulder AROM to [facility] with <2/10 pain to assist with ADLs and personal goal of going fishing (08/12/2015)   Time 8   Period Weeks   Status New   PT LONG TERM GOAL #4   Title pt will be able to lift / carry > 5# overhead with < 2/10 pain to help with job related lifting and carrying activities (08/12/2015)   Time 8   Period Weeks   Status New   PT LONG TERM GOAL #5   Title She will increase her FOTO scorey to > 65 to demonstrate improved function at discharge   Time 8   Period Weeks   Status New               Plan - 06/23/15 1319    Clinical Impression Statement Precautions .  Patient is being treated for MRSA.  PROM and beginning cane  today.  No new goals met.   PT Next Visit Plan assess response to HEP, PROM of shoulder, modalities, begin wand PROM exercises,    PT Home Exercise Plan pendulums, shoulder retraction, ball squeezes   Consulted and Agree with Plan of Care Patient        Problem List There are no active problems to display for this patient.   [facility] 06/23/2015, 1:29 PM  [facility] [address] [place], [place], [address] Phone: [phone]    Fax:  [phone]     Melvenia Needles, PTA 06/23/2015 1:29 PM Phone: [phone] Fax: [phone]

## 2015-06-24 ENCOUNTER — Ambulatory Visit: Payer: 59 | Admitting: Physical Therapy

## 2015-06-24 DIAGNOSIS — M25511 Pain in right shoulder: Secondary | ICD-10-CM

## 2015-06-24 DIAGNOSIS — M25611 Stiffness of right shoulder, not elsewhere classified: Secondary | ICD-10-CM

## 2015-06-24 NOTE — Therapy (Signed)
[facility] [address] [place], [place], [address] Phone: [phone]   Fax:  [phone]  Physical Therapy Treatment  Patient Details  Name: Brooke French MRN: [id number] Date of Birth: 10-09-1961 Referring Provider:  Geoffry Paradise, MD  Encounter Date: 06/24/2015      PT End of Session - 06/24/15 0802    Visit Number 3   Number of Visits 16   Date for PT Re-Evaluation 08/12/15   PT Start Time 0802   PT Stop Time 0840   PT Time Calculation (min) 38 min   Activity Tolerance Patient tolerated treatment well   Behavior During Therapy [facility] for tasks assessed/performed      Past Medical History  Diagnosis Date  . Hypothyroidism   . Depression   . Anxiety   . Arthritis     ankle, back, knee, shoulder  . 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 Osteoarthritis of right shoulder region 05/2015  . Bursitis of right shoulder 05/2015  . Rotator cuff tear 05/2015    right shoulder  . Dental crowns present     x 2  . Abrasions of multiple sites 05/28/2015    right forearm, right lower leg    Past Surgical History  Procedure Laterality Date  . 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 Endoscopic plantar fasciotomy Right 06/29/2000  . Pubovaginal sling  09/08/2002  . Vaginal hysterectomy  09/09/2002  . Cesarean section  1996  . Breast reduction surgery  1994  . Knee arthroscopy w/ acl reconstruction Left 11/16/2011  . Carpal tunnel release Right 01/02/2014    Procedure: RIGHT CARPAL TUNNEL RELEASE;  Surgeon: Nicki Reaper, MD;  Location: [facility];  Service: Orthopedics;  Laterality: Right;  . Steriod injection Left 01/02/2014    Procedure: STEROID INJECTION LEFT WRIST  (CARPAL TUNNEL);  Surgeon: Nicki Reaper, MD;  Location: [facility];  Service: Orthopedics;  Laterality: Left;  . Upper gi endoscopy  10/18/2004  . Plantar fascia release Left 01/22/2003  . Shoulder arthroscopy  with rotator cuff repair and subacromial decompression Right 06/03/2015    Procedure: SHOULDER ARTHROSCOPY WITH ROTATOR CUFF REPAIR AND SUBACROMIAL DECOMPRESSION;  Surgeon: Loreta Ave, MD;  Location: [facility];  Service: Orthopedics;  Laterality: Right;  . Resection distal clavical Right 06/03/2015    Procedure: RESECTION DISTAL CLAVICAL;  Surgeon: Loreta Ave, MD;  Location: [facility];  Service: Orthopedics;  Laterality: Right;    There were no vitals filed for this visit.  Visit Diagnosis:  Decreased right shoulder range of motion  Right shoulder pain      Subjective Assessment - 06/24/15 0803    Subjective 1-2/10 anterior shoulder.    Currently in Pain? Yes   Pain Score 1    Pain Location Shoulder   Pain Orientation Right   Pain Descriptors / Indicators Aching   Pain Onset 1 to 4 weeks ago            [facility] PT Assessment - 06/24/15 0001    Precautions   Precautions Other (comment)   Precaution Comments MRSA                     [facility] Adult PT Treatment/Exercise - 06/24/15 0001    Shoulder Exercises: Supine   External Rotation PROM;Right;10 reps  x3 sets   Other Supine Exercises chest press 10 X emphasize PROM x 30  cane  Other Supine Exercises scapular retraction 5 sec hold x 10   Shoulder Exercises: Seated   Elevation Strengthening;Both;10 reps   Retraction Strengthening;Both;10 reps  combined with elevation (rolls)   Manual Therapy   Manual therapy comments PROM within  protocol restrictions                  PT Short Term Goals - 06/23/15 1321    PT SHORT TERM GOAL #1   Title pt will be I with initial HEP (07/17/2015)   Baseline doing some of them,   Time 4   Period Weeks   Status On-going   PT SHORT TERM GOAL #2   Title pt will be able to > 90 degrees flexion/abduction AROM with < 4/10 pain to assist with functional progression  (07/17/2015)   Time 4   Period Weeks   Status On-going   PT  SHORT TERM GOAL #3   Title pt will be able to demonstrate R shoulder strength >3/5 with < 4/10 pain to help with ADLS (07/17/2015)   Status Unable to assess   PT SHORT TERM GOAL #4   Title pt will increase FOTO score by > 10 points to demonstrate increased function (07/17/2015)   Time 4   Period Weeks   Status Unable to assess   PT SHORT TERM GOAL #5   Title pt will be able to verbalize and demonstrate techniques to reduce R shoulder pain/ reinjury via postural awareness, lifting and carrying mechanics, RICE, and HEP (07/17/2015)   Time 4   Period Weeks   Status On-going           PT Long Term Goals - 06/17/15 1302    PT LONG TERM GOAL #1   Title At discharge she will be I with all HEP given throughout therapy (08/12/2015)   Time 8   Period Weeks   Status New   PT LONG TERM GOAL #2   Title She will increase R shoulder strenght to > 4-/5 to assist with job related acitvities (08/12/2015)   Time 8   Period Weeks   Status New   PT LONG TERM GOAL #3   Title pt will dmeonstrate R shoulder AROM to [facility] with <2/10 pain to assist with ADLs and personal goal of going fishing (08/12/2015)   Time 8   Period Weeks   Status New   PT LONG TERM GOAL #4   Title pt will be able to lift / carry > 5# overhead with < 2/10 pain to help with job related lifting and carrying activities (08/12/2015)   Time 8   Period Weeks   Status New   PT LONG TERM GOAL #5   Title She will increase her FOTO scorey to > 65 to demonstrate improved function at discharge   Time 8   Period Weeks   Status New               Plan - 06/24/15 1538    Clinical Impression Statement Patient tolerated PROM without increased pain.        Problem List There are no active problems to display for this patient.   Solon Palm PT  06/24/2015, 3:40 PM  [facility] [address] [place], [place], [address] Phone: [phone]   Fax:  [phone]

## 2015-06-28 ENCOUNTER — Ambulatory Visit: Payer: 59 | Admitting: Physical Therapy

## 2015-06-28 DIAGNOSIS — R293 Abnormal posture: Secondary | ICD-10-CM

## 2015-06-28 DIAGNOSIS — M25511 Pain in right shoulder: Secondary | ICD-10-CM

## 2015-06-28 DIAGNOSIS — R29898 Other symptoms and signs involving the musculoskeletal system: Secondary | ICD-10-CM

## 2015-06-28 DIAGNOSIS — M25611 Stiffness of right shoulder, not elsewhere classified: Secondary | ICD-10-CM

## 2015-06-28 NOTE — Therapy (Signed)
[facility] [address] [place], [place], [address] Phone: [phone]   Fax:  [phone]  Physical Therapy Treatment  Patient Details  Name: CHRISTALYNN BOISE MRN: [id number] Date of Birth: [birth year] Referring Provider:  Geoffry Paradise, MD  Encounter Date: 06/28/2015      PT End of Session - 06/28/15 0849    Visit Number 4   Number of Visits 16   Date for PT Re-Evaluation 08/12/15   PT Start Time 0800   PT Stop Time 0903   PT Time Calculation (min) 63 min   Activity Tolerance Patient tolerated treatment well   Behavior During Therapy [facility] for tasks assessed/performed      Past Medical History  Diagnosis Date  . Hypothyroidism   . Depression   . Anxiety   . Arthritis     ankle, back, knee, shoulder  . 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 Osteoarthritis of right shoulder region 05/2015  . Bursitis of right shoulder 05/2015  . Rotator cuff tear 05/2015    right shoulder  . Dental crowns present     x 2  . Abrasions of multiple sites 05/28/2015    right forearm, right lower leg    Past Surgical History  Procedure Laterality Date  . 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 Endoscopic plantar fasciotomy Right 06/29/2000  . Pubovaginal sling  09/08/2002  . Vaginal hysterectomy  09/09/2002  . Cesarean section  1996  . Breast reduction surgery  1994  . Knee arthroscopy w/ acl reconstruction Left 11/16/2011  . Carpal tunnel release Right 01/02/2014    Procedure: RIGHT CARPAL TUNNEL RELEASE;  Surgeon: Nicki Reaper, MD;  Location: [facility];  Service: Orthopedics;  Laterality: Right;  . Steriod injection Left 01/02/2014    Procedure: STEROID INJECTION LEFT WRIST  (CARPAL TUNNEL);  Surgeon: Nicki Reaper, MD;  Location: [facility];  Service: Orthopedics;  Laterality: Left;  . Upper gi endoscopy  10/18/2004  . Plantar fascia release Left 01/22/2003  . Shoulder arthroscopy  with rotator cuff repair and subacromial decompression Right 06/03/2015    Procedure: SHOULDER ARTHROSCOPY WITH ROTATOR CUFF REPAIR AND SUBACROMIAL DECOMPRESSION;  Surgeon: Loreta Ave, MD;  Location: [facility];  Service: Orthopedics;  Laterality: Right;  . Resection distal clavical Right 06/03/2015    Procedure: RESECTION DISTAL CLAVICAL;  Surgeon: Loreta Ave, MD;  Location:  [facility];  Service: Orthopedics;  Laterality: Right;    There were no vitals filed for this visit.  Visit Diagnosis:  Decreased right shoulder range of motion  Right shoulder pain  Abnormal posture  Weakness of right arm      Subjective Assessment - 06/28/15 0805    Subjective pt reports the pain hasn't bad too bad, and reports that she is being consistent with her HEP   Currently in Pain? Yes   Pain Score 2    Pain Location Shoulder   Pain Orientation Right   Pain Descriptors / Indicators Aching   Pain Type Surgical pain   Pain Onset 1 to 4 weeks ago   Pain Frequency Intermittent   Aggravating Factors  moving arm around   Pain Relieving Factors Meds, ice            [facility] PT Assessment - 06/28/15 0001    Precautions   Precaution Comments MRSA   PROM   Right Shoulder Flexion 130 Degrees  PROM using pulleys  Right Shoulder ABduction 90 Degrees  with pulleys per protocol                     [facility] Adult PT Treatment/Exercise - 06/28/15 0001    Shoulder Exercises: Supine   External Rotation PROM;Right;10 reps   Other Supine Exercises scapular retraction 5 sec hold x 10   Shoulder Exercises: Seated   Retraction Strengthening;Both;10 reps   Shoulder Exercises: Pulleys   Flexion 2 minutes  holding at end range of motion for 10 sec   ABduction 2 minutes   Modalities   Modalities Electrical Stimulation   Cryotherapy   Number Minutes Cryotherapy 15 Minutes   Cryotherapy Location Shoulder   Type of Cryotherapy Ice pack  in supine    Electrical Stimulation   Electrical Stimulation Location R shoulder   Electrical Stimulation Action IFC   Electrical Stimulation Parameters 15 min, 100% scan, Carrier freq 5000, L 15   Electrical Stimulation Goals Pain   Manual Therapy   Manual Therapy Myofascial release   Manual therapy comments PROM within  protocol restrictions   Myofascial Release DTM over teres minor/major and STM over posterior/middle deltoid                PT Education - 06/28/15 0848    Education provided Yes   Education Details E-Stim  and benefits   Person(s) Educated Patient   Methods Explanation   Comprehension Verbalized understanding          PT Short Term Goals - 06/23/15 1321    PT SHORT TERM GOAL #1   Title pt will be I with initial HEP (07/17/2015)   Baseline doing some of them,   Time 4   Period Weeks   Status On-going   PT SHORT TERM GOAL #2   Title pt will be able to > 90 degrees flexion/abduction AROM with < 4/10 pain to assist with functional progression  (07/17/2015)   Time 4   Period Weeks   Status On-going   PT SHORT TERM GOAL #3   Title pt will be able to demonstrate R shoulder strength >3/5 with < 4/10 pain to help with ADLS (07/17/2015)   Status Unable to assess   PT SHORT TERM GOAL #4   Title pt will increase FOTO score by > 10 points to demonstrate increased function (07/17/2015)   Time 4   Period Weeks   Status Unable to assess   PT SHORT TERM GOAL #5   Title pt will be able to verbalize and demonstrate techniques to reduce R shoulder pain/ reinjury via postural awareness, lifting and carrying mechanics, RICE, and HEP (07/17/2015)   Time 4   Period Weeks   Status On-going           PT Long Term Goals - 06/28/15 1610    PT LONG TERM GOAL #1   Title At discharge she will be I with all HEP given throughout therapy (08/12/2015)   Time 8   Status On-going   PT LONG TERM GOAL #2   Title She will increase R shoulder strenght to > 4-/5 to assist with job related  acitvities (08/12/2015)   Time 8   Period Weeks   Status On-going   PT LONG TERM GOAL #3   Title pt will dmeonstrate R shoulder AROM to [facility] with <2/10 pain to assist with ADLs and personal goal of going fishing (08/12/2015)   Time 8   Period Weeks   Status On-going  PT LONG TERM GOAL #4   Title pt will be able to lift / carry > 5# overhead with < 2/10 pain to help with job related lifting and carrying activities (08/12/2015)   Time 8   Period Weeks   Status On-going   PT LONG TERM GOAL #5   Title She will increase her FOTO scorey to > 65 to demonstrate improved function at discharge   Time 8   Period Weeks   Status On-going               Plan - 06/28/15 1610    Clinical Impression Statement Mrs. Damon reports that she is conitnuing to be consistent with her HEP with intermittent pain that fluctuates. She was able to complete all exercise with mild complaint of pain with PROM at end range. During Pulley PROM she was able to get 130 degrees of flexoin and 90 of abduction per protocol. Educated about E-stim and pt reported wanting to try it. Following treatment she reported no pain and that she may not need to take her pain medication after therapy today.    PT Next Visit Plan assess response to HEP, PROM of shoulder, modalities, begin wand PROM exercises,    PT Home Exercise Plan no new HEP added   Consulted and Agree with Plan of Care Patient        Problem List There are no active problems to display for this patient.  Lulu Riding PT, DPT, LAT, ATC  06/28/2015  9:03 AM      [facility] [address] [place], [place], [address] Phone: [phone]   Fax:  [phone]

## 2015-06-30 ENCOUNTER — Ambulatory Visit: Payer: 59 | Admitting: Physical Therapy

## 2015-06-30 DIAGNOSIS — R29898 Other symptoms and signs involving the musculoskeletal system: Secondary | ICD-10-CM

## 2015-06-30 DIAGNOSIS — M25611 Stiffness of right shoulder, not elsewhere classified: Secondary | ICD-10-CM

## 2015-06-30 DIAGNOSIS — R293 Abnormal posture: Secondary | ICD-10-CM

## 2015-06-30 DIAGNOSIS — M25511 Pain in right shoulder: Secondary | ICD-10-CM

## 2015-06-30 NOTE — Patient Instructions (Signed)
   Kristoffer Leamon PT, DPT, LAT, ATC  [facility] Phone: [phone]     

## 2015-06-30 NOTE — Therapy (Signed)
[facility] [address] [place], [place], [address] Phone: [phone]   Fax:  [phone]  Physical Therapy Treatment  Patient Details  Name: Brooke French MRN: [id number] Date of Birth: Mar 12, 1961 Referring Provider:  Geoffry Paradise, MD  Encounter Date: 06/30/2015      PT End of Session - 06/30/15 0840    Visit Number 5   Number of Visits 16   Date for PT Re-Evaluation 08/12/15   PT Start Time 0800   Activity Tolerance Patient tolerated treatment well   Behavior During Therapy [facility] for tasks assessed/performed      Past Medical History  Diagnosis Date  . Hypothyroidism   . Depression   . Anxiety   . Arthritis     ankle, back, knee, shoulder  . 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 Osteoarthritis of right shoulder region 05/2015  . Bursitis of right shoulder 05/2015  . Rotator cuff tear 05/2015    right shoulder  . Dental crowns present     x 2  . Abrasions of multiple sites 05/28/2015    right forearm, right lower leg    Past Surgical History  Procedure Laterality Date  . 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 Endoscopic plantar fasciotomy Right 06/29/2000  . Pubovaginal sling  09/08/2002  . Vaginal hysterectomy  09/09/2002  . Cesarean section  1996  . Breast reduction surgery  1994  . Knee arthroscopy w/ acl reconstruction Left 11/16/2011  . Carpal tunnel release Right 01/02/2014    Procedure: RIGHT CARPAL TUNNEL RELEASE;  Surgeon: Nicki Reaper, MD;  Location: [facility];  Service: Orthopedics;  Laterality: Right;  . Steriod injection Left 01/02/2014    Procedure: STEROID INJECTION LEFT WRIST  (CARPAL TUNNEL);  Surgeon: Nicki Reaper, MD;  Location: [facility];  Service: Orthopedics;  Laterality: Left;  . Upper gi endoscopy  10/18/2004  . Plantar fascia release Left 01/22/2003  . Shoulder arthroscopy with rotator cuff repair and subacromial decompression  Right 06/03/2015    Procedure: SHOULDER ARTHROSCOPY WITH ROTATOR CUFF REPAIR AND SUBACROMIAL DECOMPRESSION;  Surgeon: Loreta Ave, MD;  Location: [facility];  Service: Orthopedics;  Laterality: Right;  . Resection distal clavical Right 06/03/2015    Procedure: RESECTION DISTAL CLAVICAL;  Surgeon: Loreta Ave, MD;  Location: [facility];  Service: Orthopedics;  Laterality: Right;    There were no vitals filed for this visit.  Visit Diagnosis:  Decreased right shoulder range of motion  Right shoulder pain  Abnormal posture  Weakness of right arm      Subjective Assessment - 06/30/15 0805    Subjective "after last visit i didn't have to take any pain medication, but yesterday I had some pain in the shoulder and had to take my pain medication"   Currently in Pain? Yes   Pain Score 2    Pain Location Shoulder   Pain Orientation Right   Pain Descriptors / Indicators Aching   Pain Type Surgical pain   Pain Onset 1 to 4 weeks ago   Pain Frequency Intermittent   Aggravating Factors  moving the arm around   Pain Relieving Factors Meds, Ice, e-stim                         [facility] Adult PT Treatment/Exercise - 06/30/15 0001    Shoulder Exercises: Supine   External Rotation PROM;Right;10 reps  External Rotation Limitations cane 10 X   Flexion AAROM;Both;15 reps   Other Supine Exercises chest press 10 X emphasize PROM x 30   Other Supine Exercises --   Shoulder Exercises: Seated   Retraction Strengthening;Both;15 reps  hold for 2 sec, with no resistance   Other Seated Exercises towel slides on table (serratus) 3 x 15   Other Seated Exercises bicep curls 2 x 10 with 1#, wrist flexion/ extension 3# x 15 ea. with arm on table  1#   Shoulder Exercises: Standing   Internal Rotation AROM;Right;15 reps  isometric against wall   Extension AROM;Strengthening;Right;15 reps  isometric against wall   Shoulder Exercises: Pulleys   Flexion 2  minutes  hold at endrange for 5 sec   ABduction 2 minutes  holding at 90 degrees per protocol   Cryotherapy   Number Minutes Cryotherapy 15 Minutes   Cryotherapy Location Shoulder   Type of Cryotherapy Ice pack   Electrical Stimulation   Electrical Stimulation Location R shoulder   Electrical Stimulation Action IFC   Electrical Stimulation Parameters 15 min, 100% scan   Electrical Stimulation Goals Pain   Manual Therapy   Manual Therapy Myofascial release;Joint mobilization   Manual therapy comments PROM within  protocol restrictions   Joint Mobilization humeral grade 2 oscillation mobs inferior and superior, Scapular mobilization grade 3 oscillations in all directions    Myofascial Release --                  PT Short Term Goals - 06/23/15 1321    PT SHORT TERM GOAL #1   Title pt will be I with initial HEP (07/17/2015)   Baseline doing some of them,   Time 4   Period Weeks   Status On-going   PT SHORT TERM GOAL #2   Title pt will be able to > 90 degrees flexion/abduction AROM with < 4/10 pain to assist with functional progression  (07/17/2015)   Time 4   Period Weeks   Status On-going   PT SHORT TERM GOAL #3   Title pt will be able to demonstrate R shoulder strength >3/5 with < 4/10 pain to help with ADLS (07/17/2015)   Status Unable to assess   PT SHORT TERM GOAL #4   Title pt will increase FOTO score by > 10 points to demonstrate increased function (07/17/2015)   Time 4   Period Weeks   Status Unable to assess   PT SHORT TERM GOAL #5   Title pt will be able to verbalize and demonstrate techniques to reduce R shoulder pain/ reinjury via postural awareness, lifting and carrying mechanics, RICE, and HEP (07/17/2015)   Time 4   Period Weeks   Status On-going           PT Long Term Goals - 06/28/15 1610    PT LONG TERM GOAL #1   Title At discharge she will be I with all HEP given throughout therapy (08/12/2015)   Time 8   Status On-going   PT LONG TERM GOAL  #2   Title She will increase R shoulder strenght to > 4-/5 to assist with job related acitvities (08/12/2015)   Time 8   Period Weeks   Status On-going   PT LONG TERM GOAL #3   Title pt will dmeonstrate R shoulder AROM to [facility] with <2/10 pain to assist with ADLs and personal goal of going fishing (08/12/2015)   Time 8   Period Weeks   Status On-going  PT LONG TERM GOAL #4   Title pt will be able to lift / carry > 5# overhead with < 2/10 pain to help with job related lifting and carrying activities (08/12/2015)   Time 8   Period Weeks   Status On-going   PT LONG TERM GOAL #5   Title She will increase her FOTO scorey to > 65 to demonstrate improved function at discharge   Time 8   Period Weeks   Status On-going               Plan - 06/30/15 0841    Clinical Impression Statement Mrs. Cardarelli continues to make progress with decreased pain during exercises without having to take medicaiton to control pain. She was able to complete all exercises without complaint of increased pain or discomfort and reported she didn't need any ice or e-stim following todays session.    PT Next Visit Plan PROM of shoulder, Modalities, shoulder isometrics, wand exercises   PT Home Exercise Plan isometric internal rotation/ extension        Problem List There are no active problems to display for this patient.  Lulu Riding PT, DPT, LAT, ATC  06/30/2015  8:56 AM      [facility] [address] [place], [place], [address] Phone: [phone]   Fax:  [phone]

## 2015-07-05 ENCOUNTER — Ambulatory Visit: Payer: 59 | Admitting: Physical Therapy

## 2015-07-05 DIAGNOSIS — M25511 Pain in right shoulder: Secondary | ICD-10-CM

## 2015-07-05 DIAGNOSIS — R293 Abnormal posture: Secondary | ICD-10-CM

## 2015-07-05 DIAGNOSIS — M25611 Stiffness of right shoulder, not elsewhere classified: Secondary | ICD-10-CM

## 2015-07-05 DIAGNOSIS — R29898 Other symptoms and signs involving the musculoskeletal system: Secondary | ICD-10-CM

## 2015-07-05 NOTE — Patient Instructions (Signed)
   Kristoffer Leamon PT, DPT, LAT, ATC  [facility] Phone: [phone]     

## 2015-07-05 NOTE — Therapy (Signed)
[place], [place], [address] Phone: [phone]   Fax:  [phone]  Physical Therapy Treatment  Patient Details  Name: Brooke French MRN: [id number] Date of Birth: Mar 29, 1961 Referring Provider:  Burnard Bunting, MD  Encounter Date: 07/05/2015      PT End of Session - 07/05/15 0844    Visit Number 6   Number of Visits 16   Date for PT Re-Evaluation 08/12/15   PT Start Time 0800   PT Stop Time 0900   PT Time Calculation (min) 60 min   Activity Tolerance Patient tolerated treatment well   Behavior During Therapy Brooke French for tasks assessed/performed      Past Medical History  Diagnosis Date  . Hypothyroidism   . Depression   . Anxiety   . Arthritis     ankle, back, knee, shoulder  . 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 Osteoarthritis of right shoulder region 05/2015  . Bursitis of right shoulder 05/2015  . Rotator cuff tear 05/2015    right shoulder  . Dental crowns present     x 2  . Abrasions of multiple sites 05/28/2015    right forearm, right lower leg    Past Surgical History  Procedure Laterality Date  . 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 Endoscopic plantar fasciotomy Right 06/29/2000  . Pubovaginal sling  09/08/2002  . Vaginal hysterectomy  09/09/2002  . Cesarean section  1996  . Breast reduction surgery  1994  . Knee arthroscopy w/ acl reconstruction Left 11/16/2011  . Carpal tunnel release Right 01/02/2014    Procedure: RIGHT CARPAL TUNNEL RELEASE;  Surgeon: Wynonia Sours, MD;  Location: [facility];  Service: Orthopedics;  Laterality: Right;  . Steriod injection Left 01/02/2014    Procedure: STEROID INJECTION LEFT WRIST  (CARPAL TUNNEL);  Surgeon: Wynonia Sours, MD;  Location: [place];  Service: Orthopedics;  Laterality: Left;  . Upper gi endoscopy  10/18/2004  . Plantar fascia release Left 01/22/2003  . Shoulder arthroscopy  with rotator cuff repair and subacromial decompression Right 06/03/2015    Procedure: SHOULDER ARTHROSCOPY WITH ROTATOR CUFF REPAIR AND SUBACROMIAL DECOMPRESSION;  Surgeon: Ninetta Lights, MD;  Location: [place] [facility];  Service: Orthopedics;  Laterality: Right;  . Resection distal clavical Right 06/03/2015    Procedure: RESECTION DISTAL CLAVICAL;  Surgeon: Ninetta Lights, MD;  Location: [place];  Service: Orthopedics;  Laterality: Right;    There were no vitals filed for this visit.  Visit Diagnosis:  Decreased right shoulder range of motion  Right shoulder pain  Weakness of right arm  Abnormal posture      Subjective Assessment - 07/05/15 0756    Subjective "I am little more sore today, I believe I did more than i am used to this week"   Currently in Pain? Yes   Pain Score 3    Pain Location Shoulder   Pain Orientation Right   Pain Descriptors / Indicators Aching;Stabbing   Pain Type Surgical pain   Pain Onset More than a month ago   Pain Frequency Intermittent   Aggravating Factors  moving the arm around, lifting and carrying   Pain Relieving Factors Meds, [name]                         [facility] Adult PT Treatment/Exercise - 07/05/15 0805    Shoulder Exercises: Standing  External Rotation AROM;Strengthening;Right;15 reps  isometric   Internal Rotation AROM;Right;15 reps  isometric   Flexion AROM;Strengthening;Right;12 reps  isometrics   ABduction AROM;Strengthening;Right  isometrics   Extension AROM;Strengthening;Right;15 reps  isometrics   Row AROM;Strengthening;Right;Left;Theraband;10 reps  VC to squeeze shoulder blades together   Theraband Level (Shoulder Row) Level 1 (Yellow)   Other Standing Exercises bicep curls 2# x15   Other Standing Exercises tricep press with band in door x 15  yellow theraband   Shoulder Exercises: Pulleys   Flexion 2 minutes  hold at end range for 10 sec   ABduction 2 minutes   holding at end range for 10 sec   Shoulder Exercises: ROM/Strengthening   UBE (Upper Arm Bike) L1 x 4 min  alt direction every 2 min   Shoulder Exercises: Stretch   Other Shoulder Stretches upper trap 2 x 30 sec   Other Shoulder Stretches pec stretch lying on table with 1/2 bolster with arms at 45 degrees  moved to door way stretch due to pain with bolster   Cryotherapy   Number Minutes Cryotherapy 15 Minutes   Cryotherapy Location Shoulder   Type of Cryotherapy Ice pack   Electrical Stimulation   Electrical Stimulation Location R shoulder   Electrical Stimulation Action IFC   Electrical Stimulation Parameters 15 min, Level 14, 100% scan,    Electrical Stimulation Goals Pain                PT Education - 07/05/15 0844    Education provided Yes   Education Details updated HEP   Person(s) Educated Patient   Methods Explanation   Comprehension Verbalized understanding          PT Short Term Goals - 07/05/15 0849    PT SHORT TERM GOAL #1   Title pt will be I with initial HEP (07/17/2015)   Baseline doing some of them,   Time 4   Period Weeks   Status Achieved   PT SHORT TERM GOAL #2   Title pt will be able to > 90 degrees flexion/abduction AROM with < 4/10 pain to assist with functional progression  (07/17/2015)   Time 4   Period Weeks   Status On-going   PT SHORT TERM GOAL #3   Title pt will be able to demonstrate R shoulder strength >3/5 with < 4/10 pain to help with ADLS (40/10/270)   Time 4   Period Weeks   Status Unable to assess   PT SHORT TERM GOAL #4   Title pt will increase FOTO score by > 10 points to demonstrate increased function (07/17/2015)   Time 4   Period Weeks   Status Unable to assess   PT SHORT TERM GOAL #5   Title pt will be able to verbalize and demonstrate techniques to reduce R shoulder pain/ reinjury via postural awareness, lifting and carrying mechanics, RICE, and HEP (07/17/2015)   Time 4   Period Weeks   Status On-going            PT Long Term Goals - 07/05/15 0850    PT LONG TERM GOAL #1   Title At discharge she will be I with all HEP given throughout therapy (08/12/2015)   Time 8   Period Weeks   Status On-going   PT LONG TERM GOAL #2   Title She will increase R shoulder strenght to > 4-/5 to assist with job related acitvities (08/12/2015)   Time 8   Period Weeks   Status On-going  PT LONG TERM GOAL #3   Title pt will dmeonstrate R shoulder AROM to [facility] with <2/10 pain to assist with ADLs and personal goal of going fishing (08/12/2015)   Time 8   Period Weeks   Status On-going   PT LONG TERM GOAL #4   Title pt will be able to lift / carry > 5# overhead with < 2/10 pain to help with job related lifting and carrying activities (08/12/2015)   Time 8   Period Weeks   Status On-going   PT LONG TERM GOAL #5   Title She will increase her FOTO scorey to > 65 to demonstrate improved function at discharge   Time 8   Period Weeks   Status On-going               Plan - 07/05/15 0844    Clinical Impression Statement Mrs. Alfredo reported some soreness prior to todays session and was able to finish all exercises with mild report of soreness during isometric abduction/ER but was fleeting when exercise was finished. pt met STG #1 today. Updated HEP to include all isometric exercises. Plan to progress per protocol.    PT Next Visit Plan PROM of shoulder, Modalities, shoulder isometrics, AAROM exercises, being light resisted IR/ER   PT Home Exercise Plan isometric flexion, abduction and external rotation        Problem List There are no active problems to display for this patient.  Starr Lake PT, DPT, LAT, ATC  [facility] Phone: [phone]     [facility] [address] [place], [place], [address] Phone: [phone]   Fax:  [phone]

## 2015-07-07 ENCOUNTER — Ambulatory Visit: Payer: 59 | Admitting: Physical Therapy

## 2015-07-07 DIAGNOSIS — M25611 Stiffness of right shoulder, not elsewhere classified: Secondary | ICD-10-CM

## 2015-07-07 DIAGNOSIS — R29898 Other symptoms and signs involving the musculoskeletal system: Secondary | ICD-10-CM

## 2015-07-07 DIAGNOSIS — R293 Abnormal posture: Secondary | ICD-10-CM

## 2015-07-07 DIAGNOSIS — M25511 Pain in right shoulder: Secondary | ICD-10-CM

## 2015-07-07 NOTE — Therapy (Signed)
[facility] Outpatient Rehabilitation [facility] [address] [place], [place], [address] Phone: [phone]   Fax:  [phone]  Physical Therapy Treatment  Patient Details  Name: Brooke French MRN: [id number] Date of Birth: 1961-07-02 Referring Provider:  Geoffry Paradise, MD  Encounter Date: 07/07/2015      PT End of Session - 07/07/15 0858    Visit Number 7   Number of Visits 16   Date for PT Re-Evaluation 08/12/15   PT Start Time 0755   PT Stop Time 0856   PT Time Calculation (min) 61 min   Activity Tolerance Patient tolerated treatment well   Behavior During Therapy [facility] for tasks assessed/performed      Past Medical History  Diagnosis Date  . Hypothyroidism   . Depression   . Anxiety   . Arthritis     ankle, back, knee, shoulder  . 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 Osteoarthritis of right shoulder region 05/2015  . Bursitis of right shoulder 05/2015  . Rotator cuff tear 05/2015    right shoulder  . Dental crowns present     x 2  . Abrasions of multiple sites 05/28/2015    right forearm, right lower leg    Past Surgical History  Procedure Laterality Date  . 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 Endoscopic plantar fasciotomy Right 06/29/2000  . Pubovaginal sling  09/08/2002  . Vaginal hysterectomy  09/09/2002  . Cesarean section  1996  . Breast reduction surgery  1994  . Knee arthroscopy w/ acl reconstruction Left 11/16/2011  . Carpal tunnel release Right 01/02/2014    Procedure: RIGHT CARPAL TUNNEL RELEASE;  Surgeon: Nicki Reaper, MD;  Location: [facility];  Service: Orthopedics;  Laterality: Right;  . Steriod injection Left 01/02/2014    Procedure: STEROID INJECTION LEFT WRIST  (CARPAL TUNNEL);  Surgeon: Nicki Reaper, MD;  Location: [facility];  Service: Orthopedics;  Laterality: Left;  . Upper gi endoscopy  10/18/2004  . Plantar fascia release Left 01/22/2003  . Shoulder arthroscopy  with rotator cuff repair and subacromial decompression Right 06/03/2015    Procedure: SHOULDER ARTHROSCOPY WITH ROTATOR CUFF REPAIR AND SUBACROMIAL DECOMPRESSION;  Surgeon: Loreta Ave, MD;  Location: [facility];  Service: Orthopedics;  Laterality: Right;  . Resection distal clavical Right 06/03/2015    Procedure: RESECTION DISTAL CLAVICAL;  Surgeon: Loreta Ave, MD;  Location: [facility];  Service: Orthopedics;  Laterality: Right;    There were no vitals filed for this visit.  Visit Diagnosis:  Decreased right shoulder range of motion - Plan: PT plan of care cert/re-cert  Right shoulder pain - Plan: PT plan of care cert/re-cert  Weakness of right arm - Plan: PT plan of care cert/re-cert  Abnormal posture - Plan: PT plan of care cert/re-cert      Subjective Assessment - 07/07/15 0800    Subjective OK today had a rough day yesterday.  I needed a pain pill.     Currently in Pain? No/denies       Pain Orientation Right   Pain Descriptors / Indicators Stabbing  yuesterday   Pain Frequency Intermittent   Aggravating Factors  doing too much   Pain Relieving Factors pain meds, ice   Multiple Pain Sites No                         [facility] Adult PT Treatment/Exercise - 07/07/15  [id number]    Shoulder Exercises: Seated   Other Seated Exercises isometrics ER, extension IR/ER5 second holds 10 X sub max pressure.     Shoulder Exercises: Prone   Extension 10 reps  leaning on counter , lifts to neutral, cues   Shoulder Exercises: Standing   Retraction --  10 reps 10 seconds   Shoulder Exercises: Pulleys   Flexion 2 minutes  flexion, abduction with stretch holds   ABduction 2 minutes   Shoulder Exercises: ROM/Strengthening   UBE (Upper Arm Bike) l1 2 minutex X 2 each way.   Shoulder Exercises: Stretch   Other Shoulder Stretches UE ranger for flexion, scaption on wall and on floor for exernsion.     Vasopneumatic   Number Minutes  Vasopneumatic  15 minutes   Vasopnuematic Location  Shoulder   Vasopneumatic Pressure Medium   Vasopneumatic Temperature  32   Manual Therapy   Manual therapy comments retrograde soft tissum, lymph node,for edema, scar tissue work anterior and PROM RT                  PT Short Term Goals - 07/05/15 0849    PT SHORT TERM GOAL #1   Title pt will be I with initial HEP (07/17/2015)   Baseline doing some of them,   Time 4   Period Weeks   Status Achieved   PT SHORT TERM GOAL #2   Title pt will be able to > 90 degrees flexion/abduction AROM with < 4/10 pain to assist with functional progression  (07/17/2015)   Time 4   Period Weeks   Status On-going   PT SHORT TERM GOAL #3   Title pt will be able to demonstrate R shoulder strength >3/5 with < 4/10 pain to help with ADLS (07/17/2015)   Time 4   Period Weeks   Status Unable to assess   PT SHORT TERM GOAL #4   Title pt will increase FOTO score by > 10 points to demonstrate increased function (07/17/2015)   Time 4   Period Weeks   Status Unable to assess   PT SHORT TERM GOAL #5   Title pt will be able to verbalize and demonstrate techniques to reduce R shoulder pain/ reinjury via postural awareness, lifting and carrying mechanics, RICE, and HEP (07/17/2015)   Time 4   Period Weeks   Status On-going           PT Long Term Goals - 07/05/15 0850    PT LONG TERM GOAL #1   Title At discharge she will be I with all HEP given throughout therapy (08/12/2015)   Time 8   Period Weeks   Status On-going   PT LONG TERM GOAL #2   Title She will increase R shoulder strenght to > 4-/5 to assist with job related acitvities (08/12/2015)   Time 8   Period Weeks   Status On-going   PT LONG TERM GOAL #3   Title pt will dmeonstrate R shoulder AROM to [facility] with <2/10 pain to assist with ADLs and personal goal of going fishing (08/12/2015)   Time 8   Period Weeks   Status On-going   PT LONG TERM GOAL #4   Title pt will be able to lift /  carry > 5# overhead with < 2/10 pain to help with job related lifting and carrying activities (08/12/2015)   Time 8   Period Weeks   Status On-going   PT LONG TERM GOAL #5   Title She  will increase her FOTO scorey to > 65 to demonstrate improved function at discharge   Time 8   Period Weeks   Status On-going               Plan - 07/07/15 0859    Clinical Impression Statement 2/10 pain post exercise RT shoulder.  Improved with game ready.  Patient stiff initially then shoulder gains ROM.  Almost 5 weeks post OP.   PT Next Visit Plan PROM of shoulder, Modalities, shoulder isometrics, AAROM exercises, being light resisted IR/ER   PT Home Exercise Plan isometric flexion, abduction and external rotation   Consulted and Agree with Plan of Care Patient        Problem List There are no active problems to display for this patient.   [facility] 07/07/2015, 9:02 AM  [facility] [address] [place], [place], [address] Phone: [phone]   Fax:  [phone]     Liz Beach, PTA 07/07/2015 9:02 AM Phone: [phone] Fax: [phone]

## 2015-07-12 ENCOUNTER — Ambulatory Visit: Payer: 59 | Admitting: Physical Therapy

## 2015-07-12 DIAGNOSIS — R293 Abnormal posture: Secondary | ICD-10-CM

## 2015-07-12 DIAGNOSIS — M25511 Pain in right shoulder: Secondary | ICD-10-CM | POA: Diagnosis not present

## 2015-07-12 DIAGNOSIS — R29898 Other symptoms and signs involving the musculoskeletal system: Secondary | ICD-10-CM

## 2015-07-12 DIAGNOSIS — M25611 Stiffness of right shoulder, not elsewhere classified: Secondary | ICD-10-CM

## 2015-07-13 NOTE — Therapy (Signed)
[facility] Outpatient Rehabilitation [facility] [address] [place], [place], [address] Phone: [phone]   Fax:  [phone]  Physical Therapy Treatment  Patient Details  Name: Brooke French MRN: [id number] Date of Birth: 1961/01/18 Referring Provider:  Sheral Apley, MD  Encounter Date: 07/12/2015      PT End of Session - 07/12/15 1700    Visit Number 8   Number of Visits 16   Date for PT Re-Evaluation 08/12/15   PT Start Time 0800   PT Stop Time 0900   PT Time Calculation (min) 60 min   Activity Tolerance Patient tolerated treatment well   Behavior During Therapy [facility] for tasks assessed/performed      Past Medical History  Diagnosis Date  . Hypothyroidism   . Depression   . Anxiety   . Arthritis     ankle, back, knee, shoulder  . 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 Osteoarthritis of right shoulder region 05/2015  . Bursitis of right shoulder 05/2015  . Rotator cuff tear 05/2015    right shoulder  . Dental crowns present     x 2  . Abrasions of multiple sites 05/28/2015    right forearm, right lower leg    Past Surgical History  Procedure Laterality Date  . 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 Endoscopic plantar fasciotomy Right 06/29/2000  . Pubovaginal sling  09/08/2002  . Vaginal hysterectomy  09/09/2002  . Cesarean section  1996  . Breast reduction surgery  1994  . Knee arthroscopy w/ acl reconstruction Left 11/16/2011  . Carpal tunnel release Right 01/02/2014    Procedure: RIGHT CARPAL TUNNEL RELEASE;  Surgeon: Nicki Reaper, MD;  Location: [facility];  Service: Orthopedics;  Laterality: Right;  . Steriod injection Left 01/02/2014    Procedure: STEROID INJECTION LEFT WRIST  (CARPAL TUNNEL);  Surgeon: Nicki Reaper, MD;  Location: [facility];  Service: Orthopedics;  Laterality: Left;  . Upper gi endoscopy  10/18/2004  . Plantar fascia release Left 01/22/2003  . Shoulder arthroscopy  with rotator cuff repair and subacromial decompression Right 06/03/2015    Procedure: SHOULDER ARTHROSCOPY WITH ROTATOR CUFF REPAIR AND SUBACROMIAL DECOMPRESSION;  Surgeon: Loreta Ave, MD;  Location: [facility];  Service: Orthopedics;  Laterality: Right;  . Resection distal clavical Right 06/03/2015    Procedure: RESECTION DISTAL CLAVICAL;  Surgeon: Loreta Ave, MD;  Location: [facility];  Service: Orthopedics;  Laterality: Right;    There were no vitals filed for this visit.  Visit Diagnosis:  Decreased right shoulder range of motion  Right shoulder pain  Weakness of right arm  Abnormal posture      Subjective Assessment - 07/12/15 0807    Subjective No pain.  No rough days since last visit.   Currently in Pain? No/denies                         [facility] Adult PT Treatment/Exercise - 07/12/15 0808    Shoulder Exercises: Seated   External Rotation Limitations 10 X yellow band.     Other Seated Exercises isometric, extension, ER/IR 5 seconds 10 times   Shoulder Exercises: Standing   Other Standing Exercises UE rander for IR   Shoulder Exercises: Pulleys   ABduction 2 minutes   Shoulder Exercises: ROM/Strengthening   UBE (Upper Arm Bike) L1.5 2 minutes each way.   Shoulder Exercises: Stretch   Other  Shoulder Stretches self mobs for IR, towel roll   Other Shoulder Stretches UE ranger for ROM   Vasopneumatic   Number Minutes Vasopneumatic  15 minutes   Vasopnuematic Location  Shoulder   Vasopneumatic Temperature  32   Manual Therapy   Manual Therapy --  PROM for shoulder ROM,  supine    Manual therapy comments retrograde and taping for edema and deltiod inhibition.  PROM RT                  PT Short Term Goals - 07/12/15 1702    PT SHORT TERM GOAL #1   Title pt will be I with initial HEP (07/17/2015)   Time 4   Period Weeks   Status Achieved   PT SHORT TERM GOAL #2   Title pt will be able to > 90 degrees  flexion/abduction AROM with < 4/10 pain to assist with functional progression  (07/17/2015)   Baseline AA   Time 4   Period Weeks   Status On-going   PT SHORT TERM GOAL #3   Title pt will be able to demonstrate R shoulder strength >3/5 with < 4/10 pain to help with ADLS (07/17/2015)   Time 4   Period Weeks   Status On-going   PT SHORT TERM GOAL #4   Title pt will increase FOTO score by > 10 points to demonstrate increased function (07/17/2015)   Time 4   Period Weeks   Status Unable to assess   PT SHORT TERM GOAL #5   Title pt will be able to verbalize and demonstrate techniques to reduce R shoulder pain/ reinjury via postural awareness, lifting and carrying mechanics, RICE, and HEP (07/17/2015)   Baseline yes   Time 4   Period Weeks   Status On-going           PT Long Term Goals - 07/05/15 0850    PT LONG TERM GOAL #1   Title At discharge she will be I with all HEP given throughout therapy (08/12/2015)   Time 8   Period Weeks   Status On-going   PT LONG TERM GOAL #2   Title She will increase R shoulder strenght to > 4-/5 to assist with job related acitvities (08/12/2015)   Time 8   Period Weeks   Status On-going   PT LONG TERM GOAL #3   Title pt will dmeonstrate R shoulder AROM to [facility] with <2/10 pain to assist with ADLs and personal goal of going fishing (08/12/2015)   Time 8   Period Weeks   Status On-going   PT LONG TERM GOAL #4   Title pt will be able to lift / carry > 5# overhead with < 2/10 pain to help with job related lifting and carrying activities (08/12/2015)   Time 8   Period Weeks   Status On-going   PT LONG TERM GOAL #5   Title She will increase her FOTO scorey to > 65 to demonstrate improved function at discharge   Time 8   Period Weeks   Status On-going               Plan - 07/13/15 1414    Clinical Impression Statement shoulder stiff today,   trial of tape, strength and ROM focus.  Following protocol   PT Next Visit Plan PROM of shoulder,  Modalities, shoulder isometrics, AAROM exercises, being light resisted IR/ER  6 weeks protocol   PT Home Exercise Plan isometric flexion, abduction and external rotation  Consulted and Agree with Plan of Care Patient        Problem List There are no active problems to display for this patient.   [facility] 07/13/2015, 2:18 PM  [facility] [address] [place], [place], [address] Phone: [phone]   Fax:  [phone]     Liz Beach, PTA 07/13/2015 2:18 PM Phone: [phone] Fax: [phone]

## 2015-07-13 NOTE — Patient Instructions (Signed)
Remove tape if irritating 

## 2015-07-14 ENCOUNTER — Ambulatory Visit: Payer: 59 | Admitting: Physical Therapy

## 2015-07-14 DIAGNOSIS — R29898 Other symptoms and signs involving the musculoskeletal system: Secondary | ICD-10-CM

## 2015-07-14 DIAGNOSIS — M25511 Pain in right shoulder: Secondary | ICD-10-CM

## 2015-07-14 DIAGNOSIS — R293 Abnormal posture: Secondary | ICD-10-CM

## 2015-07-14 DIAGNOSIS — M25611 Stiffness of right shoulder, not elsewhere classified: Secondary | ICD-10-CM

## 2015-07-14 NOTE — Patient Instructions (Signed)
   Kristoffer Leamon PT, DPT, LAT, ATC  [facility] Phone: [phone]     

## 2015-07-14 NOTE — Therapy (Signed)
[facility] Outpatient Rehabilitation [facility] [address] [place], [place], [address] Phone: [phone]   Fax:  [phone]  Physical Therapy Treatment  Patient Details  Name: Brooke French MRN: [id number] Date of Birth: Jan 10, 1961 Referring Elga Santy:  Geoffry Paradise, MD  Encounter Date: 07/14/2015      PT End of Session - 07/14/15 0847    Visit Number 9   Number of Visits 16   Date for PT Re-Evaluation 08/12/15   PT Start Time 0800   PT Stop Time 0845   PT Time Calculation (min) 45 min   Activity Tolerance Patient tolerated treatment well   Behavior During Therapy [facility] for tasks assessed/performed      Past Medical History  Diagnosis Date  . Hypothyroidism   . Depression   . Anxiety   . Arthritis     ankle, back, knee, shoulder  . 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 Osteoarthritis of right shoulder region 05/2015  . Bursitis of right shoulder 05/2015  . Rotator cuff tear 05/2015    right shoulder  . Dental crowns present     x 2  . Abrasions of multiple sites 05/28/2015    right forearm, right lower leg    Past Surgical History  Procedure Laterality Date  . 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 Endoscopic plantar fasciotomy Right 06/29/2000  . Pubovaginal sling  09/08/2002  . Vaginal hysterectomy  09/09/2002  . Cesarean section  1996  . Breast reduction surgery  1994  . Knee arthroscopy w/ acl reconstruction Left 11/16/2011  . Carpal tunnel release Right 01/02/2014    Procedure: RIGHT CARPAL TUNNEL RELEASE;  Surgeon: Nicki Reaper, MD;  Location: [facility];  Service: Orthopedics;  Laterality: Right;  . Steriod injection Left 01/02/2014    Procedure: STEROID INJECTION LEFT WRIST  (CARPAL TUNNEL);  Surgeon: Nicki Reaper, MD;  Location: [facility];  Service: Orthopedics;  Laterality: Left;  . Upper gi endoscopy  10/18/2004  . Plantar fascia release Left 01/22/2003  . Shoulder arthroscopy  with rotator cuff repair and subacromial decompression Right 06/03/2015    Procedure: SHOULDER ARTHROSCOPY WITH ROTATOR CUFF REPAIR AND SUBACROMIAL DECOMPRESSION;  Surgeon: Loreta Ave, MD;  Location: [facility];  Service: Orthopedics;  Laterality: Right;  . Resection distal clavical Right 06/03/2015    Procedure: RESECTION DISTAL CLAVICAL;  Surgeon: Loreta Ave, MD;  Location: [facility];  Service: Orthopedics;  Laterality: Right;    There were no vitals filed for this visit.  Visit Diagnosis:  Decreased right shoulder range of motion  Right shoulder pain  Weakness of right arm  Abnormal posture      Subjective Assessment - 07/14/15 0808    Subjective "I have been having some soreness in the shoulder with some pain in the crease of my elbow"    Currently in Pain? Yes   Pain Score 2    Pain Location Shoulder   Pain Orientation Right   Pain Descriptors / Indicators Aching   Pain Type Surgical pain   Pain Onset More than a month ago   Pain Frequency Intermittent   Aggravating Factors  doing too much   Pain Relieving Factors pain meds, ice            [facility] PT Assessment - 07/14/15 0843    PROM   Right Shoulder Flexion 128 Degrees   Right Shoulder ABduction 112 Degrees  [facility] Adult PT Treatment/Exercise - 07/14/15 0810    Shoulder Exercises: Supine   Horizontal ABduction AROM;Strengthening;Both;15 reps;Theraband   Theraband Level (Shoulder Horizontal ABduction) Level 2 (Red)   Other Supine Exercises PNF D2 pattern with yellow theraband x 15   Shoulder Exercises: Standing   Protraction AROM;Strengthening;Right;Theraband;10 reps   Theraband Level (Shoulder Protraction) Level 2 (Red)   External Rotation AROM;Strengthening;Right;15 reps;Theraband   Theraband Level (Shoulder External Rotation) Level 2 (Red)   Internal Rotation AROM;Right;15 reps;Theraband   Theraband Level (Shoulder Internal Rotation)  Level 2 (Red)   Flexion AROM;Strengthening;Right;15 reps;Theraband   Theraband Level (Shoulder Flexion) Level 1 (Yellow)   ABduction AROM;Strengthening;Right   Extension AROM;Strengthening;Right;15 reps;Theraband   Theraband Level (Shoulder Extension) Level 3 (Green)   Row AROM;Strengthening;Right;Left;Theraband;15 reps   Theraband Level (Shoulder Row) Level 3 (Green)   Shoulder Exercises: Pulleys   ABduction 2 minutes   Shoulder Exercises: ROM/Strengthening   UBE (Upper Arm Bike) L 1.5 x 8 min  alt direction every 2 min   Other ROM/Strengthening Exercises internal rotation behind back with UE ranger                PT Education - 07/14/15 0847    Education provided Yes   Education Details updated HEP   Person(s) Educated Patient   Methods Explanation   Comprehension Verbalized understanding          PT Short Term Goals - 07/12/15 1702    PT SHORT TERM GOAL #1   Title pt will be I with initial HEP (07/17/2015)   Time 4   Period Weeks   Status Achieved   PT SHORT TERM GOAL #2   Title pt will be able to > 90 degrees flexion/abduction AROM with < 4/10 pain to assist with functional progression  (07/17/2015)   Baseline AA   Time 4   Period Weeks   Status On-going   PT SHORT TERM GOAL #3   Title pt will be able to demonstrate R shoulder strength >3/5 with < 4/10 pain to help with ADLS (07/17/2015)   Time 4   Period Weeks   Status On-going   PT SHORT TERM GOAL #4   Title pt will increase FOTO score by > 10 points to demonstrate increased function (07/17/2015)   Time 4   Period Weeks   Status Unable to assess   PT SHORT TERM GOAL #5   Title pt will be able to verbalize and demonstrate techniques to reduce R shoulder pain/ reinjury via postural awareness, lifting and carrying mechanics, RICE, and HEP (07/17/2015)   Baseline yes   Time 4   Period Weeks   Status On-going           PT Long Term Goals - 07/05/15 0850    PT LONG TERM GOAL #1   Title At discharge  she will be I with all HEP given throughout therapy (08/12/2015)   Time 8   Period Weeks   Status On-going   PT LONG TERM GOAL #2   Title She will increase R shoulder strenght to > 4-/5 to assist with job related acitvities (08/12/2015)   Time 8   Period Weeks   Status On-going   PT LONG TERM GOAL #3   Title pt will dmeonstrate R shoulder AROM to [facility] with <2/10 pain to assist with ADLs and personal goal of going fishing (08/12/2015)   Time 8   Period Weeks   Status On-going   PT LONG TERM GOAL #4  Title pt will be able to lift / carry > 5# overhead with < 2/10 pain to help with job related lifting and carrying activities (08/12/2015)   Time 8   Period Weeks   Status On-going   PT LONG TERM GOAL #5   Title She will increase her FOTO scorey to > 65 to demonstrate improved function at discharge   Time 8   Period Weeks   Status On-going               Plan - 07/14/15 0847    Clinical Impression Statement Arraya continues to make improvements with increased shoulder AROM especially with abduction. She still demonstrates tightness with pain that stays around 2-3/10. She was able to perform and complete all exercises well today with only complaint of pain during protraction exercises which she reported feeling better after the exercise was adjusted. updated HEP to include resisted shoulder exercises with therabands.    PT Next Visit Plan  Modalities, AAROM exercises, light resisted IR/ER, manual mobs for increased shoulder flexion/abduction ROM.   PT Home Exercise Plan resisted shoulder IR/ER, protraction, PNF D2 in supine, and scaption   Consulted and Agree with Plan of Care Patient        Problem List There are no active problems to display for this patient.  Lulu Riding PT, DPT, LAT, ATC  07/14/2015  8:52 AM      [facility] [address] [place], [place], [address] Phone: [phone]   Fax:   [phone]

## 2015-07-21 ENCOUNTER — Ambulatory Visit: Payer: 59 | Attending: Orthopedic Surgery | Admitting: Physical Therapy

## 2015-07-21 DIAGNOSIS — M25611 Stiffness of right shoulder, not elsewhere classified: Secondary | ICD-10-CM

## 2015-07-21 DIAGNOSIS — R293 Abnormal posture: Secondary | ICD-10-CM | POA: Diagnosis present

## 2015-07-21 DIAGNOSIS — M25511 Pain in right shoulder: Secondary | ICD-10-CM | POA: Diagnosis present

## 2015-07-21 DIAGNOSIS — R29898 Other symptoms and signs involving the musculoskeletal system: Secondary | ICD-10-CM | POA: Diagnosis present

## 2015-07-21 NOTE — Therapy (Signed)
[place], [place], [address] Phone: [phone]   Fax:  [phone]  Physical Therapy Treatment  Patient Details  Name: Brooke French MRN: [id number] Date of Birth: 02/03/1961 Referring Provider:  Burnard Bunting, MD  Encounter Date: 07/21/2015      PT End of Session - 07/21/15 1505    PT Start Time 6294   PT Stop Time 1519   PT Time Calculation (min) 59 min   Activity Tolerance Patient tolerated treatment well;No increased pain   Behavior During Therapy [facility] for tasks assessed/performed      Past Medical History  Diagnosis Date  . Hypothyroidism   . Depression   . Anxiety   . Arthritis     ankle, back, knee, shoulder  . 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 Osteoarthritis of right shoulder region 05/2015  . Bursitis of right shoulder 05/2015  . Rotator cuff tear 05/2015    right shoulder  . Dental crowns present     x 2  . Abrasions of multiple sites 05/28/2015    right forearm, right lower leg    Past Surgical History  Procedure Laterality Date  . 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 Endoscopic plantar fasciotomy Right 06/29/2000  . Pubovaginal sling  09/08/2002  . Vaginal hysterectomy  09/09/2002  . Cesarean section  1996  . Breast reduction surgery  1994  . Knee arthroscopy w/ acl reconstruction Left 11/16/2011  . Carpal tunnel release Right 01/02/2014    Procedure: RIGHT CARPAL TUNNEL RELEASE;  Surgeon: Wynonia Sours, MD;  Location: [place];  Service: Orthopedics;  Laterality: Right;  . Steriod injection Left 01/02/2014    Procedure: STEROID INJECTION LEFT WRIST  (CARPAL TUNNEL);  Surgeon: Wynonia Sours, MD;  Location: [place];  Service: Orthopedics;  Laterality: Left;  . Upper gi endoscopy  10/18/2004  . Plantar fascia release Left 01/22/2003  . Shoulder arthroscopy with rotator cuff repair and subacromial decompression Right  06/03/2015    Procedure: SHOULDER ARTHROSCOPY WITH ROTATOR CUFF REPAIR AND SUBACROMIAL DECOMPRESSION;  Surgeon: Ninetta Lights, MD;  Location: [place];  Service: Orthopedics;  Laterality: Right;  . Resection distal clavical Right 06/03/2015    Procedure: RESECTION DISTAL CLAVICAL;  Surgeon: Ninetta Lights, MD;  Location: [place];  Service: Orthopedics;  Laterality: Right;    There were no vitals filed for this visit.  Visit Diagnosis:  Decreased right shoulder range of motion  Right shoulder pain  Weakness of right arm  Abnormal posture      Subjective Assessment - 07/21/15 1418    Subjective "I have been having some soreness in the shoulder with some pain in the crease of my elbow"   Taking less pain medication.     Currently in Pain? Yes   Pain Score 2    Pain Location Shoulder   Pain Orientation Right   Pain Descriptors / Indicators Aching   Pain Frequency Intermittent   Aggravating Factors  doing more work with arm   Pain Relieving Factors medication ,rest ice.                           [facility] Adult PT Treatment/Exercise - 07/21/15 1422    Shoulder Exercises: Supine   Horizontal ABduction --  1o reps   Theraband Level (Shoulder Horizontal ABduction) Level 2 (Red)   External Rotation 10  reps   Theraband Level (Shoulder External Rotation) Level 2 (Red)   Internal Rotation 10 reps   Theraband Level (Shoulder Internal Rotation) --  red band 10 X   Other Supine Exercises D2 for flexion and extension 10 X    Shoulder Exercises: Pulleys   Flexion 2 minutes   ABduction 2 minutes   Shoulder Exercises: ROM/Strengthening   UBE (Upper Arm Bike) L 1.5 reversing every 2 mi   Vasopneumatic   Number Minutes Vasopneumatic  15 minutes   Vasopnuematic Location  Shoulder   Vasopneumatic Pressure Medium   Vasopneumatic Temperature  32   Manual Therapy   Manual therapy comments inferior and posterior humeral glides grade 2 with RT  shoulder, abduction focus                  PT Short Term Goals - 07/21/15 1429    PT SHORT TERM GOAL #1   Title pt will be I with initial HEP (07/17/2015)   Time 4   Status Achieved   PT SHORT TERM GOAL #2   Title pt will be able to > 90 degrees flexion/abduction AROM with < 4/10 pain to assist with functional progression  (07/17/2015)   Baseline 134 flexion, Abduction 105, no pain , Heavy   Time 4   Period Weeks   Status Achieved   PT SHORT TERM GOAL #3   Title pt will be able to demonstrate R shoulder strength >3/5 with < 4/10 pain to help with ADLS (31/02/1760)   Time 4   Period Weeks   Status On-going   PT SHORT TERM GOAL #4   Title pt will increase FOTO score by > 10 points to demonstrate increased function (07/17/2015)   Baseline FOTO today 33% limitation, intake 65% limitation.   Time 4   Period Weeks   Status Achieved   PT SHORT TERM GOAL #5   Title pt will be able to verbalize and demonstrate techniques to reduce R shoulder pain/ reinjury via postural awareness, lifting and carrying mechanics, RICE, and HEP (07/17/2015)   Time 4   Period Weeks   Status Achieved           PT Long Term Goals - 07/21/15 1508    PT LONG TERM GOAL #1   Title At discharge she will be I with all HEP given throughout therapy (08/12/2015)   Time 8   Period Weeks   Status On-going   PT LONG TERM GOAL #2   Title She will increase R shoulder strenght to > 4-/5 to assist with job related acitvities (08/12/2015)   Time 8   Period Weeks   Status On-going   PT LONG TERM GOAL #3   Title pt will dmeonstrate R shoulder AROM to [facility] with <2/10 pain to assist with ADLs and personal goal of going fishing (08/12/2015)   Time 8   Period Weeks   Status On-going   PT LONG TERM GOAL #4   Title pt will be able to lift / carry > 5# overhead with < 2/10 pain to help with job related lifting and carrying activities (08/12/2015)   Time 8   Period Weeks   Status On-going   PT LONG TERM GOAL #5    Title She will increase her FOTO scorey to > 65 to demonstrate improved function at discharge   Baseline 67% Status   Time 8   Period Weeks   Status Achieved  Plan - 07/21/15 1510    Clinical Impression Statement STG#2, #3, #5 met.  No pain increased post session.   PT Next Visit Plan 1 more visit prior to 2 weeks vacation.   Consulted and Agree with Plan of Care Patient        Problem List There are no active problems to display for this patient.   [facility] 07/21/2015, 3:13 PM  [facility] [address] [place], [place], [address] Phone: [phone]   Fax:  [phone]     Melvenia Needles, PTA 07/21/2015 3:13 PM Phone: [phone] Fax: [phone]

## 2015-07-22 ENCOUNTER — Ambulatory Visit: Payer: 59 | Admitting: Physical Therapy

## 2015-07-22 DIAGNOSIS — R293 Abnormal posture: Secondary | ICD-10-CM

## 2015-07-22 DIAGNOSIS — M25611 Stiffness of right shoulder, not elsewhere classified: Secondary | ICD-10-CM

## 2015-07-22 DIAGNOSIS — M25511 Pain in right shoulder: Secondary | ICD-10-CM | POA: Diagnosis not present

## 2015-07-22 DIAGNOSIS — R29898 Other symptoms and signs involving the musculoskeletal system: Secondary | ICD-10-CM

## 2015-07-22 NOTE — Therapy (Addendum)
[place], [place], [address] Phone: [phone]   Fax:  [phone]  Physical Therapy Treatment  Patient Details  Name: GRACEMARIE SKEET MRN: [id number] Date of Birth: [birth year] Referring Provider:  Renette Butters, MD  Encounter Date: 07/22/2015      PT End of Session - 07/22/15 0813    Visit Number 11   Number of Visits 16   Date for PT Re-Evaluation 08/12/15   PT Start Time 0730   PT Stop Time 9323   PT Time Calculation (min) 47 min   Activity Tolerance Patient tolerated treatment well;No increased pain   Behavior During Therapy [facility] for tasks assessed/performed      Past Medical History  Diagnosis Date  . Hypothyroidism   . Depression   . Anxiety   . Arthritis     ankle, back, knee, shoulder  . 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 Osteoarthritis of right shoulder region 05/2015  . Bursitis of right shoulder 05/2015  . Rotator cuff tear 05/2015    right shoulder  . Dental crowns present     x 2  . Abrasions of multiple sites 05/28/2015    right forearm, right lower leg    Past Surgical History  Procedure Laterality Date  . 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 Endoscopic plantar fasciotomy Right 06/29/2000  . Pubovaginal sling  09/08/2002  . Vaginal hysterectomy  09/09/2002  . Cesarean section  1996  . Breast reduction surgery  1994  . Knee arthroscopy w/ acl reconstruction Left 11/16/2011  . Carpal tunnel release Right 01/02/2014    Procedure: RIGHT CARPAL TUNNEL RELEASE;  Surgeon: Wynonia Sours, MD;  Location: [place];  Service: Orthopedics;  Laterality: Right;  . Steriod injection Left 01/02/2014    Procedure: STEROID INJECTION LEFT WRIST  (CARPAL TUNNEL);  Surgeon: Wynonia Sours, MD;  Location: [place];  Service: Orthopedics;  Laterality: Left;  . Upper gi endoscopy  10/18/2004  . Plantar fascia release Left 01/22/2003  .  Shoulder arthroscopy with rotator cuff repair and subacromial decompression Right 06/03/2015    Procedure: SHOULDER ARTHROSCOPY WITH ROTATOR CUFF REPAIR AND SUBACROMIAL DECOMPRESSION;  Surgeon: Ninetta Lights, MD;  Location: [place];  Service: Orthopedics;  Laterality: Right;  . Resection distal clavical Right 06/03/2015    Procedure: RESECTION DISTAL CLAVICAL;  Surgeon: Ninetta Lights, MD;  Location: [place];  Service: Orthopedics;  Laterality: Right;    There were no vitals filed for this visit.  Visit Diagnosis:  Decreased right shoulder range of motion  Right shoulder pain  Weakness of right arm  Abnormal posture      Subjective Assessment - 07/22/15 0734    Subjective I am the same as yesterday   Currently in Pain? Yes   Pain Score 1    Pain Location Shoulder   Pain Orientation Right   Pain Descriptors / Indicators Aching   Pain Relieving Factors better early in the am.   Multiple Pain Sites No                         [facility] Adult PT Treatment/Exercise - 07/22/15 0736    Shoulder Exercises: Standing   Other Standing Exercises Back to wall abduction keeping arms on wall sliding  with abduction.  Unable to get quite to 90.  Flexion unable turns to scaption.  Other Standing Exercises Wall slide to flexio   Shoulder Exercises: ROM/Strengthening   UBE (Upper Arm Bike) L 1.5 total 8 minutes.   Wall Pushups 20 reps   Other ROM/Strengthening Exercises 5 LB, 2 and 1 hand lift and carry overhead in gym  mild discomfort.   Shoulder Exercises: Stretch   Corner Stretch 3 reps;30 seconds  door way   Vasopneumatic   Number Minutes Vasopneumatic  15 minutes   Vasopnuematic Location  Shoulder   Vasopneumatic Pressure Medium   Vasopneumatic Temperature  32                  PT Short Term Goals - 07/21/15 1429    PT SHORT TERM GOAL #1   Title pt will be I with initial HEP (07/17/2015)   Time 4   Status Achieved   PT  SHORT TERM GOAL #2   Title pt will be able to > 90 degrees flexion/abduction AROM with < 4/10 pain to assist with functional progression  (07/17/2015)   Baseline 134 flexion, Abduction 105, no pain , Heavy   Time 4   Period Weeks   Status Achieved   PT SHORT TERM GOAL #3   Title pt will be able to demonstrate R shoulder strength >3/5 with < 4/10 pain to help with ADLS (70/12/5007)   Time 4   Period Weeks   Status On-going   PT SHORT TERM GOAL #4   Title pt will increase FOTO score by > 10 points to demonstrate increased function (07/17/2015)   Baseline FOTO today 33% limitation, intake 65% limitation.   Time 4   Period Weeks   Status Achieved   PT SHORT TERM GOAL #5   Title pt will be able to verbalize and demonstrate techniques to reduce R shoulder pain/ reinjury via postural awareness, lifting and carrying mechanics, RICE, and HEP (07/17/2015)   Time 4   Period Weeks   Status Achieved           PT Long Term Goals - 07/21/15 1508    PT LONG TERM GOAL #1   Title At discharge she will be I with all HEP given throughout therapy (08/12/2015)   Time 8   Period Weeks   Status On-going   PT LONG TERM GOAL #2   Title She will increase R shoulder strenght to > 4-/5 to assist with job related acitvities (08/12/2015)   Time 8   Period Weeks   Status On-going   PT LONG TERM GOAL #3   Title pt will dmeonstrate R shoulder AROM to [facility] with <2/10 pain to assist with ADLs and personal goal of going fishing (08/12/2015)   Time 8   Period Weeks   Status On-going   PT LONG TERM GOAL #4   Title pt will be able to lift / carry > 5# overhead with < 2/10 pain to help with job related lifting and carrying activities (08/12/2015)   Time 8   Period Weeks   Status On-going   PT LONG TERM GOAL #5   Title She will increase her FOTO scorey to > 65 to demonstrate improved function at discharge   Baseline 67% Status   Time 8   Period Weeks   Status Achieved               Plan - 07/22/15  0813    Clinical Impression Statement No pain post exercise.  Progress toward weight overhead goal.     PT Next Visit Plan  mobilization, strengthening,   Consulted and Agree with Plan of Care Patient        Problem List There are no active problems to display for this patient.   [facility] 07/22/2015, 8:20 AM  [facility] [address] [place], [place], [address] Phone: [phone]   Fax:  [phone]     Melvenia Needles, PTA 07/22/2015 8:20 AM Phone: [phone] Fax: [phone]      PHYSICAL THERAPY DISCHARGE SUMMARY  Visits from Start of Care: 11  Current functional level related to goals / functional outcomes: FOTO 33% limited   Remaining deficits: Intermittent pain in the shoulder with weakness.    Education / Equipment: HEP, theraband for strengthening.   Plan: Patient agrees to discharge.  Patient goals were not met. Patient is being discharged due to meeting the stated rehab goals.  ?????      Kristoffer Leamon PT, DPT, LAT, ATC  08/17/2015  1:55 PM

## 2015-08-10 ENCOUNTER — Encounter: Payer: 59 | Admitting: Physical Therapy

## 2015-08-12 ENCOUNTER — Encounter: Payer: 59 | Admitting: Physical Therapy

## 2015-08-16 ENCOUNTER — Ambulatory Visit: Payer: 59 | Admitting: Physical Therapy

## 2015-11-15 MED FILL — TEMAZEPAM 30 MG CAPSULE: 30 | 30 days supply | Qty: 30 | Fill #2

## 2015-11-24 MED FILL — FLUoxetine HCL 40 MG CAPS: 40 | 90 days supply | Qty: 90 | Fill #1

## 2015-11-25 ENCOUNTER — Other Ambulatory Visit: Payer: Self-pay

## 2015-11-25 DIAGNOSIS — Z1231 Encounter for screening mammogram for malignant neoplasm of breast: Secondary | ICD-10-CM

## 2015-12-06 MED FILL — DYMISTA NASAL SPRAY: 137-50 | 30 days supply | Qty: 23 | Fill #0

## 2015-12-13 ENCOUNTER — Ambulatory Visit
Admission: RE | Admit: 2015-12-13 | Discharge: 2015-12-13 | Disposition: A | Discharge status: Home / Self Care | Payer: 59 | Source: Ambulatory Visit

## 2015-12-13 DIAGNOSIS — Z1231 Encounter for screening mammogram for malignant neoplasm of breast: Secondary | ICD-10-CM

## 2015-12-14 DIAGNOSIS — M47816 Spondylosis without myelopathy or radiculopathy, lumbar region: Secondary | ICD-10-CM | POA: Diagnosis not present

## 2015-12-27 DIAGNOSIS — Z0000 Encounter for general adult medical examination without abnormal findings: Secondary | ICD-10-CM | POA: Diagnosis not present

## 2015-12-27 DIAGNOSIS — E038 Other specified hypothyroidism: Secondary | ICD-10-CM | POA: Diagnosis not present

## 2015-12-27 DIAGNOSIS — N390 Urinary tract infection, site not specified: Secondary | ICD-10-CM | POA: Diagnosis not present

## 2015-12-27 DIAGNOSIS — R8299 Other abnormal findings in urine: Secondary | ICD-10-CM | POA: Diagnosis not present

## 2015-12-30 MED FILL — TEMAZEPAM 30 MG CAPSULE: 30 | 30 days supply | Qty: 30 | Fill #3

## 2016-01-10 DIAGNOSIS — E784 Other hyperlipidemia: Secondary | ICD-10-CM | POA: Diagnosis not present

## 2016-01-10 DIAGNOSIS — Z1389 Encounter for screening for other disorder: Secondary | ICD-10-CM | POA: Diagnosis not present

## 2016-01-10 DIAGNOSIS — E038 Other specified hypothyroidism: Secondary | ICD-10-CM | POA: Diagnosis not present

## 2016-01-10 DIAGNOSIS — Z6841 Body Mass Index (BMI) 40.0 and over, adult: Secondary | ICD-10-CM | POA: Diagnosis not present

## 2016-01-10 DIAGNOSIS — Z0000 Encounter for general adult medical examination without abnormal findings: Secondary | ICD-10-CM | POA: Diagnosis not present

## 2016-01-10 DIAGNOSIS — F329 Major depressive disorder, single episode, unspecified: Secondary | ICD-10-CM | POA: Diagnosis not present

## 2016-01-12 DIAGNOSIS — Z1212 Encounter for screening for malignant neoplasm of rectum: Secondary | ICD-10-CM | POA: Diagnosis not present

## 2016-01-13 DIAGNOSIS — H52203 Unspecified astigmatism, bilateral: Secondary | ICD-10-CM | POA: Diagnosis not present

## 2016-01-13 DIAGNOSIS — H5213 Myopia, bilateral: Secondary | ICD-10-CM | POA: Diagnosis not present

## 2016-01-13 DIAGNOSIS — H524 Presbyopia: Secondary | ICD-10-CM | POA: Diagnosis not present

## 2016-01-14 MED FILL — LEVOTHYROXINE 137 MCG TAB: 137 | 90 days supply | Qty: 90 | Fill #3

## 2016-02-14 MED FILL — TEMAZEPAM 30 MG CAPSULE: 30 | 30 days supply | Qty: 30 | Fill #0

## 2016-02-23 MED FILL — FLUoxetine HCL 40 MG CAPS: 40 | 90 days supply | Qty: 90 | Fill #0

## 2016-03-02 DIAGNOSIS — Z01419 Encounter for gynecological examination (general) (routine) without abnormal findings: Secondary | ICD-10-CM | POA: Diagnosis not present

## 2016-03-02 DIAGNOSIS — Z6841 Body Mass Index (BMI) 40.0 and over, adult: Secondary | ICD-10-CM | POA: Diagnosis not present

## 2016-03-21 MED FILL — TEMAZEPAM 30 MG CAPSULE: 30 | 90 days supply | Qty: 90 | Fill #0

## 2016-04-25 MED FILL — LEVOTHYROXINE 137 MCG TAB: 137 | 90 days supply | Qty: 90 | Fill #0

## 2016-06-07 MED FILL — FLUoxetine HCL 40 MG CAPS: 40 | 90 days supply | Qty: 90 | Fill #1

## 2016-06-20 MED FILL — TEMAZEPAM 30 MG CAPSULE: 30 | 90 days supply | Qty: 90 | Fill #1

## 2016-08-01 MED FILL — LEVOTHYROXINE 137 MCG TAB: 137 | 90 days supply | Qty: 90 | Fill #1

## 2016-08-23 DIAGNOSIS — M1812 Unilateral primary osteoarthritis of first carpometacarpal joint, left hand: Secondary | ICD-10-CM | POA: Diagnosis not present

## 2016-08-23 DIAGNOSIS — M79645 Pain in left finger(s): Secondary | ICD-10-CM | POA: Diagnosis not present

## 2016-08-23 DIAGNOSIS — R52 Pain, unspecified: Secondary | ICD-10-CM | POA: Diagnosis not present

## 2016-08-23 DIAGNOSIS — M19041 Primary osteoarthritis, right hand: Secondary | ICD-10-CM | POA: Diagnosis not present

## 2016-09-14 MED FILL — FLUoxetine HCL 40 MG CAPS: 40 | 90 days supply | Qty: 90 | Fill #2

## 2016-10-04 MED FILL — TEMAZEPAM 30 MG CAPSULE: 30 | 90 days supply | Qty: 90 | Fill #0

## 2016-11-02 MED FILL — LEVOTHYROXINE 137 MCG TAB: 137 | 90 days supply | Qty: 90 | Fill #2

## 2016-11-14 ENCOUNTER — Other Ambulatory Visit: Payer: Self-pay | Admitting: Internal Medicine

## 2016-11-14 DIAGNOSIS — Z1231 Encounter for screening mammogram for malignant neoplasm of breast: Secondary | ICD-10-CM

## 2016-12-12 MED FILL — FLUoxetine HCL 40 MG CAPS: 40 | 90 days supply | Qty: 90 | Fill #3

## 2016-12-13 ENCOUNTER — Ambulatory Visit
Admission: RE | Admit: 2016-12-13 | Discharge: 2016-12-13 | Disposition: A | Discharge status: Home / Self Care | Payer: 59 | Source: Ambulatory Visit | Attending: Internal Medicine | Admitting: Internal Medicine

## 2016-12-13 DIAGNOSIS — Z1231 Encounter for screening mammogram for malignant neoplasm of breast: Secondary | ICD-10-CM | POA: Diagnosis not present

## 2017-01-05 MED FILL — TEMAZEPAM 30 MG CAPSULE: 30 | 90 days supply | Qty: 90 | Fill #0

## 2017-01-08 DIAGNOSIS — R8290 Unspecified abnormal findings in urine: Secondary | ICD-10-CM | POA: Diagnosis not present

## 2017-01-08 DIAGNOSIS — E039 Hypothyroidism, unspecified: Secondary | ICD-10-CM | POA: Diagnosis not present

## 2017-01-08 DIAGNOSIS — E784 Other hyperlipidemia: Secondary | ICD-10-CM | POA: Diagnosis not present

## 2017-01-08 DIAGNOSIS — Z0000 Encounter for general adult medical examination without abnormal findings: Secondary | ICD-10-CM | POA: Diagnosis not present

## 2017-01-08 DIAGNOSIS — N390 Urinary tract infection, site not specified: Secondary | ICD-10-CM | POA: Diagnosis not present

## 2017-01-15 DIAGNOSIS — Z1212 Encounter for screening for malignant neoplasm of rectum: Secondary | ICD-10-CM | POA: Diagnosis not present

## 2017-01-15 DIAGNOSIS — D638 Anemia in other chronic diseases classified elsewhere: Secondary | ICD-10-CM | POA: Diagnosis not present

## 2017-01-15 DIAGNOSIS — Z1389 Encounter for screening for other disorder: Secondary | ICD-10-CM | POA: Diagnosis not present

## 2017-01-15 DIAGNOSIS — Z6841 Body Mass Index (BMI) 40.0 and over, adult: Secondary | ICD-10-CM | POA: Diagnosis not present

## 2017-01-15 DIAGNOSIS — E038 Other specified hypothyroidism: Secondary | ICD-10-CM | POA: Diagnosis not present

## 2017-01-15 DIAGNOSIS — E784 Other hyperlipidemia: Secondary | ICD-10-CM | POA: Diagnosis not present

## 2017-01-15 DIAGNOSIS — Z0000 Encounter for general adult medical examination without abnormal findings: Secondary | ICD-10-CM | POA: Diagnosis not present

## 2017-01-15 DIAGNOSIS — F329 Major depressive disorder, single episode, unspecified: Secondary | ICD-10-CM | POA: Diagnosis not present

## 2017-01-15 MED FILL — POLY-IRON 150 MG CAPSULE: 150 | 30 days supply | Qty: 30 | Fill #0

## 2017-01-15 MED FILL — LEVOTHYROXINE 137 MCG TAB: 137 | 90 days supply | Qty: 90 | Fill #3

## 2017-01-15 MED FILL — BUPROPION HCL XL 150 MG TAB: 150 | 30 days supply | Qty: 30 | Fill #0

## 2017-02-12 MED FILL — BUPROPION HCL XL 150 MG TAB: 150 | 30 days supply | Qty: 30 | Fill #1

## 2017-02-12 MED FILL — POLY-IRON 150 MG CAPSULE: 150 | 30 days supply | Qty: 30 | Fill #1

## 2017-03-07 MED FILL — FLUoxetine HCL 40 MG CAPS: 40 | 90 days supply | Qty: 90 | Fill #0

## 2017-03-13 IMAGING — MR MR SHOULDER*R* W/O CM
4 of 6 series · 19 of 40 positions shown · non-contrast
Comparison: Chest radiograph 01/09/2015.

CLINICAL DATA: RIGHT shoulder pain since 05/19/2015. Worsening
symptoms for several months.

EXAM:
MRI OF THE RIGHT SHOULDER WITHOUT CONTRAST
TECHNIQUE: Multiplanar, multisequence MR imaging of the shoulder was performed.
No intravenous contrast was administered.

[Series 6: PD · oblique · 3.0mm · 0.27mm/px · 8 of 29 slices shown]
[im 1/29]
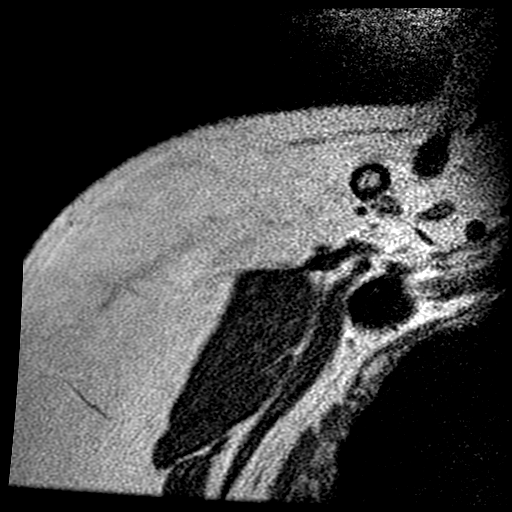
[im 5/29]
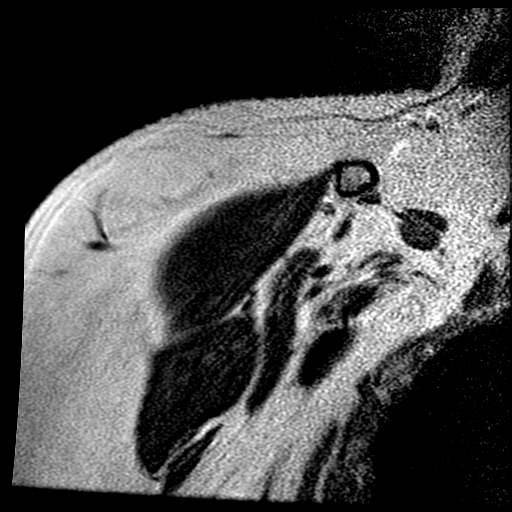
[im 9/29]
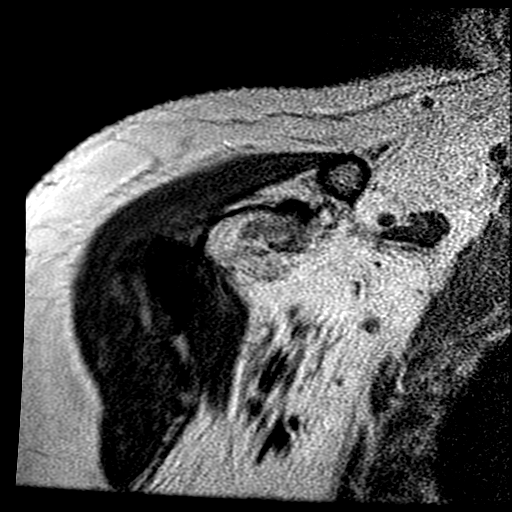
[im 13/29]
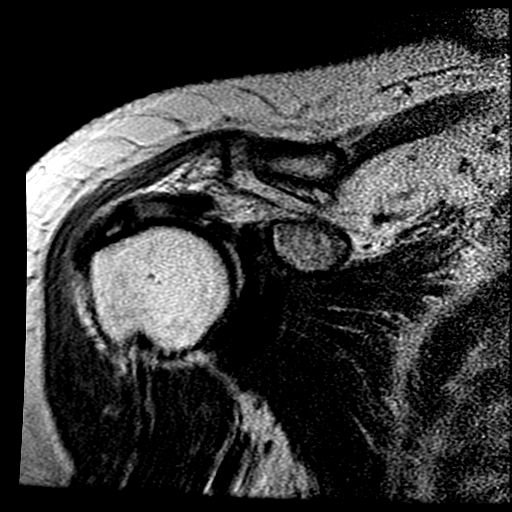
[im 17/29]
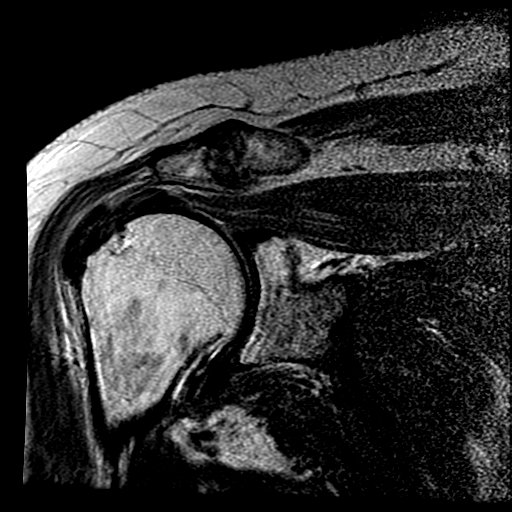
[im 21/29]
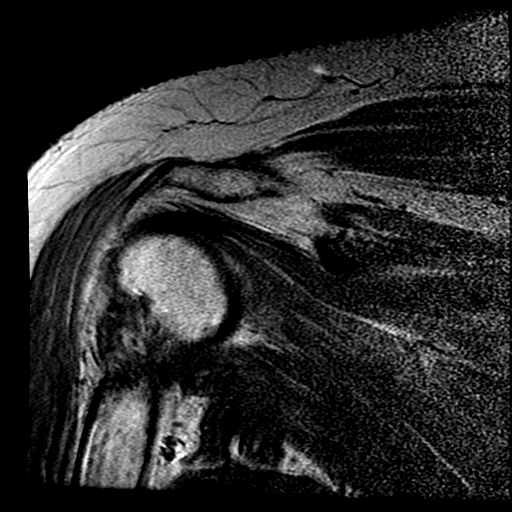
[im 25/29]
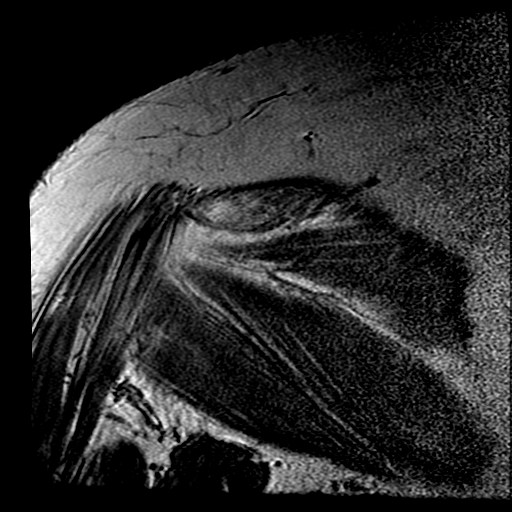
[im 29/29]
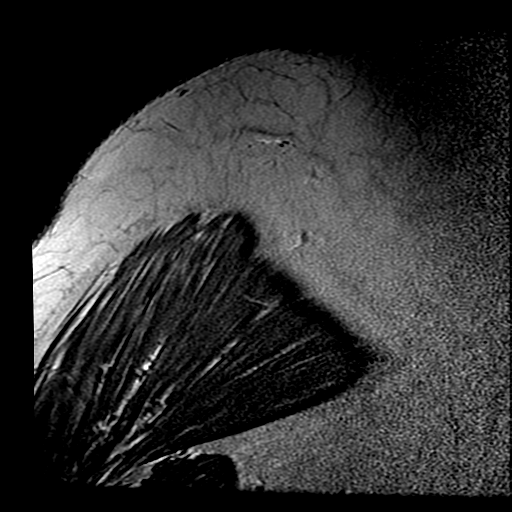

[Series 7: T2 fat-sat · oblique · 3.0mm · 0.27mm/px · 5 of 29 slices shown (1 of 3)]
[im 1/29]
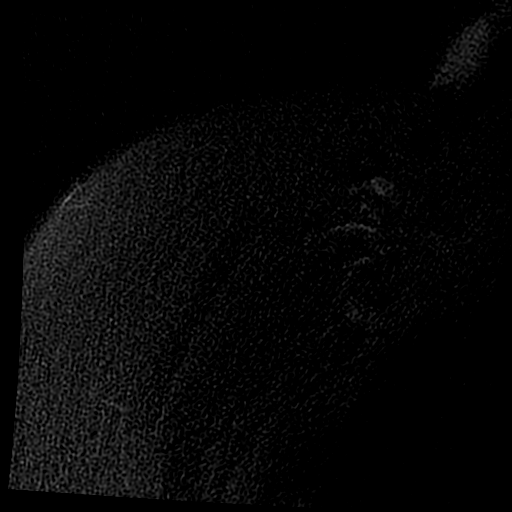
[im 5/29]
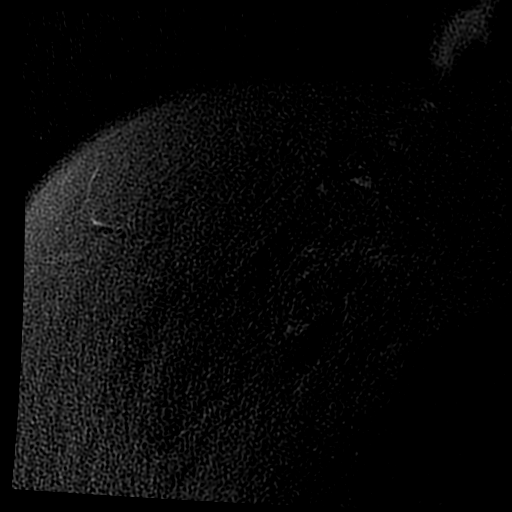
[im 10/29]
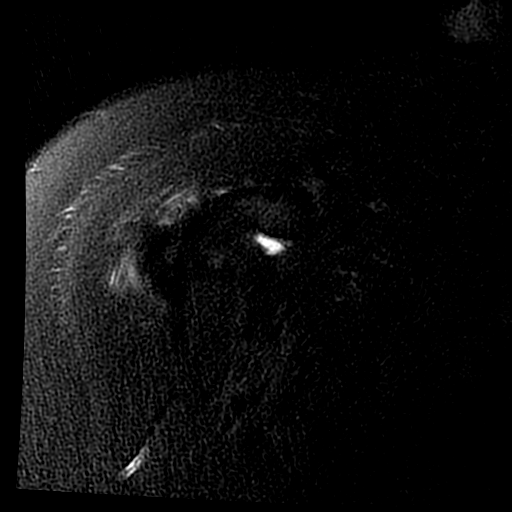
[im 15/29]
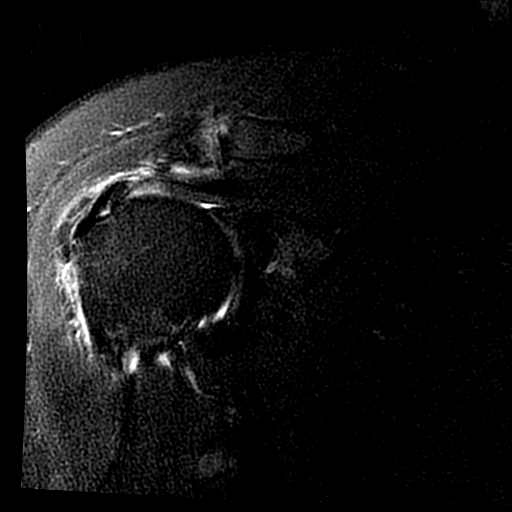
[im 24/29]
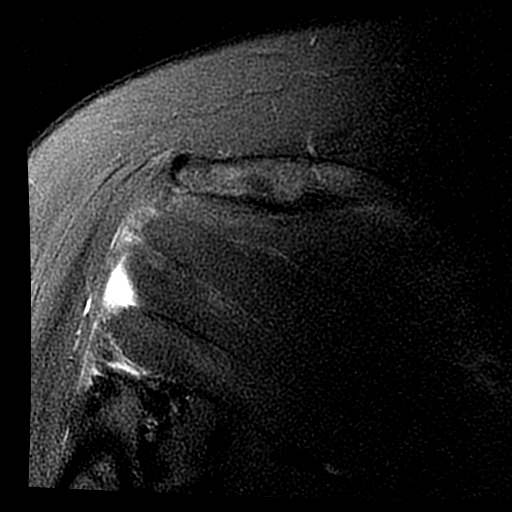

[Series 8: T2 fat-sat · oblique · 3.0mm · 0.27mm/px · 3 of 26 slices shown (2 of 3)]
[im 6/26]
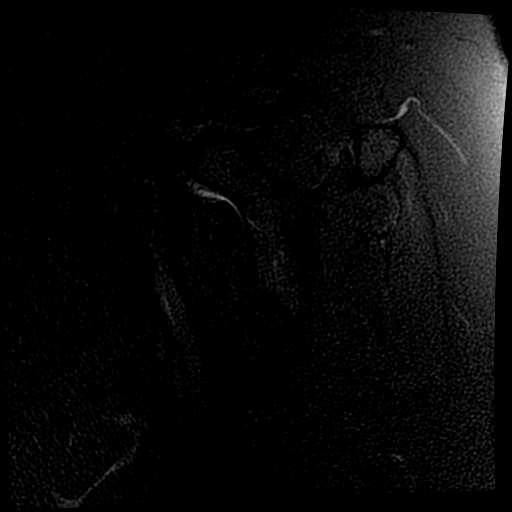
[im 16/26]
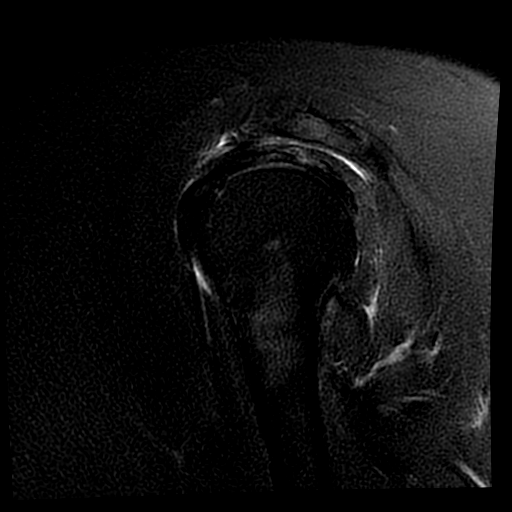
[im 26/26]
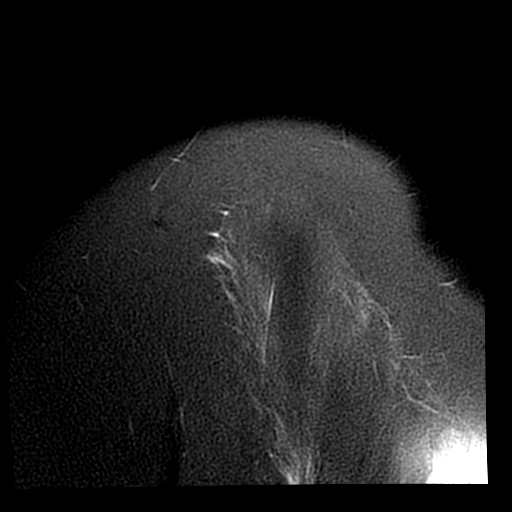

[Series 10: T2 fat-sat · axial · 3.0mm · 0.27mm/px · z∈[+28,+82]mm · 3 of 29 slices shown (3 of 3)]
[im 5/29]
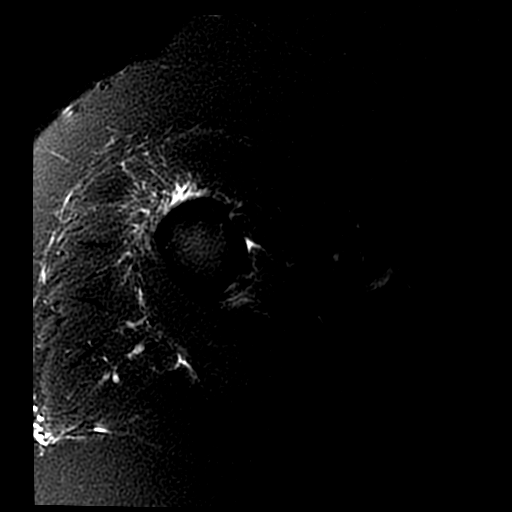
[im 15/29]
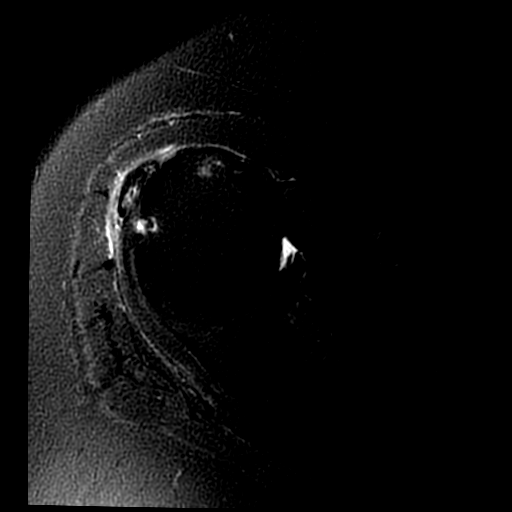
[im 24/29]
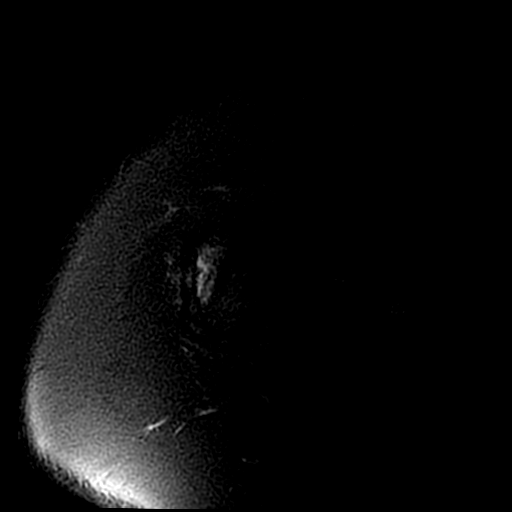

[19 of 40 positions shown; findings below may reference images not displayed]

FINDINGS: Rotator cuff: Severe SUPRASPINATUS tendinopathy is present.
Intrasubstance tearing is present at the insertion. Articular
surface fraying of the tendon. INFRASPINATUS tendinopathy is present
as well, with bursal and articular surface fraying. Teres minor
tendon is intact. SUBSCAPULARIS tendon intact.

Low signal is present superficial to the SUPRASPINATUS footprint,
representing either a superficial calcific tendinitis or more likely
calcific bursitis. Comparing to the chest radiograph, there did
appear to be calcification superior to the humeral head.

Muscles:  Negative for atrophy.

Biceps long head:  Diminutive but intact.

Acromioclavicular Joint: Moderate to severe AC joint osteoarthritis.
Subacromial bursitis. Acromial shape is type 2.

Glenohumeral Joint: Small glenohumeral effusion.

Labrum:  Grossly normal.

Bones: Bone marrow signal shows heterogenous marrow. This is a
nonspecific finding most commonly associated with obesity, anemia,
cigarette smoking or chronic disease.
IMPRESSION: 1. Severe SUPRASPINATUS tendinopathy with intrasubstance tearing at
the insertion and diffuse fraying of the tendon but no
full-thickness tear or retraction.
2. INFRASPINATUS tendinopathy and fraying without tear.
3. Subacromial bursitis and AC joint osteoarthritis.
4. Calcific bursitis of the subacromial/subdeltoid bursa.

## 2017-03-13 MED FILL — BUPROPION HCL XL 150 MG TAB: 150 | 30 days supply | Qty: 30 | Fill #2

## 2017-03-21 DIAGNOSIS — Z6841 Body Mass Index (BMI) 40.0 and over, adult: Secondary | ICD-10-CM | POA: Diagnosis not present

## 2017-03-21 DIAGNOSIS — Z01419 Encounter for gynecological examination (general) (routine) without abnormal findings: Secondary | ICD-10-CM | POA: Diagnosis not present

## 2017-03-21 DIAGNOSIS — D649 Anemia, unspecified: Secondary | ICD-10-CM | POA: Diagnosis not present

## 2017-03-22 ENCOUNTER — Encounter: Payer: Self-pay | Admitting: Hematology & Oncology

## 2017-04-02 DIAGNOSIS — H52203 Unspecified astigmatism, bilateral: Secondary | ICD-10-CM | POA: Diagnosis not present

## 2017-04-02 DIAGNOSIS — H524 Presbyopia: Secondary | ICD-10-CM | POA: Diagnosis not present

## 2017-04-02 DIAGNOSIS — H5213 Myopia, bilateral: Secondary | ICD-10-CM | POA: Diagnosis not present

## 2017-04-12 MED FILL — BUPROPION HCL XL 150 MG TAB: 150 | 30 days supply | Qty: 30 | Fill #3

## 2017-04-15 ENCOUNTER — Other Ambulatory Visit: Payer: Self-pay | Admitting: Family

## 2017-04-16 ENCOUNTER — Ambulatory Visit (HOSPITAL_BASED_OUTPATIENT_CLINIC_OR_DEPARTMENT_OTHER): Payer: 59

## 2017-04-16 ENCOUNTER — Ambulatory Visit (HOSPITAL_BASED_OUTPATIENT_CLINIC_OR_DEPARTMENT_OTHER): Payer: 59 | Admitting: Family

## 2017-04-16 ENCOUNTER — Other Ambulatory Visit: Payer: Self-pay | Admitting: Family

## 2017-04-16 ENCOUNTER — Other Ambulatory Visit (HOSPITAL_BASED_OUTPATIENT_CLINIC_OR_DEPARTMENT_OTHER): Payer: 59

## 2017-04-16 VITALS — BP 137/71 | HR 67 | Temp 97.9°F | Resp 18 | Wt 250.0 lb

## 2017-04-16 DIAGNOSIS — D510 Vitamin B12 deficiency anemia due to intrinsic factor deficiency: Secondary | ICD-10-CM

## 2017-04-16 DIAGNOSIS — D649 Anemia, unspecified: Secondary | ICD-10-CM | POA: Diagnosis not present

## 2017-04-16 DIAGNOSIS — K909 Intestinal malabsorption, unspecified: Secondary | ICD-10-CM | POA: Insufficient documentation

## 2017-04-16 DIAGNOSIS — E559 Vitamin D deficiency, unspecified: Secondary | ICD-10-CM

## 2017-04-16 DIAGNOSIS — Z9884 Bariatric surgery status: Secondary | ICD-10-CM | POA: Insufficient documentation

## 2017-04-16 DIAGNOSIS — D500 Iron deficiency anemia secondary to blood loss (chronic): Secondary | ICD-10-CM | POA: Diagnosis not present

## 2017-04-16 DIAGNOSIS — D508 Other iron deficiency anemias: Secondary | ICD-10-CM

## 2017-04-16 LAB — CBC WITH DIFFERENTIAL (CANCER CENTER ONLY)
BASO#: 0 10*3/uL (ref 0.0–0.2)
BASO%: 0.1 % (ref 0.0–2.0)
EOS%: 0.9 % (ref 0.0–7.0)
Eosinophils Absolute: 0.1 10*3/uL (ref 0.0–0.5)
HEMATOCRIT: 33.6 % — AB (ref 34.8–46.6)
HEMOGLOBIN: 10.3 g/dL — AB (ref 11.6–15.9)
LYMPH#: 2.6 10*3/uL (ref 0.9–3.3)
LYMPH%: 33.9 % (ref 14.0–48.0)
MCH: 22.9 pg — ABNORMAL LOW (ref 26.0–34.0)
MCHC: 30.7 g/dL — AB (ref 32.0–36.0)
MCV: 75 fL — ABNORMAL LOW (ref 81–101)
MONO#: 0.7 10*3/uL (ref 0.1–0.9)
MONO%: 8.6 % (ref 0.0–13.0)
NEUT%: 56.5 % (ref 39.6–80.0)
NEUTROS ABS: 4.4 10*3/uL (ref 1.5–6.5)
Platelets: 407 10*3/uL — ABNORMAL HIGH (ref 145–400)
RBC: 4.49 10*6/uL (ref 3.70–5.32)
RDW: 18.2 % — ABNORMAL HIGH (ref 11.1–15.7)
WBC: 7.8 10*3/uL (ref 3.9–10.0)

## 2017-04-16 LAB — COMPREHENSIVE METABOLIC PANEL (CC13)
ALBUMIN: 4.2 g/dL (ref 3.5–5.5)
ALK PHOS: 163 IU/L — AB (ref 39–117)
ALT: 15 IU/L (ref 0–32)
AST (SGOT): 16 IU/L (ref 0–40)
Albumin/Globulin Ratio: 1.4 (ref 1.2–2.2)
BILIRUBIN TOTAL: 0.2 mg/dL (ref 0.0–1.2)
BUN / CREAT RATIO: 18 (ref 9–23)
BUN: 13 mg/dL (ref 6–24)
CHLORIDE: 97 mmol/L (ref 96–106)
Calcium, Ser: 9.7 mg/dL (ref 8.7–10.2)
Carbon Dioxide, Total: 26 mmol/L (ref 20–29)
Creatinine, Ser: 0.71 mg/dL (ref 0.57–1.00)
GFR calc non Af Amer: 95 mL/min/{1.73_m2} (ref >59)
GFR, EST AFRICAN AMERICAN: 109 mL/min/{1.73_m2} (ref >59)
GLOBULIN, TOTAL: 3.1 g/dL (ref 1.5–4.5)
GLUCOSE: 92 mg/dL (ref 65–99)
Potassium, Ser: 3.8 mmol/L (ref 3.5–5.2)
Sodium: 135 mmol/L (ref 134–144)
Total Protein: 7.3 g/dL (ref 6.0–8.5)

## 2017-04-16 LAB — CHCC SATELLITE - SMEAR

## 2017-04-16 NOTE — Progress Notes (Unsigned)
erroneous

## 2017-04-16 NOTE — Progress Notes (Signed)
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 She has now developed iron deficiency anemia. Ferritin is 7 with an iron saturation of 5%.  She is symptomatic with fatigued, chewing ice, brain fog and occasional palpitations. She tried an oral iron supplement which caused a great deal of GI upset.  No family history of anemia that she knows of.  No personal cancer history. Family history includes: Dad - skin cancer (basal cell) and maternal uncle - bladder cancer.  She had a partial hysterectomy years ago. She has one adult child and no history of miscarriage.  No episodes of bleeding, bruising or petechiae. No lymphadenopathy found on exam. She has hypothyroidism and takes synthroid daily.  No fever, chills, n/v, cough, rash, dizziness, SOB, chest pain, abdominal pain or changes in bowel or bladder habits.  No swelling, tenderness, numbness or tingling in her extremities. No c/o pain.  She has maintained a good appetite and is staying well hydrated. Her weight is stable.  Mammogram in February was negative. Colonoscopy in January 2013 was negative and will repeat in 10 years.  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   ROS: All other 10 point review of systems is negative.   PAST MEDICAL HISTORY:   Past Medical History:  Diagnosis Date  . Abrasions of multiple sites 05/28/2015   right forearm, right lower leg  . Anxiety   . Arthritis    ankle, back, knee, shoulder  . 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 Bursitis of right shoulder 05/2015  . Dental crowns present    x 2  .  Depression   . Hypothyroidism   . Osteoarthritis of right shoulder region 05/2015  . Rotator cuff tear 05/2015   right shoulder    ALLERGIES: No Known Allergies    MEDICATIONS:  Current Outpatient Prescriptions on File Prior to Visit  Medication Sig Dispense Refill  . Biotin 5000 MCG CAPS Take by mouth daily.    Marland Kitchen FLUoxetine (PROZAC) 40 MG capsule Take 40 mg by mouth daily.    Marland Kitchen levothyroxine (SYNTHROID, LEVOTHROID) 137 MCG tablet Take 137 mcg by mouth daily.    Marland Kitchen loratadine (CLARITIN) 10 MG tablet Take 10 mg by mouth daily.    . Multiple Vitamins-Minerals (MULTIVITAMIN WITH MINERALS) tablet Take 1 tablet by mouth daily.    . Nutritional Supplements (ESTROVEN PO) Take by mouth.    . ondansetron (ZOFRAN) 4 MG tablet Take 1 tablet (4 mg total) by mouth every 8 (eight) hours as needed for nausea or vomiting. 40 tablet 0  . oxyCODONE-acetaminophen (PERCOCET) 7.5-325 MG per tablet Take 1-2 tabs po q4-6 hours prn pain 60 tablet 0  . temazepam (RESTORIL) 30 MG capsule Take 30 mg by mouth at bedtime as needed for sleep.     No current facility-administered medications on file prior to visit.      PAST SURGICAL HISTORY Past Surgical History:  Procedure Laterality Date  . BREAST REDUCTION SURGERY  1994  . CARPAL TUNNEL RELEASE Right 01/02/2014   Procedure: RIGHT CARPAL TUNNEL RELEASE;  Surgeon: Nicki Reaper,  MD;  Location: [facility];  Service: Orthopedics;  Laterality: Right;  . CESAREAN SECTION  1996  . ENDOSCOPIC PLANTAR FASCIOTOMY Right 06/29/2000  . KNEE ARTHROSCOPY W/ ACL RECONSTRUCTION Left 11/16/2011  . PLANTAR FASCIA RELEASE Left 01/22/2003  . PUBOVAGINAL SLING  09/08/2002  . REDUCTION MAMMAPLASTY Bilateral 1994  . RESECTION DISTAL CLAVICAL Right 06/03/2015   Procedure: RESECTION DISTAL CLAVICAL;  Surgeon: Loreta Aveaniel F Murphy, MD;  Location: [facility];  Service: Orthopedics;  Laterality: Right;  . ROUX-EN-Y GASTRIC BYPASS  10/18/2004  . SHOULDER ARTHROSCOPY  WITH ROTATOR CUFF REPAIR AND SUBACROMIAL DECOMPRESSION Right 06/03/2015   Procedure: SHOULDER ARTHROSCOPY WITH ROTATOR CUFF REPAIR AND SUBACROMIAL DECOMPRESSION;  Surgeon: Loreta Aveaniel F Murphy, MD;  Location: [facility];  Service: Orthopedics;  Laterality: Right;  . STERIOD INJECTION Left 01/02/2014   Procedure: STEROID INJECTION LEFT WRIST  (CARPAL TUNNEL);  Surgeon: Nicki ReaperGary R Kuzma, MD;  Location:  SURGERY CENTER;  Service: Orthopedics;  Laterality: Left;  . UPPER GI ENDOSCOPY  10/18/2004  . VAGINAL HYSTERECTOMY  09/09/2002    FAMILY HISTORY: No family history on file.  SOCIAL HISTORY:  reports that she has quit smoking. She has never used smokeless tobacco. She reports that she drinks alcohol. She reports that she does not use drugs.  PERFORMANCE STATUS: The patient's performance status is 1 - Symptomatic but completely ambulatory  PHYSICAL EXAM: Most Recent Vital Signs: Blood pressure 137/71, pulse 67, temperature 97.9 F (36.6 C), temperature source Oral, resp. rate 18, weight 250 lb (113.4 kg), SpO2 97 %. BP 137/71 (BP Location: Left Arm, Patient Position: Sitting)   Pulse 67   Temp 97.9 F (36.6 C) (Oral)   Resp 18   Wt 250 lb (113.4 kg)   SpO2 97%   BMI 42.91 kg/m   General Appearance:    Alert, cooperative, no distress, appears stated age  Head:    Normocephalic, without obvious abnormality, atraumatic  Eyes:    PERRL, conjunctiva/corneas clear, EOM's intact, fundi    benign, both eyes        Throat:   Lips, mucosa, and tongue normal; teeth and gums normal  Neck:   Supple, symmetrical, trachea midline, no adenopathy;    thyroid:  no enlargement/tenderness/nodules; no carotid   bruit or JVD  Back:     Symmetric, no curvature, ROM normal, no CVA tenderness  Lungs:     Clear to auscultation bilaterally, respirations unlabored  Chest Wall:    No tenderness or deformity   Heart:    Regular rate and rhythm, S1 and S2 normal, no murmur, rub   or gallop      Abdomen:     Soft, non-tender, bowel sounds active all four quadrants,    no masses, no organomegaly        Extremities:   Extremities normal, atraumatic, no cyanosis or edema  Pulses:   2+ and symmetric all extremities  Skin:   Skin color, texture, turgor normal, no rashes or lesions  Lymph nodes:   Cervical, supraclavicular, and axillary nodes normal  Neurologic:   CNII-XII intact, normal strength, sensation and reflexes    throughout    LABORATORY DATA:  Results for orders placed or performed in visit on 04/16/17 (from the past 48 hour(s))  CBC w/Diff     Status: Abnormal   Collection Time: 04/16/17  3:03 PM  Result Value Ref Range   WBC 7.8 3.9 - 10.0 10e3/uL   RBC 4.49 3.70 - 5.32 10e6/uL  HGB 10.3 (L) 11.6 - 15.9 g/dL   HCT 16.1 (L) 09.6 - 04.5 %   MCV 75 (L) 81 - 101 fL   MCH 22.9 (L) 26.0 - 34.0 pg   MCHC 30.7 (L) 32.0 - 36.0 g/dL   RDW 40.9 (H) 81.1 - 91.4 %   Platelets 407 (H) 145 - 400 10e3/uL   NEUT# 4.4 1.5 - 6.5 10e3/uL   LYMPH# 2.6 0.9 - 3.3 10e3/uL   MONO# 0.7 0.1 - 0.9 10e3/uL   Eosinophils Absolute 0.1 0.0 - 0.5 10e3/uL   BASO# 0.0 0.0 - 0.2 10e3/uL   NEUT% 56.5 39.6 - 80.0 %   LYMPH% 33.9 14.0 - 48.0 %   MONO% 8.6 0.0 - 13.0 %   EOS% 0.9 0.0 - 7.0 %   BASO% 0.1 0.0 - 2.0 %  Smear     Status: None   Collection Time: 04/16/17  3:03 PM  Result Value Ref Range   Smear Result Smear Available       RADIOGRAPHY: No results found.     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 Ferritin is 7 with an iron saturation of 5%. She is symptomatic with fatigue, palpitations, chewing ice and brain fog. We will plan to give her 2 doses of IV iron with the first dose later this week.  We will plan to see her back again for follow-up in 6 weeks.  All questions were answered and she is in agreement with the plan. She will contact our  office with any questions or concerns. We can certainly see her sooner if needed.   She was discussed with and also seen by Dr. Myna Hidalgo and he is in agreement with the aforementioned.   [facility] M   ADDENDUM:  I saw and examined patient with Dareion Kneece. I agree with the above assessment. She clearly has iron deficiency anemia. I will set her smear under the microscope.  We have to worry about pernicious anemia also. Since it's been 13 years since she had her gastric bypass, she definitely is at risk for malabsorption of vitamin B-12.  His very nice talking with her. We spent about 40 minutes with her. We will clearly be able to help her and make her feel better. She works in the [employer] for [employer]. I want to make sure that she is able to be more productive at work.  We will probably get her back to see [place] about 6 weeks or so.  Christin Bach, MD

## 2017-04-17 LAB — ERYTHROPOIETIN: Erythropoietin: 80.4 m[IU]/mL — ABNORMAL HIGH (ref 2.6–18.5)

## 2017-04-17 LAB — IRON AND TIBC
%SAT: 5 % — ABNORMAL LOW (ref 21–57)
Iron: 24 ug/dL — ABNORMAL LOW (ref 41–142)
TIBC: 498 ug/dL — AB (ref 236–444)
UIBC: 474 ug/dL — AB (ref 120–384)

## 2017-04-17 LAB — VITAMIN B12: VITAMIN B 12: 386 pg/mL (ref 232–1245)

## 2017-04-17 LAB — VITAMIN D 25 HYDROXY (VIT D DEFICIENCY, FRACTURES): VIT D 25 HYDROXY: 21.6 ng/mL — AB (ref 30.0–100.0)

## 2017-04-17 LAB — RETICULOCYTES: Reticulocyte Count: 1.5 % (ref 0.6–2.6)

## 2017-04-17 LAB — FERRITIN: FERRITIN: 7 ng/mL — AB (ref 9–269)

## 2017-04-17 LAB — LACTATE DEHYDROGENASE: LDH: 206 U/L (ref 125–245)

## 2017-04-20 ENCOUNTER — Ambulatory Visit (HOSPITAL_BASED_OUTPATIENT_CLINIC_OR_DEPARTMENT_OTHER): Payer: 59

## 2017-04-20 ENCOUNTER — Other Ambulatory Visit: Payer: Self-pay | Admitting: Family

## 2017-04-20 VITALS — BP 120/51 | HR 67 | Temp 98.1°F | Resp 16

## 2017-04-20 DIAGNOSIS — Z9884 Bariatric surgery status: Secondary | ICD-10-CM

## 2017-04-20 DIAGNOSIS — D508 Other iron deficiency anemias: Secondary | ICD-10-CM | POA: Diagnosis not present

## 2017-04-20 DIAGNOSIS — K909 Intestinal malabsorption, unspecified: Secondary | ICD-10-CM

## 2017-04-20 MED ORDER — SODIUM CHLORIDE 0.9 % IV SOLN
510.0000 mg | Freq: Once | INTRAVENOUS | Status: AC
  Administered 2017-04-20: 510 mg via INTRAVENOUS
  Filled 2017-04-20: qty 17

## 2017-04-20 MED ORDER — SODIUM CHLORIDE 0.9 % IV SOLN
Freq: Once | INTRAVENOUS | Status: AC
  Administered 2017-04-20: 14:00:00 via INTRAVENOUS

## 2017-04-20 NOTE — Patient Instructions (Signed)

## 2017-04-27 ENCOUNTER — Ambulatory Visit (HOSPITAL_BASED_OUTPATIENT_CLINIC_OR_DEPARTMENT_OTHER): Payer: 59

## 2017-04-27 VITALS — BP 121/48 | HR 73 | Temp 98.2°F | Resp 18

## 2017-04-27 DIAGNOSIS — D508 Other iron deficiency anemias: Secondary | ICD-10-CM | POA: Diagnosis not present

## 2017-04-27 DIAGNOSIS — K909 Intestinal malabsorption, unspecified: Secondary | ICD-10-CM

## 2017-04-27 DIAGNOSIS — Z9884 Bariatric surgery status: Secondary | ICD-10-CM

## 2017-04-27 MED ORDER — SODIUM CHLORIDE 0.9 % IV SOLN
510.0000 mg | Freq: Once | INTRAVENOUS | Status: AC
  Administered 2017-04-27: 510 mg via INTRAVENOUS
  Filled 2017-04-27: qty 17

## 2017-04-27 MED ORDER — SODIUM CHLORIDE 0.9 % IV SOLN
Freq: Once | INTRAVENOUS | Status: AC
  Administered 2017-04-27: 15:00:00 via INTRAVENOUS

## 2017-04-27 NOTE — Patient Instructions (Signed)

## 2017-04-30 MED FILL — TEMAZEPAM 30 MG CAPSULE: 30 | 90 days supply | Qty: 90 | Fill #0

## 2017-04-30 MED FILL — LEVOTHYROXINE 137 MCG TAB: 137 | 90 days supply | Qty: 90 | Fill #0

## 2017-05-14 MED FILL — BUPROPION HCL XL 150 MG TAB: 150 | 90 days supply | Qty: 90 | Fill #0

## 2017-05-25 DIAGNOSIS — M47816 Spondylosis without myelopathy or radiculopathy, lumbar region: Secondary | ICD-10-CM | POA: Diagnosis not present

## 2017-06-01 ENCOUNTER — Other Ambulatory Visit (HOSPITAL_BASED_OUTPATIENT_CLINIC_OR_DEPARTMENT_OTHER): Payer: 59

## 2017-06-01 ENCOUNTER — Ambulatory Visit (HOSPITAL_BASED_OUTPATIENT_CLINIC_OR_DEPARTMENT_OTHER): Payer: 59 | Admitting: Family

## 2017-06-01 VITALS — BP 117/74 | HR 65 | Temp 97.9°F | Resp 20 | Wt 252.0 lb

## 2017-06-01 DIAGNOSIS — D508 Other iron deficiency anemias: Secondary | ICD-10-CM | POA: Diagnosis not present

## 2017-06-01 DIAGNOSIS — K909 Intestinal malabsorption, unspecified: Secondary | ICD-10-CM

## 2017-06-01 DIAGNOSIS — D510 Vitamin B12 deficiency anemia due to intrinsic factor deficiency: Secondary | ICD-10-CM | POA: Diagnosis not present

## 2017-06-01 DIAGNOSIS — Z9884 Bariatric surgery status: Secondary | ICD-10-CM | POA: Diagnosis not present

## 2017-06-01 LAB — CMP (CANCER CENTER ONLY)
ALT(SGPT): 18 U/L (ref 10–47)
AST: 27 U/L (ref 11–38)
Albumin: 3.6 g/dL (ref 3.3–5.5)
Alkaline Phosphatase: 129 U/L — ABNORMAL HIGH (ref 26–84)
BUN, Bld: 17 mg/dL (ref 7–22)
CO2: 31 meq/L (ref 18–33)
Calcium: 9.4 mg/dL (ref 8.0–10.3)
Chloride: 104 meq/L (ref 98–108)
Creat: 0.9 mg/dL (ref 0.6–1.2)
Glucose, Bld: 102 mg/dL (ref 73–118)
Potassium: 4.1 meq/L (ref 3.3–4.7)
Sodium: 146 meq/L — ABNORMAL HIGH (ref 128–145)
Total Bilirubin: 0.5 mg/dL (ref 0.20–1.60)
Total Protein: 7.3 g/dL (ref 6.4–8.1)

## 2017-06-01 LAB — CBC WITH DIFFERENTIAL (CANCER CENTER ONLY)
BASO#: 0 10e3/uL (ref 0.0–0.2)
BASO%: 0.1 % (ref 0.0–2.0)
EOS%: 1.8 % (ref 0.0–7.0)
Eosinophils Absolute: 0.1 10e3/uL (ref 0.0–0.5)
HCT: 38.8 % (ref 34.8–46.6)
HGB: 12.7 g/dL (ref 11.6–15.9)
LYMPH#: 2.6 10e3/uL (ref 0.9–3.3)
LYMPH%: 36.1 % (ref 14.0–48.0)
MCH: 28 pg (ref 26.0–34.0)
MCHC: 32.7 g/dL (ref 32.0–36.0)
MCV: 86 fL (ref 81–101)
MONO#: 0.8 10e3/uL (ref 0.1–0.9)
MONO%: 10.6 % (ref 0.0–13.0)
NEUT#: 3.8 10e3/uL (ref 1.5–6.5)
NEUT%: 51.4 % (ref 39.6–80.0)
Platelets: 286 10e3/uL (ref 145–400)
RBC: 4.54 10e6/uL (ref 3.70–5.32)
WBC: 7.3 10e3/uL (ref 3.9–10.0)

## 2017-06-01 NOTE — Progress Notes (Signed)
Hematology and Oncology Follow Up Visit  Brooke French [id number] [birth year] 56 y.o. 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 She is doing well and has responded nicely to the IV iron she received in July. She is asymptomatic at this time and has no complaints.  No fever, chills, n/v, cough, rash, dizziness, SOB, chest pain, palpitations, abdominal pain or changes in bowel or bladder habits.  No swelling, tenderness, numbness or tingling in her extremities. No c/o pain.  She has maintained a good appetite and is staying well hydrated. Her weight is stable.   ECOG Performance Status: 0 - Asymptomatic  Medications:  Allergies as of 06/01/2017   No Known Allergies     Medication List       Accurate as of 06/01/17  3:14 PM. Always use your most recent med list.          Biotin 5000 MCG Caps Take by mouth daily.   buPROPion 150 MG 24 hr tablet Commonly known as:  WELLBUTRIN XL Take 150 mg by mouth daily.   ESTROVEN PO Take by mouth.   FLUoxetine 40 MG capsule Commonly known as:  PROZAC Take 40 mg by mouth daily.   levothyroxine 137 MCG tablet Commonly known as:  SYNTHROID, LEVOTHROID Take 137 mcg by mouth daily.   loratadine 10 MG tablet Commonly known as:  CLARITIN Take 10 mg by mouth daily.   multivitamin with minerals tablet Take 1 tablet by mouth daily.   ondansetron 4 MG tablet Commonly known as:  ZOFRAN Take 1 tablet (4 mg total) by mouth every 8 (eight) hours as needed for nausea or vomiting.   oxyCODONE-acetaminophen 7.5-325 MG tablet Commonly known as:  PERCOCET Take 1-2 tabs po q4-6 hours prn pain   temazepam 30 MG capsule Commonly known as:  RESTORIL Take 30 mg by mouth at bedtime as needed for sleep.       Allergies: No Known Allergies  Past  Medical History, Surgical history, Social history, and Family History were reviewed and updated.  Review of Systems: All other 10 point review of systems is negative.   Physical Exam:  weight is 252 lb (114.3 kg). Her oral temperature is 97.9 F (36.6 C). Her blood pressure is 117/74 and her pulse is 65. Her respiration is 20 and oxygen saturation is 97%.   Wt Readings from Last 3 Encounters:  06/01/17 252 lb (114.3 kg)  04/16/17 250 lb (113.4 kg)  06/03/15 246 lb 6 oz (111.8 kg)    Ocular: Sclerae unicteric, pupils equal, round and reactive to light Ear-nose-throat: Oropharynx clear, dentition fair Lymphatic: No cervical, supraclavicular or axillary adenopathy Lungs no rales or rhonchi, good excursion bilaterally Heart regular rate and rhythm, no murmur appreciated Abd soft, nontender, positive bowel sounds, no liver or spleen tip palpated on exam, no fluid wave  MSK no focal spinal tenderness, no joint edema Neuro: non-focal, well-oriented, appropriate affect Breasts: Deferred   Lab Results  Component Value Date   WBC 7.3 06/01/2017   HGB 12.7 06/01/2017   HCT 38.8 06/01/2017   MCV 86 06/01/2017   PLT 286 06/01/2017   Lab Results  Component Value Date   FERRITIN 7 (L) 04/16/2017   IRON 24 (L) 04/16/2017   TIBC 498 (H) 04/16/2017   UIBC 474 (H)  04/16/2017   IRONPCTSAT 5 (L) 04/16/2017   Lab Results  Component Value Date   RBC 4.54 06/01/2017   No results found for: KPAFRELGTCHN, LAMBDASER, KAPLAMBRATIO No results found for: IGGSERUM, IGA, IGMSERUM No results found for: Marda Stalker, SPEI   Chemistry      Component Value Date/Time   NA 135 04/16/2017 1503   K 3.8 04/16/2017 1503   CL 97 04/16/2017 1503   CO2 26 04/16/2017 1503   BUN 13 04/16/2017 1503   CREATININE 0.71 04/16/2017 1503      Component Value Date/Time   CALCIUM 9.7 04/16/2017 1503   ALKPHOS 163 (H) 04/16/2017 1503   AST 16 04/16/2017 1503     ALT 15 04/16/2017 1503   BILITOT 0.2 04/16/2017 1503      Impression and Plan: Brooke French is a very pleasant 56 yo caucasian female with any questions or concerns. She responded nicely to the 2 doses of IV iron she received in July. She is asymptomatic at this time.  We will see what her iron studies show and bring her back in later this week for an infusion if needed.  We will plan to see her back again in 3 months for repeat lab work and follow-up.  She will contact our office with any questions or concerns. We can certainly see her sooner if need be.   Verdie Mosher, NP 8/17/20183:14 PM

## 2017-06-02 LAB — VITAMIN B12: Vitamin B12: 455 pg/mL (ref 232–1245)

## 2017-06-02 LAB — RETICULOCYTES: RETICULOCYTE COUNT: 1.1 % (ref 0.6–2.6)

## 2017-06-04 LAB — IRON AND TIBC
%SAT: 19 % — ABNORMAL LOW (ref 21–57)
Iron: 61 ug/dL (ref 41–142)
TIBC: 324 ug/dL (ref 236–444)
UIBC: 263 ug/dL (ref 120–384)

## 2017-06-04 LAB — FERRITIN: FERRITIN: 118 ng/mL (ref 9–269)

## 2017-06-05 DIAGNOSIS — M47816 Spondylosis without myelopathy or radiculopathy, lumbar region: Secondary | ICD-10-CM | POA: Diagnosis not present

## 2017-06-11 ENCOUNTER — Ambulatory Visit (HOSPITAL_BASED_OUTPATIENT_CLINIC_OR_DEPARTMENT_OTHER): Payer: 59

## 2017-06-11 VITALS — BP 109/49 | HR 63 | Temp 98.2°F | Resp 20

## 2017-06-11 DIAGNOSIS — D508 Other iron deficiency anemias: Secondary | ICD-10-CM | POA: Diagnosis not present

## 2017-06-11 DIAGNOSIS — K909 Intestinal malabsorption, unspecified: Secondary | ICD-10-CM

## 2017-06-11 DIAGNOSIS — Z9884 Bariatric surgery status: Secondary | ICD-10-CM

## 2017-06-11 MED ORDER — FERUMOXYTOL INJECTION 510 MG/17 ML
510.0000 mg | Freq: Once | INTRAVENOUS | Status: AC
  Administered 2017-06-11: 510 mg via INTRAVENOUS
  Filled 2017-06-11: qty 17

## 2017-06-11 MED FILL — FLUoxetine HCL 40 MG CAPS: 40 | 90 days supply | Qty: 90 | Fill #1

## 2017-06-11 NOTE — Patient Instructions (Signed)

## 2017-07-06 ENCOUNTER — Other Ambulatory Visit (HOSPITAL_COMMUNITY): Payer: Self-pay | Admitting: Physical Medicine and Rehabilitation

## 2017-07-06 DIAGNOSIS — M545 Low back pain: Principal | ICD-10-CM

## 2017-07-06 DIAGNOSIS — G8929 Other chronic pain: Secondary | ICD-10-CM

## 2017-07-16 ENCOUNTER — Ambulatory Visit (HOSPITAL_COMMUNITY)
Admission: RE | Admit: 2017-07-16 | Discharge: 2017-07-16 | Disposition: A | Discharge status: Home / Self Care | Payer: 59 | Source: Ambulatory Visit | Attending: Physical Medicine and Rehabilitation | Admitting: Physical Medicine and Rehabilitation

## 2017-07-16 DIAGNOSIS — G8929 Other chronic pain: Secondary | ICD-10-CM | POA: Insufficient documentation

## 2017-07-16 DIAGNOSIS — M4326 Fusion of spine, lumbar region: Secondary | ICD-10-CM | POA: Diagnosis not present

## 2017-07-16 DIAGNOSIS — M545 Low back pain: Secondary | ICD-10-CM | POA: Diagnosis not present

## 2017-07-16 DIAGNOSIS — M47896 Other spondylosis, lumbar region: Secondary | ICD-10-CM | POA: Insufficient documentation

## 2017-07-16 DIAGNOSIS — M4856XA Collapsed vertebra, not elsewhere classified, lumbar region, initial encounter for fracture: Secondary | ICD-10-CM | POA: Diagnosis not present

## 2017-07-16 DIAGNOSIS — M4316 Spondylolisthesis, lumbar region: Secondary | ICD-10-CM | POA: Insufficient documentation

## 2017-07-16 MED FILL — LEVOTHYROXINE 137 MCG TAB: 137 | 90 days supply | Qty: 90 | Fill #1

## 2017-07-17 DIAGNOSIS — M25562 Pain in left knee: Secondary | ICD-10-CM | POA: Diagnosis not present

## 2017-08-06 MED FILL — BUPROPION HCL XL 150 MG TAB: 150 | 90 days supply | Qty: 90 | Fill #1

## 2017-08-07 DIAGNOSIS — M47816 Spondylosis without myelopathy or radiculopathy, lumbar region: Secondary | ICD-10-CM | POA: Diagnosis not present

## 2017-08-07 MED FILL — TEMAZEPAM 30 MG CAPSULE: 30 | 90 days supply | Qty: 90 | Fill #0

## 2017-08-22 DIAGNOSIS — M47816 Spondylosis without myelopathy or radiculopathy, lumbar region: Secondary | ICD-10-CM | POA: Diagnosis not present

## 2017-09-05 ENCOUNTER — Other Ambulatory Visit (HOSPITAL_BASED_OUTPATIENT_CLINIC_OR_DEPARTMENT_OTHER): Payer: 59

## 2017-09-05 ENCOUNTER — Ambulatory Visit (HOSPITAL_BASED_OUTPATIENT_CLINIC_OR_DEPARTMENT_OTHER): Payer: 59 | Admitting: Family

## 2017-09-05 ENCOUNTER — Other Ambulatory Visit: Payer: Self-pay

## 2017-09-05 VITALS — BP 116/59 | HR 69 | Temp 98.1°F | Resp 20 | Wt 251.2 lb

## 2017-09-05 DIAGNOSIS — D508 Other iron deficiency anemias: Secondary | ICD-10-CM

## 2017-09-05 LAB — CMP (CANCER CENTER ONLY)
ALK PHOS: 108 U/L — AB (ref 26–84)
ALT: 33 U/L (ref 10–47)
AST: 26 U/L (ref 11–38)
Albumin: 3.5 g/dL (ref 3.3–5.5)
BILIRUBIN TOTAL: 0.6 mg/dL (ref 0.20–1.60)
BUN, Bld: 19 mg/dL (ref 7–22)
CO2: 31 mEq/L (ref 18–33)
Calcium: 9.6 mg/dL (ref 8.0–10.3)
Chloride: 104 mEq/L (ref 98–108)
Creat: 0.9 mg/dl (ref 0.6–1.2)
Glucose, Bld: 100 mg/dL (ref 73–118)
POTASSIUM: 4.3 meq/L (ref 3.3–4.7)
Sodium: 151 mEq/L — ABNORMAL HIGH (ref 128–145)
Total Protein: 7.1 g/dL (ref 6.4–8.1)

## 2017-09-05 LAB — CBC WITH DIFFERENTIAL (CANCER CENTER ONLY)
BASO#: 0 10*3/uL (ref 0.0–0.2)
BASO%: 0.1 % (ref 0.0–2.0)
EOS%: 1.1 % (ref 0.0–7.0)
Eosinophils Absolute: 0.1 10*3/uL (ref 0.0–0.5)
HCT: 41 % (ref 34.8–46.6)
HGB: 13.9 g/dL (ref 11.6–15.9)
LYMPH#: 2.8 10*3/uL (ref 0.9–3.3)
LYMPH%: 30.5 % (ref 14.0–48.0)
MCH: 32.3 pg (ref 26.0–34.0)
MCHC: 33.9 g/dL (ref 32.0–36.0)
MCV: 95 fL (ref 81–101)
MONO#: 0.8 10*3/uL (ref 0.1–0.9)
MONO%: 8.8 % (ref 0.0–13.0)
NEUT#: 5.5 10*3/uL (ref 1.5–6.5)
NEUT%: 59.5 % (ref 39.6–80.0)
PLATELETS: 263 10*3/uL (ref 145–400)
RBC: 4.3 10*6/uL (ref 3.70–5.32)
RDW: 13.9 % (ref 11.1–15.7)
WBC: 9.2 10*3/uL (ref 3.9–10.0)

## 2017-09-05 NOTE — Progress Notes (Signed)
Hematology and Oncology Follow Up Visit  Brooke French [id number] 1961-03-27 56 y.o. 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 She is doing well and has no complaints at this time. Hgb is now 13.9 with an MCV of 95. Iron studies are pending.  She is excited for Thanksgiving and she will also be going to the beach this weekend.  No episodes of bleeding, bruising or petechiae. No lymphadenopathy found on exam.  No fever, chills, n/v, cough, rash, dizziness, SOB, chest pain, palpitations, abdominal pain or changes in bowel or bladder habits.  No swelling, tenderness, numbness or tingling in her extremities.  She has maintained a good appetite and is staying well hydrated. Her weight is stable.   ECOG Performance Status: 1 - Symptomatic but completely ambulatory  Medications:  Allergies as of 09/05/2017   No Known Allergies     Medication List        Accurate as of 09/05/17  3:14 PM. Always use your most recent med list.          Biotin 5000 MCG Caps Take by mouth daily.   buPROPion 150 MG 24 hr tablet Commonly known as:  WELLBUTRIN XL Take 150 mg by mouth daily.   ESTROVEN PO Take by mouth.   FLUoxetine 40 MG capsule Commonly known as:  PROZAC Take 40 mg by mouth daily.   levothyroxine 137 MCG tablet Commonly known as:  SYNTHROID, LEVOTHROID Take 137 mcg by mouth daily.   loratadine 10 MG tablet Commonly known as:  CLARITIN Take 10 mg by mouth daily.   multivitamin with minerals tablet Take 1 tablet by mouth daily.   naproxen sodium 220 MG tablet Commonly known as:  ALEVE Take 220 mg by mouth. 1-2 per day PRN   oxyCODONE-acetaminophen 7.5-325 MG tablet Commonly known as:  PERCOCET Take 1-2 tabs po q4-6 hours prn pain   temazepam 30 MG capsule Commonly  known as:  RESTORIL Take 30 mg by mouth at bedtime as needed for sleep.       Allergies: No Known Allergies  Past Medical History, Surgical history, Social history, and Family History were reviewed and updated.  Review of Systems: All other 10 point review of systems is negative.   Physical Exam:  weight is 251 lb 4 oz (114 kg). Her oral temperature is 98.1 F (36.7 C). Her blood pressure is 116/59 (abnormal) and her pulse is 69. Her respiration is 20 and oxygen saturation is 97%.   Wt Readings from Last 3 Encounters:  09/05/17 251 lb 4 oz (114 kg)  06/01/17 252 lb (114.3 kg)  04/16/17 250 lb (113.4 kg)    Ocular: Sclerae unicteric, pupils equal, round and reactive to light Ear-nose-throat: Oropharynx clear, dentition fair Lymphatic: No cervical, supraclavicular or axillary adenopathy Lungs no rales or rhonchi, good excursion bilaterally Heart regular rate and rhythm, no murmur appreciated Abd soft, nontender, positive bowel sounds, no liver or spleen tip palpated on exam, no fluid wave  MSK no focal spinal tenderness, no joint edema Neuro: non-focal, well-oriented, appropriate affect Breasts: Deferred   Lab Results  Component Value Date   WBC 9.2 09/05/2017   HGB 13.9 09/05/2017   HCT 41.0 09/05/2017   MCV 95 09/05/2017   PLT 263 09/05/2017   Lab Results  Component Value Date  FERRITIN 118 06/01/2017   IRON 61 06/01/2017   TIBC 324 06/01/2017   UIBC 263 06/01/2017   IRONPCTSAT 19 (L) 06/01/2017   Lab Results  Component Value Date   RBC 4.30 09/05/2017   No results found for: KPAFRELGTCHN, LAMBDASER, KAPLAMBRATIO No results found for: IGGSERUM, IGA, IGMSERUM No results found for: Dorene ArOTALPROTELP, ALBUMINELP, A1GS, A2GS, Colin BentonBETS, BETA2SER, GAMS, MSPIKE, SPEI   Chemistry      Component Value Date/Time   NA 151 (H) 09/05/2017 1445   K 4.3 09/05/2017 1445   CL 104 09/05/2017 1445   CO2 31 09/05/2017 1445   BUN 19 09/05/2017 1445   CREATININE 0.9 09/05/2017 1445        Component Value Date/Time   CALCIUM 9.6 09/05/2017 1445   ALKPHOS 108 (H) 09/05/2017 1445   AST 26 09/05/2017 1445   ALT 33 09/05/2017 1445   BILITOT 0.60 09/05/2017 1445      Impression and Plan: Brooke French is a very pleasant 56 yo caucasian female with iron deficiency anemia. She has responded nicely to IV iron and is feeling much better. No complaints at this time.  We will see what her iron studies show and bring her back in next week for infusion if needed.  We will go ahead and plan to see her back again in another 4 months for follow-up.  She will contact our office with any questions or concerns. We can certainly see her sooner if need be.   Verdie MosherINCINNATI,SARAH M, NP 11/21/20183:14 PM

## 2017-09-06 LAB — RETICULOCYTES: Reticulocyte Count: 1.7 % (ref 0.6–2.6)

## 2017-09-07 LAB — IRON AND TIBC
%SAT: 14 % — ABNORMAL LOW (ref 21–57)
IRON: 48 ug/dL (ref 41–142)
TIBC: 333 ug/dL (ref 236–444)
UIBC: 285 ug/dL (ref 120–384)

## 2017-09-07 LAB — FERRITIN: FERRITIN: 202 ng/mL (ref 9–269)

## 2017-09-12 MED FILL — FLUoxetine HCL 40 MG CAPS: 40 | 90 days supply | Qty: 90 | Fill #2

## 2017-09-19 ENCOUNTER — Other Ambulatory Visit: Payer: Self-pay

## 2017-09-19 ENCOUNTER — Ambulatory Visit (HOSPITAL_BASED_OUTPATIENT_CLINIC_OR_DEPARTMENT_OTHER): Payer: 59

## 2017-09-19 VITALS — BP 113/55 | HR 69 | Temp 97.7°F | Resp 18

## 2017-09-19 DIAGNOSIS — D508 Other iron deficiency anemias: Secondary | ICD-10-CM

## 2017-09-19 DIAGNOSIS — Z9884 Bariatric surgery status: Secondary | ICD-10-CM

## 2017-09-19 DIAGNOSIS — K909 Intestinal malabsorption, unspecified: Secondary | ICD-10-CM

## 2017-09-19 MED ORDER — SODIUM CHLORIDE 0.9% FLUSH
3.0000 mL | Freq: Once | INTRAVENOUS | Status: DC | PRN
  Filled 2017-09-19: qty 10

## 2017-09-19 MED ORDER — SODIUM CHLORIDE 0.9% FLUSH
10.0000 mL | INTRAVENOUS | Status: DC | PRN
  Filled 2017-09-19: qty 10

## 2017-09-19 MED ORDER — SODIUM CHLORIDE 0.9 % IV SOLN
510.0000 mg | Freq: Once | INTRAVENOUS | Status: AC
  Administered 2017-09-19: 510 mg via INTRAVENOUS
  Filled 2017-09-19: qty 17

## 2017-09-19 NOTE — Patient Instructions (Signed)

## 2017-10-22 MED FILL — LEVOTHYROXINE 137 MCG TABLE: 137 | 90 days supply | Qty: 90 | Fill #2

## 2017-11-07 DIAGNOSIS — M19041 Primary osteoarthritis, right hand: Secondary | ICD-10-CM | POA: Diagnosis not present

## 2017-11-07 DIAGNOSIS — M1812 Unilateral primary osteoarthritis of first carpometacarpal joint, left hand: Secondary | ICD-10-CM | POA: Diagnosis not present

## 2017-11-07 MED FILL — TEMAZEPAM 30 MG CAPSULE: 30 | 90 days supply | Qty: 90 | Fill #1

## 2017-11-07 MED FILL — buPROPion HCL ER (XL) 150 M: 150 | 90 days supply | Qty: 90 | Fill #2

## 2017-11-08 MED FILL — MELOXICAM 7.5 MG TABLET: 7.5 | 30 days supply | Qty: 30 | Fill #0

## 2017-11-13 ENCOUNTER — Other Ambulatory Visit: Payer: Self-pay | Admitting: Family

## 2017-11-13 DIAGNOSIS — D508 Other iron deficiency anemias: Secondary | ICD-10-CM

## 2017-11-13 DIAGNOSIS — E559 Vitamin D deficiency, unspecified: Secondary | ICD-10-CM

## 2017-11-13 DIAGNOSIS — D510 Vitamin B12 deficiency anemia due to intrinsic factor deficiency: Secondary | ICD-10-CM

## 2017-11-14 ENCOUNTER — Inpatient Hospital Stay: Payer: 59 | Attending: Hematology & Oncology

## 2017-11-14 ENCOUNTER — Other Ambulatory Visit: Payer: Self-pay | Admitting: *Deleted

## 2017-11-14 DIAGNOSIS — D508 Other iron deficiency anemias: Secondary | ICD-10-CM | POA: Diagnosis not present

## 2017-11-14 LAB — CBC WITH DIFFERENTIAL (CANCER CENTER ONLY)
BASOS ABS: 0 10*3/uL (ref 0.0–0.1)
BASOS PCT: 0 %
EOS ABS: 0.2 10*3/uL (ref 0.0–0.5)
EOS PCT: 2 %
HEMATOCRIT: 41.4 % (ref 34.8–46.6)
Hemoglobin: 13.8 g/dL (ref 11.6–15.9)
Lymphocytes Relative: 38 %
Lymphs Abs: 2.9 10*3/uL (ref 0.9–3.3)
MCH: 32.3 pg (ref 26.0–34.0)
MCHC: 33.3 g/dL (ref 32.0–36.0)
MCV: 97 fL (ref 81.0–101.0)
MONO ABS: 0.8 10*3/uL (ref 0.1–0.9)
Monocytes Relative: 10 %
NEUTROS ABS: 3.8 10*3/uL (ref 1.5–6.5)
Neutrophils Relative %: 50 %
PLATELETS: 284 10*3/uL (ref 145–400)
RBC: 4.27 MIL/uL (ref 3.70–5.32)
RDW: 12.8 % (ref 11.1–15.7)
WBC Count: 7.7 10*3/uL (ref 3.9–10.3)

## 2017-11-15 LAB — IRON AND TIBC
IRON: 69 ug/dL (ref 41–142)
SATURATION RATIOS: 24 % (ref 21–57)
TIBC: 295 ug/dL (ref 236–444)
UIBC: 226 ug/dL

## 2017-11-15 LAB — FERRITIN: Ferritin: 326 ng/mL — ABNORMAL HIGH (ref 9–269)

## 2017-11-16 ENCOUNTER — Other Ambulatory Visit: Payer: Self-pay | Admitting: Internal Medicine

## 2017-11-16 DIAGNOSIS — Z1231 Encounter for screening mammogram for malignant neoplasm of breast: Secondary | ICD-10-CM

## 2017-11-19 ENCOUNTER — Other Ambulatory Visit: Payer: Self-pay | Admitting: Family

## 2017-11-19 ENCOUNTER — Inpatient Hospital Stay: Payer: 59 | Attending: Hematology & Oncology

## 2017-11-19 DIAGNOSIS — D508 Other iron deficiency anemias: Secondary | ICD-10-CM | POA: Insufficient documentation

## 2017-11-19 DIAGNOSIS — D510 Vitamin B12 deficiency anemia due to intrinsic factor deficiency: Secondary | ICD-10-CM

## 2017-11-19 DIAGNOSIS — E559 Vitamin D deficiency, unspecified: Secondary | ICD-10-CM

## 2017-11-19 LAB — IRON AND TIBC
Iron: 64 ug/dL (ref 28–170)
Saturation Ratios: 20 % (ref 10.4–31.8)
TIBC: 322 ug/dL (ref 250–450)
UIBC: 258 ug/dL

## 2017-11-19 LAB — CBC WITH DIFFERENTIAL (CANCER CENTER ONLY)
Basophils Absolute: 0 10*3/uL (ref 0.0–0.1)
Basophils Relative: 0 %
Eosinophils Absolute: 0.1 10*3/uL (ref 0.0–0.5)
Eosinophils Relative: 2 %
HCT: 43.3 % (ref 34.8–46.6)
Hemoglobin: 14.3 g/dL (ref 11.6–15.9)
LYMPHS ABS: 2.3 10*3/uL (ref 0.9–3.3)
LYMPHS PCT: 31 %
MCH: 31.9 pg (ref 26.0–34.0)
MCHC: 33 g/dL (ref 32.0–36.0)
MCV: 96.7 fL (ref 81.0–101.0)
MONO ABS: 0.7 10*3/uL (ref 0.1–0.9)
MONOS PCT: 9 %
Neutro Abs: 4.2 10*3/uL (ref 1.5–6.5)
Neutrophils Relative %: 58 %
Platelet Count: 277 10*3/uL (ref 145–400)
RBC: 4.48 MIL/uL (ref 3.70–5.32)
RDW: 12.7 % (ref 11.1–15.7)
WBC Count: 7.3 10*3/uL (ref 3.9–10.0)

## 2017-11-19 LAB — FERRITIN: Ferritin: 246 ng/mL (ref 11–307)

## 2017-11-19 LAB — VITAMIN B12: Vitamin B-12: 368 pg/mL (ref 180–914)

## 2017-11-20 ENCOUNTER — Other Ambulatory Visit: Payer: Self-pay | Admitting: Family

## 2017-11-20 DIAGNOSIS — E559 Vitamin D deficiency, unspecified: Secondary | ICD-10-CM

## 2017-11-20 LAB — VITAMIN D 25 HYDROXY (VIT D DEFICIENCY, FRACTURES): VIT D 25 HYDROXY: 16.6 ng/mL — AB (ref 30.0–100.0)

## 2017-11-20 MED ORDER — ERGOCALCIFEROL 1.25 MG (50000 UT) PO CAPS: 50000.0000 [IU] | ORAL_CAPSULE | ORAL | 6 refills | Status: DC

## 2017-11-20 MED FILL — VIT D2 1.25 MG (50,000 UNIT: 1.25 MG | 56 days supply | Qty: 8 | Fill #0

## 2017-11-21 ENCOUNTER — Telehealth: Payer: Self-pay | Admitting: *Deleted

## 2017-11-21 NOTE — Telephone Encounter (Addendum)
Patient is aware of results and new appointment.   ----- Message from Verdie MosherSarah M [place], NP sent at 11/20/2017  3:57 PM EST ----- Regarding: Vit D low Iron studies and B 12 look good. Vitamin D is quite low. We discussed this being a possibility over the phone last week. [facility] for Vit D 50,000 units weekly. Thank you!  Sarah  ----- Message ----- From: Leory PlowmanInterface, [facility] [place] Sent: 11/19/2017   2:39 PM To: Verdie MosherSarah M [place], NP

## 2017-12-12 MED FILL — FLUoxetine HCL 40 MG CAPS: 40 | 90 days supply | Qty: 90 | Fill #3

## 2017-12-14 ENCOUNTER — Ambulatory Visit
Admission: RE | Admit: 2017-12-14 | Discharge: 2017-12-14 | Disposition: A | Discharge status: Home / Self Care | Payer: 59 | Source: Ambulatory Visit | Attending: Internal Medicine | Admitting: Internal Medicine

## 2017-12-14 DIAGNOSIS — Z1231 Encounter for screening mammogram for malignant neoplasm of breast: Secondary | ICD-10-CM

## 2018-01-03 ENCOUNTER — Inpatient Hospital Stay (HOSPITAL_BASED_OUTPATIENT_CLINIC_OR_DEPARTMENT_OTHER): Payer: 59 | Admitting: Family

## 2018-01-03 ENCOUNTER — Other Ambulatory Visit: Payer: Self-pay

## 2018-01-03 ENCOUNTER — Encounter: Payer: Self-pay | Admitting: Family

## 2018-01-03 ENCOUNTER — Inpatient Hospital Stay: Payer: 59 | Attending: Hematology & Oncology

## 2018-01-03 VITALS — BP 120/71 | HR 66 | Temp 97.7°F | Resp 18 | Wt 251.0 lb

## 2018-01-03 DIAGNOSIS — Z9884 Bariatric surgery status: Secondary | ICD-10-CM | POA: Insufficient documentation

## 2018-01-03 DIAGNOSIS — Z9889 Other specified postprocedural states: Secondary | ICD-10-CM | POA: Diagnosis not present

## 2018-01-03 DIAGNOSIS — Z79899 Other long term (current) drug therapy: Secondary | ICD-10-CM

## 2018-01-03 DIAGNOSIS — K912 Postsurgical malabsorption, not elsewhere classified: Secondary | ICD-10-CM | POA: Insufficient documentation

## 2018-01-03 DIAGNOSIS — R5383 Other fatigue: Secondary | ICD-10-CM | POA: Diagnosis not present

## 2018-01-03 DIAGNOSIS — Z7984 Long term (current) use of oral hypoglycemic drugs: Secondary | ICD-10-CM | POA: Diagnosis not present

## 2018-01-03 DIAGNOSIS — D509 Iron deficiency anemia, unspecified: Secondary | ICD-10-CM | POA: Insufficient documentation

## 2018-01-03 DIAGNOSIS — D508 Other iron deficiency anemias: Secondary | ICD-10-CM

## 2018-01-03 DIAGNOSIS — E559 Vitamin D deficiency, unspecified: Secondary | ICD-10-CM

## 2018-01-03 LAB — CBC WITH DIFFERENTIAL (CANCER CENTER ONLY)
Basophils Absolute: 0 10*3/uL (ref 0.0–0.1)
Basophils Relative: 0 %
EOS ABS: 0.1 10*3/uL (ref 0.0–0.5)
Eosinophils Relative: 2 %
HCT: 42 % (ref 34.8–46.6)
Hemoglobin: 13.9 g/dL (ref 11.6–15.9)
LYMPHS ABS: 2 10*3/uL (ref 0.9–3.3)
Lymphocytes Relative: 30 %
MCH: 31.7 pg (ref 26.0–34.0)
MCHC: 33.1 g/dL (ref 32.0–36.0)
MCV: 95.7 fL (ref 81.0–101.0)
MONO ABS: 0.7 10*3/uL (ref 0.1–0.9)
Monocytes Relative: 10 %
Neutro Abs: 4 10*3/uL (ref 1.5–6.5)
Neutrophils Relative %: 58 %
PLATELETS: 288 10*3/uL (ref 145–400)
RBC: 4.39 MIL/uL (ref 3.70–5.32)
RDW: 13 % (ref 11.1–15.7)
WBC: 6.8 10*3/uL (ref 3.9–10.0)

## 2018-01-03 LAB — CMP (CANCER CENTER ONLY)
ALT: 30 U/L (ref 10–47)
AST: 28 U/L (ref 11–38)
Albumin: 3.6 g/dL (ref 3.5–5.0)
Alkaline Phosphatase: 126 U/L — ABNORMAL HIGH (ref 26–84)
Anion gap: 8 (ref 5–15)
BILIRUBIN TOTAL: 0.5 mg/dL (ref 0.2–1.6)
BUN: 17 mg/dL (ref 7–22)
CALCIUM: 9.6 mg/dL (ref 8.0–10.3)
CO2: 29 mmol/L (ref 18–33)
CREATININE: 0.5 mg/dL — AB (ref 0.60–1.20)
Chloride: 106 mmol/L (ref 98–108)
Glucose, Bld: 116 mg/dL (ref 73–118)
Potassium: 4.2 mmol/L (ref 3.3–4.7)
SODIUM: 143 mmol/L (ref 128–145)
TOTAL PROTEIN: 7 g/dL (ref 6.4–8.1)

## 2018-01-03 NOTE — Progress Notes (Signed)
Hematology and Oncology Follow Up Visit  Brooke RichterColleen F French [id number] Apr 09, 1961 57 y.o. 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 She is doing well but had a spell in the lab with dizziness and nausea after her blood was taken. Once in a recliner with her feet up her symptoms resolved and she is feeling back to normal now. Hgb is 13.9 with an MCV 95. Blood glucose 116.  She states that her fatigue has improved since she received IV iron in December.  She has had no episodes of bleeding, no bruising in excess or petechiae.  No fever, chills, n/v, cough, rash, dizziness, SOB, chest pain, palpitations, abdominal pain or changes in bowel or bladder habits.  No swelling, tenderness, numbness or tingling in her extremities. No lymphadenopathy found on exam.  She has maintained a good appetite and is staying well hydrated. Her weight is stable.   ECOG Performance Status: 1 - Symptomatic but completely ambulatory  Medications:  Allergies as of 01/03/2018   No Known Allergies     Medication List        Accurate as of 01/03/18  3:42 PM. Always use your most recent med list.          Biotin 5000 MCG Caps Take by mouth daily.   buPROPion 150 MG 24 hr tablet Commonly known as:  WELLBUTRIN XL Take 150 mg by mouth daily.   ergocalciferol 50000 units capsule Commonly known as:  VITAMIN D2 Take 1 capsule (50,000 Units total) by mouth once a week.   ESTROVEN PO Take by mouth.   FLUoxetine 40 MG capsule Commonly known as:  PROZAC Take 40 mg by mouth daily.   levothyroxine 137 MCG tablet Commonly known as:  SYNTHROID, LEVOTHROID Take 137 mcg by mouth daily.   meloxicam 7.5 MG tablet Commonly known as:  MOBIC Take 7.5 mg by mouth daily.   multivitamin with minerals tablet Take 1  tablet by mouth daily.   naproxen sodium 220 MG tablet Commonly known as:  ALEVE Take 220 mg by mouth. 1-2 per day PRN   oxyCODONE-acetaminophen 7.5-325 MG tablet Commonly known as:  PERCOCET Take 1-2 tabs po q4-6 hours prn pain   temazepam 30 MG capsule Commonly known as:  RESTORIL Take 30 mg by mouth at bedtime as needed for sleep.       Allergies: No Known Allergies  Past Medical History, Surgical history, Social history, and Family History were reviewed and updated.  Review of Systems: All other 10 point review of systems is negative.   Physical Exam:  weight is 251 lb (113.9 kg). Her oral temperature is 97.7 F (36.5 C). Her blood pressure is 120/71 and her pulse is 66. Her respiration is 18 and oxygen saturation is 96%.   Wt Readings from Last 3 Encounters:  01/03/18 251 lb (113.9 kg)  09/05/17 251 lb 4 oz (114 kg)  06/01/17 252 lb (114.3 kg)    Ocular: Sclerae unicteric, pupils equal, round and reactive to light Ear-nose-throat: Oropharynx clear, dentition fair Lymphatic: No cervical, supraclavicular or axillary adenopathy Lungs no rales or rhonchi, good excursion bilaterally Heart regular rate and rhythm, no murmur appreciated Abd soft, nontender, positive bowel sounds, no liver or spleen tip palpated on exam, no fluid wave  MSK no focal spinal tenderness, no joint  edema Neuro: non-focal, well-oriented, appropriate affect Breasts: Deferred   Lab Results  Component Value Date   WBC 6.8 01/03/2018   HGB 13.9 09/05/2017   HCT 42.0 01/03/2018   MCV 95.7 01/03/2018   PLT 288 01/03/2018   Lab Results  Component Value Date   FERRITIN 246 11/19/2017   IRON 64 11/19/2017   TIBC 322 11/19/2017   UIBC 258 11/19/2017   IRONPCTSAT 20 11/19/2017   Lab Results  Component Value Date   RBC 4.39 01/03/2018   No results found for: KPAFRELGTCHN, LAMBDASER, KAPLAMBRATIO No results found for: IGGSERUM, IGA, IGMSERUM No results found for: Marda Stalker, SPEI   Chemistry      Component Value Date/Time   NA 143 01/03/2018 1457   NA 151 (H) 09/05/2017 1445   K 4.2 01/03/2018 1457   K 4.3 09/05/2017 1445   CL 106 01/03/2018 1457   CL 104 09/05/2017 1445   CO2 29 01/03/2018 1457   CO2 31 09/05/2017 1445   BUN 17 01/03/2018 1457   BUN 19 09/05/2017 1445   CREATININE 0.50 (L) 01/03/2018 1457   CREATININE 0.9 09/05/2017 1445      Component Value Date/Time   CALCIUM 9.6 01/03/2018 1457   CALCIUM 9.6 09/05/2017 1445   ALKPHOS 126 (H) 01/03/2018 1457   ALKPHOS 108 (H) 09/05/2017 1445   AST 28 01/03/2018 1457   ALT 30 01/03/2018 1457   ALT 33 09/05/2017 1445   BILITOT 0.5 01/03/2018 1457      Impression and Plan: Brooke French is a very pleasant 57 yo caucasian female with iron deficiency anemia secondary to malabsorption after gastric bypass. Her fatigue has improved since she received IV iron in December.  We will see what her iron studies show and bring her back in for infusion if needed.  We will go ahead and plan to see her back in another 4 months for follow-up.  She will contact our office with any questions or concerns. We can certainly see her sooner if need be.   Emeline Gins, NP 3/21/20193:42 PM

## 2018-01-04 LAB — RETICULOCYTES
RBC.: 4.34 MIL/uL (ref 3.70–5.45)
RETIC CT PCT: 1.9 % (ref 0.7–2.1)
Retic Count, Absolute: 82.5 10*3/uL (ref 33.7–90.7)

## 2018-01-04 LAB — IRON AND TIBC
Iron: 47 ug/dL (ref 41–142)
Saturation Ratios: 15 % — ABNORMAL LOW (ref 21–57)
TIBC: 308 ug/dL (ref 236–444)
UIBC: 260 ug/dL

## 2018-01-04 LAB — FERRITIN: Ferritin: 254 ng/mL (ref 9–269)

## 2018-01-09 DIAGNOSIS — R82998 Other abnormal findings in urine: Secondary | ICD-10-CM | POA: Diagnosis not present

## 2018-01-09 DIAGNOSIS — Z0000 Encounter for general adult medical examination without abnormal findings: Secondary | ICD-10-CM | POA: Diagnosis not present

## 2018-01-09 DIAGNOSIS — E7849 Other hyperlipidemia: Secondary | ICD-10-CM | POA: Diagnosis not present

## 2018-01-09 DIAGNOSIS — E038 Other specified hypothyroidism: Secondary | ICD-10-CM | POA: Diagnosis not present

## 2018-01-14 ENCOUNTER — Inpatient Hospital Stay: Payer: 59 | Attending: Hematology & Oncology

## 2018-01-14 ENCOUNTER — Other Ambulatory Visit: Payer: Self-pay

## 2018-01-14 VITALS — BP 124/59 | HR 72 | Temp 97.8°F | Resp 20

## 2018-01-14 DIAGNOSIS — D508 Other iron deficiency anemias: Secondary | ICD-10-CM | POA: Diagnosis not present

## 2018-01-14 DIAGNOSIS — K909 Intestinal malabsorption, unspecified: Secondary | ICD-10-CM

## 2018-01-14 DIAGNOSIS — Z9884 Bariatric surgery status: Secondary | ICD-10-CM

## 2018-01-14 MED ORDER — SODIUM CHLORIDE 0.9 % IV SOLN
INTRAVENOUS | Status: DC
  Administered 2018-01-14: 15:00:00 via INTRAVENOUS

## 2018-01-14 MED ORDER — SODIUM CHLORIDE 0.9 % IV SOLN
510.0000 mg | Freq: Once | INTRAVENOUS | Status: AC
  Administered 2018-01-14: 510 mg via INTRAVENOUS
  Filled 2018-01-14: qty 17

## 2018-01-14 NOTE — Patient Instructions (Signed)

## 2018-01-16 DIAGNOSIS — Z6841 Body Mass Index (BMI) 40.0 and over, adult: Secondary | ICD-10-CM | POA: Diagnosis not present

## 2018-01-16 DIAGNOSIS — Z0000 Encounter for general adult medical examination without abnormal findings: Secondary | ICD-10-CM | POA: Diagnosis not present

## 2018-01-16 DIAGNOSIS — E785 Hyperlipidemia, unspecified: Secondary | ICD-10-CM | POA: Diagnosis not present

## 2018-01-16 DIAGNOSIS — E039 Hypothyroidism, unspecified: Secondary | ICD-10-CM | POA: Diagnosis not present

## 2018-01-16 DIAGNOSIS — Z1389 Encounter for screening for other disorder: Secondary | ICD-10-CM | POA: Diagnosis not present

## 2018-01-16 DIAGNOSIS — F329 Major depressive disorder, single episode, unspecified: Secondary | ICD-10-CM | POA: Diagnosis not present

## 2018-01-16 MED FILL — ATORVASTATIN 20 MG TABLET: 20 | 90 days supply | Qty: 90 | Fill #0

## 2018-01-16 MED FILL — LEVOTHYROXINE 125 MCG TAB: 125 | 90 days supply | Qty: 90 | Fill #0

## 2018-01-18 DIAGNOSIS — Z1212 Encounter for screening for malignant neoplasm of rectum: Secondary | ICD-10-CM | POA: Diagnosis not present

## 2018-01-22 MED FILL — MELOXICAM 7.5 MG TABLET: 7.5 | 90 days supply | Qty: 90 | Fill #0

## 2018-02-07 MED FILL — BUPROPION HCL XL 150 MG TAB: 150 | 90 days supply | Qty: 90 | Fill #3

## 2018-02-19 MED FILL — TEMAZEPAM 30 MG CAPSULE: 30 | 90 days supply | Qty: 90 | Fill #0

## 2018-03-12 MED FILL — FLUoxetine HCL 40 MG CAPS: 40 | 90 days supply | Qty: 90 | Fill #0

## 2018-04-09 MED FILL — LEVOTHYROXINE 125 MCG TAB: 125 | 90 days supply | Qty: 90 | Fill #1

## 2018-04-09 MED FILL — ATORVASTATIN 20 MG TABLET: 20 | 90 days supply | Qty: 90 | Fill #1

## 2018-04-16 DIAGNOSIS — H5213 Myopia, bilateral: Secondary | ICD-10-CM | POA: Diagnosis not present

## 2018-04-16 DIAGNOSIS — H52203 Unspecified astigmatism, bilateral: Secondary | ICD-10-CM | POA: Diagnosis not present

## 2018-04-16 DIAGNOSIS — H524 Presbyopia: Secondary | ICD-10-CM | POA: Diagnosis not present

## 2018-05-07 MED FILL — buPROPion HCL ER (XL) 150 M: 150 | 90 days supply | Qty: 90 | Fill #0

## 2018-05-09 ENCOUNTER — Other Ambulatory Visit: Payer: Self-pay

## 2018-05-09 ENCOUNTER — Inpatient Hospital Stay: Payer: 59 | Attending: Family | Admitting: Family

## 2018-05-09 ENCOUNTER — Inpatient Hospital Stay: Payer: 59

## 2018-05-09 ENCOUNTER — Encounter: Payer: Self-pay | Admitting: Family

## 2018-05-09 VITALS — BP 126/62 | HR 54 | Temp 98.3°F | Resp 18 | Wt 239.0 lb

## 2018-05-09 DIAGNOSIS — D508 Other iron deficiency anemias: Secondary | ICD-10-CM | POA: Diagnosis not present

## 2018-05-09 DIAGNOSIS — Z9889 Other specified postprocedural states: Secondary | ICD-10-CM | POA: Diagnosis not present

## 2018-05-09 DIAGNOSIS — K912 Postsurgical malabsorption, not elsewhere classified: Secondary | ICD-10-CM | POA: Insufficient documentation

## 2018-05-09 DIAGNOSIS — Z79899 Other long term (current) drug therapy: Secondary | ICD-10-CM | POA: Diagnosis not present

## 2018-05-09 DIAGNOSIS — Z9884 Bariatric surgery status: Secondary | ICD-10-CM | POA: Diagnosis not present

## 2018-05-09 DIAGNOSIS — E559 Vitamin D deficiency, unspecified: Secondary | ICD-10-CM

## 2018-05-09 LAB — CBC WITH DIFFERENTIAL (CANCER CENTER ONLY)
BASOS ABS: 0 10*3/uL (ref 0.0–0.1)
BASOS PCT: 0 %
EOS ABS: 0.2 10*3/uL (ref 0.0–0.5)
Eosinophils Relative: 3 %
HCT: 43.9 % (ref 34.8–46.6)
HEMOGLOBIN: 14.4 g/dL (ref 11.6–15.9)
LYMPHS ABS: 3 10*3/uL (ref 0.9–3.3)
Lymphocytes Relative: 40 %
MCH: 31.6 pg (ref 26.0–34.0)
MCHC: 32.8 g/dL (ref 32.0–36.0)
MCV: 96.5 fL (ref 81.0–101.0)
Monocytes Absolute: 0.7 10*3/uL (ref 0.1–0.9)
Monocytes Relative: 10 %
NEUTROS PCT: 47 %
Neutro Abs: 3.7 10*3/uL (ref 1.5–6.5)
Platelet Count: 274 10*3/uL (ref 145–400)
RBC: 4.55 MIL/uL (ref 3.70–5.32)
RDW: 13 % (ref 11.1–15.7)
WBC: 7.7 10*3/uL (ref 3.9–10.0)

## 2018-05-09 NOTE — Progress Notes (Signed)
Hematology and Oncology Follow Up Visit  Brooke RichterColleen F French [id number] 1961/08/18 57 y.o. 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 Brooke French is here today for follow-up. She is doing well and has no complaints at this time.  She has changed to a healthier diet and is down 12 lbs since her last visit. She has maintained a good appetite and is eating healthy.  She has had no episodes of bleeding. She does bruise easily but not in excess. No petechiae.  No fever, chills, chewing ice, n/v, cough, rash, dizziness, SOB, chest pain, palpitations, abdominal pain or changes in bowel or bladder habits.  No swelling, tenderness, numbness or tingling in her extremities. No c/o pain.  No lymphadenopathy noted on exam.   ECOG Performance Status: 1 - Symptomatic but completely ambulatory  Medications:  Allergies as of 05/09/2018   No Known Allergies     Medication List        Accurate as of 05/09/18  3:39 PM. Always use your most recent med list.          Biotin 5000 MCG Caps Take by mouth daily.   buPROPion 150 MG 24 hr tablet Commonly known as:  WELLBUTRIN XL Take 150 mg by mouth daily.   ergocalciferol 50000 units capsule Commonly known as:  VITAMIN D2 Take 1 capsule (50,000 Units total) by mouth once a week.   ESTROVEN PO Take by mouth.   FLUoxetine 40 MG capsule Commonly known as:  PROZAC Take 40 mg by mouth daily.   levothyroxine 137 MCG tablet Commonly known as:  SYNTHROID, LEVOTHROID Take 137 mcg by mouth daily.   meloxicam 7.5 MG tablet Commonly known as:  MOBIC Take 7.5 mg by mouth daily.   multivitamin with minerals tablet Take 1 tablet by mouth daily.   naproxen sodium 220 MG tablet Commonly known as:  ALEVE Take 220 mg by mouth. 1-2 per day PRN   oxyCODONE-acetaminophen 7.5-325 MG tablet Commonly  known as:  PERCOCET Take 1-2 tabs po q4-6 hours prn pain   temazepam 30 MG capsule Commonly known as:  RESTORIL Take 30 mg by mouth at bedtime as needed for sleep.       Allergies: No Known Allergies  Past Medical History, Surgical history, Social history, and Family History were reviewed and updated.  Review of Systems: All other 10 point review of systems is negative.   Physical Exam:  vitals were not taken for this visit.   Wt Readings from Last 3 Encounters:  01/03/18 251 lb (113.9 kg)  09/05/17 251 lb 4 oz (114 kg)  06/01/17 252 lb (114.3 kg)    Ocular: Sclerae unicteric, pupils equal, round and reactive to light Ear-nose-throat: Oropharynx clear, dentition fair Lymphatic: No cervical, supraclavicular or axillary adenopathy Lungs no rales or rhonchi, good excursion bilaterally Heart regular rate and rhythm, no murmur appreciated Abd soft, nontender, positive bowel sounds, no liver or spleen tip palpated on exam, no fluid wave  MSK no focal spinal tenderness, no joint edema Neuro: non-focal, well-oriented, appropriate affect Breasts: Deferred   Lab Results  Component Value Date   WBC 7.7 05/09/2018   HGB 14.4 05/09/2018   HCT 43.9 05/09/2018   MCV 96.5 05/09/2018   PLT 274 05/09/2018   Lab Results  Component Value Date   FERRITIN 254 01/03/2018   IRON 47 01/03/2018  TIBC 308 01/03/2018   UIBC 260 01/03/2018   IRONPCTSAT 15 (L) 01/03/2018   Lab Results  Component Value Date   RETICCTPCT 1.9 01/03/2018   RBC 4.55 05/09/2018   No results found for: KPAFRELGTCHN, LAMBDASER, KAPLAMBRATIO No results found for: IGGSERUM, IGA, IGMSERUM No results found for: Brooke French, SPEI   Chemistry      Component Value Date/Time   NA 143 01/03/2018 1457   NA 151 (H) 09/05/2017 1445   K 4.2 01/03/2018 1457   K 4.3 09/05/2017 1445   CL 106 01/03/2018 1457   CL 104 09/05/2017 1445   CO2 29 01/03/2018 1457   CO2  31 09/05/2017 1445   BUN 17 01/03/2018 1457   BUN 19 09/05/2017 1445   CREATININE 0.50 (L) 01/03/2018 1457   CREATININE 0.9 09/05/2017 1445      Component Value Date/Time   CALCIUM 9.6 01/03/2018 1457   CALCIUM 9.6 09/05/2017 1445   ALKPHOS 126 (H) 01/03/2018 1457   ALKPHOS 108 (H) 09/05/2017 1445   AST 28 01/03/2018 1457   ALT 30 01/03/2018 1457   ALT 33 09/05/2017 1445   BILITOT 0.5 01/03/2018 1457      Impression and Plan: Ms. Brooke French is a very pleasant 57 yo caucasian female with iron deficiency anemia secondary to malabsorption after gastric bypass. She is doing well since receiving iron in April.  We will see what her iron studies show and bring her back in for infusion if needed.  We will plan to see her back in another 4 months for follow-up.  She will contact our office with any questions or concerns. We can certainly see her sooner if need be.   Emeline Gins, NP 7/25/20193:39 PM

## 2018-05-10 LAB — RETICULOCYTES
RBC.: 4.49 MIL/uL (ref 3.70–5.45)
RETIC CT PCT: 2 % (ref 0.7–2.1)
Retic Count, Absolute: 89.8 10*3/uL (ref 33.7–90.7)

## 2018-05-10 LAB — IRON AND TIBC
Iron: 52 ug/dL (ref 41–142)
SATURATION RATIOS: 16 % — AB (ref 21–57)
TIBC: 328 ug/dL (ref 236–444)
UIBC: 275 ug/dL

## 2018-05-10 LAB — FERRITIN: FERRITIN: 467 ng/mL — AB (ref 11–307)

## 2018-05-10 LAB — VITAMIN D 25 HYDROXY (VIT D DEFICIENCY, FRACTURES): Vit D, 25-Hydroxy: 27.5 ng/mL — ABNORMAL LOW (ref 30.0–100.0)

## 2018-05-15 DIAGNOSIS — Z6841 Body Mass Index (BMI) 40.0 and over, adult: Secondary | ICD-10-CM | POA: Diagnosis not present

## 2018-05-15 DIAGNOSIS — Z01419 Encounter for gynecological examination (general) (routine) without abnormal findings: Secondary | ICD-10-CM | POA: Diagnosis not present

## 2018-05-17 ENCOUNTER — Inpatient Hospital Stay: Payer: 59 | Attending: Hematology & Oncology

## 2018-05-17 VITALS — BP 101/58 | HR 57 | Temp 98.4°F | Resp 20

## 2018-05-17 DIAGNOSIS — K912 Postsurgical malabsorption, not elsewhere classified: Secondary | ICD-10-CM | POA: Insufficient documentation

## 2018-05-17 DIAGNOSIS — Z9884 Bariatric surgery status: Secondary | ICD-10-CM | POA: Diagnosis not present

## 2018-05-17 DIAGNOSIS — D508 Other iron deficiency anemias: Secondary | ICD-10-CM | POA: Insufficient documentation

## 2018-05-17 DIAGNOSIS — K909 Intestinal malabsorption, unspecified: Secondary | ICD-10-CM

## 2018-05-17 MED ORDER — SODIUM CHLORIDE 0.9 % IV SOLN
Freq: Once | INTRAVENOUS | Status: AC
  Administered 2018-05-17: 15:00:00 via INTRAVENOUS
  Filled 2018-05-17: qty 250

## 2018-05-17 MED ORDER — FERUMOXYTOL INJECTION 510 MG/17 ML
510.0000 mg | Freq: Once | INTRAVENOUS | Status: AC
  Administered 2018-05-17: 510 mg via INTRAVENOUS
  Filled 2018-05-17: qty 17

## 2018-05-17 NOTE — Patient Instructions (Signed)

## 2018-06-03 MED FILL — TEMAZEPAM 30 MG CAPSULE: 30 | 90 days supply | Qty: 90 | Fill #1

## 2018-06-18 MED FILL — FLUoxetine HCL 40 MG CAPS: 40 | 90 days supply | Qty: 90 | Fill #1

## 2018-07-02 DIAGNOSIS — H538 Other visual disturbances: Secondary | ICD-10-CM | POA: Diagnosis not present

## 2018-07-02 DIAGNOSIS — H5371 Glare sensitivity: Secondary | ICD-10-CM | POA: Diagnosis not present

## 2018-07-02 DIAGNOSIS — H25043 Posterior subcapsular polar age-related cataract, bilateral: Secondary | ICD-10-CM | POA: Diagnosis not present

## 2018-07-02 DIAGNOSIS — H2513 Age-related nuclear cataract, bilateral: Secondary | ICD-10-CM | POA: Diagnosis not present

## 2018-07-12 MED FILL — LEVOTHYROXINE 125 MCG TAB: 125 | 90 days supply | Qty: 90 | Fill #2

## 2018-07-12 MED FILL — MELOXICAM 7.5 MG TABLET: 7.5 | 90 days supply | Qty: 90 | Fill #1

## 2018-07-12 MED FILL — ATORVASTATIN CALCIUM 20 MG: 20 | 90 days supply | Qty: 90 | Fill #2

## 2018-07-17 DIAGNOSIS — H25043 Posterior subcapsular polar age-related cataract, bilateral: Secondary | ICD-10-CM | POA: Diagnosis not present

## 2018-07-17 DIAGNOSIS — H2513 Age-related nuclear cataract, bilateral: Secondary | ICD-10-CM | POA: Diagnosis not present

## 2018-07-17 MED FILL — DUREZOL 0.05% EYE DROPS: 0.05 | 25 days supply | Qty: 5 | Fill #0

## 2018-07-17 MED FILL — OFLOXACIN 0.3% EYE DROPS: 0.3 | 25 days supply | Qty: 5 | Fill #0

## 2018-07-31 DIAGNOSIS — H25042 Posterior subcapsular polar age-related cataract, left eye: Secondary | ICD-10-CM | POA: Diagnosis not present

## 2018-07-31 DIAGNOSIS — H25011 Cortical age-related cataract, right eye: Secondary | ICD-10-CM | POA: Diagnosis not present

## 2018-07-31 DIAGNOSIS — H2512 Age-related nuclear cataract, left eye: Secondary | ICD-10-CM | POA: Diagnosis not present

## 2018-07-31 DIAGNOSIS — H2511 Age-related nuclear cataract, right eye: Secondary | ICD-10-CM | POA: Diagnosis not present

## 2018-08-07 MED FILL — buPROPion HCL ER (XL) 150 M: 150 | 90 days supply | Qty: 90 | Fill #1

## 2018-08-14 DIAGNOSIS — H2511 Age-related nuclear cataract, right eye: Secondary | ICD-10-CM | POA: Diagnosis not present

## 2018-08-14 DIAGNOSIS — H25041 Posterior subcapsular polar age-related cataract, right eye: Secondary | ICD-10-CM | POA: Diagnosis not present

## 2018-09-05 ENCOUNTER — Inpatient Hospital Stay: Payer: 59 | Attending: Family | Admitting: Family

## 2018-09-05 ENCOUNTER — Inpatient Hospital Stay: Payer: 59

## 2018-09-05 VITALS — BP 110/59 | HR 62 | Temp 98.2°F | Resp 18 | Ht 64.0 in | Wt 222.8 lb

## 2018-09-05 DIAGNOSIS — Z79899 Other long term (current) drug therapy: Secondary | ICD-10-CM | POA: Insufficient documentation

## 2018-09-05 DIAGNOSIS — K912 Postsurgical malabsorption, not elsewhere classified: Secondary | ICD-10-CM | POA: Insufficient documentation

## 2018-09-05 DIAGNOSIS — R5383 Other fatigue: Secondary | ICD-10-CM | POA: Diagnosis not present

## 2018-09-05 DIAGNOSIS — Z791 Long term (current) use of non-steroidal anti-inflammatories (NSAID): Secondary | ICD-10-CM | POA: Insufficient documentation

## 2018-09-05 DIAGNOSIS — D508 Other iron deficiency anemias: Secondary | ICD-10-CM

## 2018-09-05 DIAGNOSIS — E559 Vitamin D deficiency, unspecified: Secondary | ICD-10-CM

## 2018-09-05 DIAGNOSIS — D509 Iron deficiency anemia, unspecified: Secondary | ICD-10-CM | POA: Diagnosis not present

## 2018-09-05 DIAGNOSIS — K909 Intestinal malabsorption, unspecified: Secondary | ICD-10-CM

## 2018-09-05 DIAGNOSIS — Z9884 Bariatric surgery status: Secondary | ICD-10-CM

## 2018-09-05 LAB — CBC WITH DIFFERENTIAL (CANCER CENTER ONLY)
Abs Immature Granulocytes: 0.02 10*3/uL (ref 0.00–0.07)
BASOS ABS: 0 10*3/uL (ref 0.0–0.1)
Basophils Relative: 0 %
EOS PCT: 3 %
Eosinophils Absolute: 0.3 10*3/uL (ref 0.0–0.5)
HCT: 43.4 % (ref 36.0–46.0)
HEMOGLOBIN: 13.8 g/dL (ref 12.0–15.0)
Immature Granulocytes: 0 %
LYMPHS ABS: 2.5 10*3/uL (ref 0.7–4.0)
LYMPHS PCT: 29 %
MCH: 31.5 pg (ref 26.0–34.0)
MCHC: 31.8 g/dL (ref 30.0–36.0)
MCV: 99.1 fL (ref 80.0–100.0)
MONO ABS: 0.7 10*3/uL (ref 0.1–1.0)
MONOS PCT: 8 %
Neutro Abs: 5.1 10*3/uL (ref 1.7–7.7)
Neutrophils Relative %: 60 %
Platelet Count: 281 10*3/uL (ref 150–400)
RBC: 4.38 MIL/uL (ref 3.87–5.11)
RDW: 13 % (ref 11.5–15.5)
WBC Count: 8.6 10*3/uL (ref 4.0–10.5)
nRBC: 0 % (ref 0.0–0.2)

## 2018-09-05 LAB — RETICULOCYTES
Immature Retic Fract: 8.8 % (ref 2.3–15.9)
RBC.: 4.38 MIL/uL (ref 3.87–5.11)
RETIC COUNT ABSOLUTE: 83.7 10*3/uL (ref 19.0–186.0)
Retic Ct Pct: 1.9 % (ref 0.4–3.1)

## 2018-09-05 NOTE — Progress Notes (Signed)
Hematology and Oncology Follow Up Visit  Brooke RichterColleen F French [id number] March 29, 1961 57 y.o. 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 Brooke French is here today for follow-up. She is doing well and has her "normal fatigue". She has had no episodes of bleeding, no bruising or petechiae.  She stays busy with her friends and family and helping with her breast cancer awareness organization. She recently had her head shaved as an incentive to donate.  She has had no issue with infections. No fever, chills, n/v, cough, rash, dizziness, SOB chest pain, palpitations, abdominal pain or changes in bowel or bladder habits.  No swelling, tenderness, numbness or tingling in her extremities.  No lymphadenopathy noted on exam.  She has maintained a good appetite and is staying well hydrated. Her weight is stable.   ECOG Performance Status: 0 - Asymptomatic  Medications:  Allergies as of 09/05/2018   No Known Allergies     Medication List        Accurate as of 09/05/18  4:04 PM. Always use your most recent med list.          atorvastatin 20 MG tablet Commonly known as:  LIPITOR Take 20 mg by mouth daily.   Biotin 5000 MCG Caps Take by mouth daily.   buPROPion 150 MG 24 hr tablet Commonly known as:  WELLBUTRIN XL Take 150 mg by mouth daily.   ergocalciferol 1.25 MG (50000 UT) capsule Commonly known as:  VITAMIN D2 Take 1 capsule (50,000 Units total) by mouth once a week.   ESTROVEN PO Take by mouth.   FLUoxetine 40 MG capsule Commonly known as:  PROZAC Take 40 mg by mouth daily.   levothyroxine 125 MCG tablet Commonly known as:  SYNTHROID, LEVOTHROID Take 137 mcg by mouth daily.   meloxicam 7.5 MG tablet Commonly known as:  MOBIC Take 7.5 mg by mouth daily.   multivitamin with minerals tablet Take 1 tablet by mouth daily.   naproxen sodium 220  MG tablet Commonly known as:  ALEVE Take 220 mg by mouth. 1-2 per day PRN   oxyCODONE-acetaminophen 7.5-325 MG tablet Commonly known as:  PERCOCET Take 1-2 tabs po q4-6 hours prn pain   temazepam 30 MG capsule Commonly known as:  RESTORIL Take 30 mg by mouth at bedtime as needed for sleep.       Allergies: No Known Allergies  Past Medical History, Surgical history, Social history, and Family History were reviewed and updated.  Review of Systems: All other 10 point review of systems is negative.   Physical Exam:  height is 5\' 4"  (1.626 m) and weight is 222 lb 12.8 oz (101.1 kg). Her oral temperature is 98.2 F (36.8 C). Her blood pressure is 110/59 (abnormal) and her pulse is 62. Her respiration is 18 and oxygen saturation is 98%.   Wt Readings from Last 3 Encounters:  09/05/18 222 lb 12.8 oz (101.1 kg)  05/09/18 239 lb (108.4 kg)  01/03/18 251 lb (113.9 kg)    Ocular: Sclerae unicteric, pupils equal, round and reactive to light Ear-nose-throat: Oropharynx clear, dentition fair Lymphatic: No cervical, supraclavicular or axillary adenopathy Lungs no rales or rhonchi, good excursion bilaterally Heart regular rate and rhythm, no murmur appreciated Abd soft, nontender, positive bowel sounds, no liver or spleen tip palpated on exam, no fluid wave  MSK no focal spinal tenderness, no joint edema Neuro:  non-focal, well-oriented, appropriate affect Breasts: Deferred   Lab Results  Component Value Date   WBC 8.6 09/05/2018   HGB 13.8 09/05/2018   HCT 43.4 09/05/2018   MCV 99.1 09/05/2018   PLT 281 09/05/2018   Lab Results  Component Value Date   FERRITIN 467 (H) 05/09/2018   IRON 52 05/09/2018   TIBC 328 05/09/2018   UIBC 275 05/09/2018   IRONPCTSAT 16 (L) 05/09/2018   Lab Results  Component Value Date   RETICCTPCT 1.9 09/05/2018   RBC 4.38 09/05/2018   RBC 4.38 09/05/2018   No results found for: KPAFRELGTCHN, LAMBDASER, KAPLAMBRATIO No results found for:  IGGSERUM, IGA, IGMSERUM No results found for: Marda Stalker, SPEI   Chemistry      Component Value Date/Time   NA 143 01/03/2018 1457   NA 151 (H) 09/05/2017 1445   K 4.2 01/03/2018 1457   K 4.3 09/05/2017 1445   CL 106 01/03/2018 1457   CL 104 09/05/2017 1445   CO2 29 01/03/2018 1457   CO2 31 09/05/2017 1445   BUN 17 01/03/2018 1457   BUN 19 09/05/2017 1445   CREATININE 0.50 (L) 01/03/2018 1457   CREATININE 0.9 09/05/2017 1445      Component Value Date/Time   CALCIUM 9.6 01/03/2018 1457   CALCIUM 9.6 09/05/2017 1445   ALKPHOS 126 (H) 01/03/2018 1457   ALKPHOS 108 (H) 09/05/2017 1445   AST 28 01/03/2018 1457   ALT 30 01/03/2018 1457   ALT 33 09/05/2017 1445   BILITOT 0.5 01/03/2018 1457       Impression and Plan: Ms. Brooke French is a very pleasant 57 yo caucasian female with iron deficiency anemia secondary to malabsorption after gastric bypass. She is doing well but has some mild fatigue at times.  We will see what her iron studies show and bring her back in for infusion if needed.  We will plant o see her in another 4 months.  She will contact our office with any questions or concerns. We can certainly see her sooner if need be.   Emeline Gins, NP 11/21/20194:04 PM

## 2018-09-06 LAB — IRON AND TIBC
IRON: 61 ug/dL (ref 41–142)
SATURATION RATIOS: 21 % (ref 21–57)
TIBC: 291 ug/dL (ref 236–444)
UIBC: 230 ug/dL (ref 120–384)

## 2018-09-06 LAB — FERRITIN: Ferritin: 657 ng/mL — ABNORMAL HIGH (ref 11–307)

## 2018-09-06 LAB — VITAMIN D 25 HYDROXY (VIT D DEFICIENCY, FRACTURES): Vit D, 25-Hydroxy: 27.9 ng/mL — ABNORMAL LOW (ref 30.0–100.0)

## 2018-09-09 MED FILL — TEMAZEPAM 30 MG CAPSULE: 30 | 90 days supply | Qty: 90 | Fill #0

## 2018-09-10 MED FILL — FLUoxetine HCL 40 MG CAPS: 40 | 90 days supply | Qty: 90 | Fill #2

## 2018-09-11 DIAGNOSIS — T8522XA Displacement of intraocular lens, initial encounter: Secondary | ICD-10-CM | POA: Diagnosis not present

## 2018-09-11 MED FILL — OFLOXACIN 0.3% EYE DROPS: 0.3 | 25 days supply | Qty: 5 | Fill #1

## 2018-10-10 MED FILL — LEVOTHYROXINE 125 MCG TAB: 125 | 90 days supply | Qty: 90 | Fill #0

## 2018-10-10 MED FILL — MELOXICAM 7.5 MG TABLET: 7.5 | 90 days supply | Qty: 90 | Fill #0

## 2018-10-11 MED FILL — AZITHROMYCIN 500 MG TABLET: 500 | 3 days supply | Qty: 3 | Fill #0

## 2018-10-18 MED FILL — buPROPion HCL ER (XL) 150 M: 150 | 90 days supply | Qty: 90 | Fill #0

## 2018-11-20 DIAGNOSIS — H26491 Other secondary cataract, right eye: Secondary | ICD-10-CM | POA: Diagnosis not present

## 2018-11-27 DIAGNOSIS — H26492 Other secondary cataract, left eye: Secondary | ICD-10-CM | POA: Diagnosis not present

## 2018-12-11 MED FILL — FLUoxetine HCL 40 MG CAPS: 40 | 90 days supply | Qty: 90 | Fill #3

## 2018-12-12 MED FILL — TEMAZEPAM 30 MG CAPSULE: 30 | 90 days supply | Qty: 90 | Fill #1

## 2018-12-16 ENCOUNTER — Other Ambulatory Visit: Payer: Self-pay | Admitting: Internal Medicine

## 2018-12-16 DIAGNOSIS — Z1231 Encounter for screening mammogram for malignant neoplasm of breast: Secondary | ICD-10-CM

## 2018-12-18 ENCOUNTER — Ambulatory Visit
Admission: RE | Admit: 2018-12-18 | Discharge: 2018-12-18 | Disposition: A | Discharge status: Home / Self Care | Payer: 59 | Source: Ambulatory Visit

## 2018-12-18 DIAGNOSIS — Z1231 Encounter for screening mammogram for malignant neoplasm of breast: Secondary | ICD-10-CM

## 2018-12-25 MED FILL — CEFDINIR 300 MG CAPSULE: 300 | 7 days supply | Qty: 14 | Fill #0

## 2018-12-30 ENCOUNTER — Telehealth: Payer: 59 | Admitting: Family

## 2018-12-30 DIAGNOSIS — R05 Cough: Secondary | ICD-10-CM

## 2018-12-30 DIAGNOSIS — R059 Cough, unspecified: Secondary | ICD-10-CM

## 2018-12-30 MED ORDER — AZITHROMYCIN 250 MG PO TABS: ORAL_TABLET | ORAL | 0 refills | Status: DC

## 2018-12-30 MED ORDER — PREDNISONE 10 MG (21) PO TBPK: ORAL_TABLET | ORAL | 0 refills | Status: DC

## 2018-12-30 MED ORDER — BENZONATATE 100 MG PO CAPS: 100.0000 mg | ORAL_CAPSULE | Freq: Three times a day (TID) | ORAL | 0 refills | Status: DC | PRN

## 2018-12-30 MED FILL — BENZONATATE 100 MG CAPS: 100 | 7 days supply | Qty: 20 | Fill #0

## 2018-12-30 MED FILL — AZITHROMYCIN 250 MG TABLET: 250 | 5 days supply | Qty: 6 | Fill #0

## 2018-12-30 MED FILL — predniSONE 10 MG TABS: 10 | 6 days supply | Qty: 21 | Fill #0

## 2018-12-30 NOTE — Progress Notes (Signed)
We are sorry that you are not feeling well.  Here is how we plan to help!  Based on your presentation I believe you most likely have A cough due to bacteria.  When patients have a fever and a productive cough with a change in color or increased sputum production, we are concerned about bacterial bronchitis.  If left untreated it can progress to pneumonia.  If your symptoms do not improve with your treatment plan it is important that you contact your provider.   I have prescribed Azithromyin 250 mg: two tablets now and then one tablet daily for 4 additonal days    In addition you may use A non-prescription cough medication called Robitussin DAC. Take 2 teaspoons every 8 hours or Delsym: take 2 teaspoons every 12 hours., A non-prescription cough medication called Mucinex DM: take 2 tablets every 12 hours. and A prescription cough medication called Tessalon Perles 100mg . You may take 1-2 capsules every 8 hours as needed for your cough.  Prednisone 10 mg daily for 6 days (see taper instructions below)   Approximately 5 minutes was spent documenting and reviewing patient's chart.    From your responses in the eVisit questionnaire you describe inflammation in the upper respiratory tract which is causing a significant cough.  This is commonly called Bronchitis and has four common causes:    Allergies  Viral Infections  Acid Reflux  Bacterial Infection Allergies, viruses and acid reflux are treated by controlling symptoms or eliminating the cause. An example might be a cough caused by taking certain blood pressure medications. You stop the cough by changing the medication. Another example might be a cough caused by acid reflux. Controlling the reflux helps control the cough.  USE OF BRONCHODILATOR ("RESCUE") INHALERS: There is a risk from using your bronchodilator too frequently.  The risk is that over-reliance on a medication which only relaxes the muscles surrounding the breathing tubes can reduce  the effectiveness of medications prescribed to reduce swelling and congestion of the tubes themselves.  Although you feel brief relief from the bronchodilator inhaler, your asthma may actually be worsening with the tubes becoming more swollen and filled with mucus.  This can delay other crucial treatments, such as oral steroid medications. If you need to use a bronchodilator inhaler daily, several times per day, you should discuss this with your provider.  There are probably better treatments that could be used to keep your asthma under control.     HOME CARE . Only take medications as instructed by your medical team. . Complete the entire course of an antibiotic. . Drink plenty of fluids and get plenty of rest. . Avoid close contacts especially the very young and the elderly . Cover your mouth if you cough or cough into your sleeve. . Always remember to wash your hands . A steam or ultrasonic humidifier can help congestion.   GET HELP RIGHT AWAY IF: . You develop worsening fever. . You become short of breath . You cough up blood. . Your symptoms persist after you have completed your treatment plan MAKE SURE YOU   Understand these instructions.  Will watch your condition.  Will get help right away if you are not doing well or get worse.  Your e-visit answers were reviewed by a board certified advanced clinical practitioner to complete your personal care plan.  Depending on the condition, your plan could have included both over the counter or prescription medications. If there is a problem please reply  once you  have received a response from your provider. Your safety is important to [place].  If you have drug allergies check your prescription carefully.    You can use MyChart to ask questions about today's visit, request a non-urgent call back, or ask for a work or school excuse for 24 hours related to this e-Visit. If it has been greater than 24 hours you will need to follow up with your  provider, or enter a new e-Visit to address those concerns. You will get an e-mail in the next two days asking about your experience.  I hope that your e-visit has been valuable and will speed your recovery. Thank you for using e-visits.

## 2019-01-01 ENCOUNTER — Telehealth: Payer: Self-pay | Admitting: Family

## 2019-01-01 MED FILL — LEVOTHYROXINE 125 MCG TAB: 125 | 90 days supply | Qty: 90 | Fill #1

## 2019-01-01 MED FILL — ATORVASTATIN 20 MG TABLET: 20 | 90 days supply | Qty: 90 | Fill #1

## 2019-01-01 MED FILL — buPROPion HCL ER (XL) 150 M: 150 | 90 days supply | Qty: 90 | Fill #1

## 2019-01-01 MED FILL — MELOXICAM 7.5 MG TABLET: 7.5 | 90 days supply | Qty: 90 | Fill #1

## 2019-01-01 NOTE — Telephone Encounter (Signed)
Spoke with patient and screened/ No symptoms/ r/s from 3/19 per patient request

## 2019-01-02 ENCOUNTER — Other Ambulatory Visit: Payer: 59

## 2019-01-02 ENCOUNTER — Ambulatory Visit: Payer: 59 | Admitting: Family

## 2019-01-15 DIAGNOSIS — E038 Other specified hypothyroidism: Secondary | ICD-10-CM | POA: Diagnosis not present

## 2019-01-15 DIAGNOSIS — Z0000 Encounter for general adult medical examination without abnormal findings: Secondary | ICD-10-CM | POA: Diagnosis not present

## 2019-01-15 DIAGNOSIS — R82998 Other abnormal findings in urine: Secondary | ICD-10-CM | POA: Diagnosis not present

## 2019-01-22 DIAGNOSIS — Z1339 Encounter for screening examination for other mental health and behavioral disorders: Secondary | ICD-10-CM | POA: Diagnosis not present

## 2019-01-22 DIAGNOSIS — Z1211 Encounter for screening for malignant neoplasm of colon: Secondary | ICD-10-CM | POA: Diagnosis not present

## 2019-01-22 DIAGNOSIS — Z1331 Encounter for screening for depression: Secondary | ICD-10-CM | POA: Diagnosis not present

## 2019-01-22 DIAGNOSIS — Z0000 Encounter for general adult medical examination without abnormal findings: Secondary | ICD-10-CM | POA: Diagnosis not present

## 2019-01-22 DIAGNOSIS — E669 Obesity, unspecified: Secondary | ICD-10-CM | POA: Diagnosis not present

## 2019-01-22 DIAGNOSIS — E785 Hyperlipidemia, unspecified: Secondary | ICD-10-CM | POA: Diagnosis not present

## 2019-01-22 DIAGNOSIS — D638 Anemia in other chronic diseases classified elsewhere: Secondary | ICD-10-CM | POA: Diagnosis not present

## 2019-01-22 DIAGNOSIS — F329 Major depressive disorder, single episode, unspecified: Secondary | ICD-10-CM | POA: Diagnosis not present

## 2019-01-22 DIAGNOSIS — E039 Hypothyroidism, unspecified: Secondary | ICD-10-CM | POA: Diagnosis not present

## 2019-03-11 MED FILL — FLUoxetine HCL 40 MG CAPS: 40 | 90 days supply | Qty: 90 | Fill #0

## 2019-03-13 ENCOUNTER — Inpatient Hospital Stay (HOSPITAL_BASED_OUTPATIENT_CLINIC_OR_DEPARTMENT_OTHER): Payer: 59 | Admitting: Family

## 2019-03-13 ENCOUNTER — Inpatient Hospital Stay: Payer: 59 | Attending: Family

## 2019-03-13 ENCOUNTER — Other Ambulatory Visit: Payer: Self-pay

## 2019-03-13 VITALS — BP 108/54 | HR 63 | Resp 18 | Wt 223.8 lb

## 2019-03-13 DIAGNOSIS — E559 Vitamin D deficiency, unspecified: Secondary | ICD-10-CM

## 2019-03-13 DIAGNOSIS — D509 Iron deficiency anemia, unspecified: Secondary | ICD-10-CM | POA: Insufficient documentation

## 2019-03-13 DIAGNOSIS — D508 Other iron deficiency anemias: Secondary | ICD-10-CM

## 2019-03-13 DIAGNOSIS — K909 Intestinal malabsorption, unspecified: Secondary | ICD-10-CM | POA: Diagnosis not present

## 2019-03-13 DIAGNOSIS — K912 Postsurgical malabsorption, not elsewhere classified: Secondary | ICD-10-CM | POA: Insufficient documentation

## 2019-03-13 LAB — CBC WITH DIFFERENTIAL (CANCER CENTER ONLY)
Abs Immature Granulocytes: 0.01 10*3/uL (ref 0.00–0.07)
Basophils Absolute: 0 10*3/uL (ref 0.0–0.1)
Basophils Relative: 0 %
Eosinophils Absolute: 0.2 10*3/uL (ref 0.0–0.5)
Eosinophils Relative: 3 %
HCT: 42.5 % (ref 36.0–46.0)
Hemoglobin: 14 g/dL (ref 12.0–15.0)
Immature Granulocytes: 0 %
Lymphocytes Relative: 39 %
Lymphs Abs: 2.6 10*3/uL (ref 0.7–4.0)
MCH: 31.9 pg (ref 26.0–34.0)
MCHC: 32.9 g/dL (ref 30.0–36.0)
MCV: 96.8 fL (ref 80.0–100.0)
Monocytes Absolute: 0.5 10*3/uL (ref 0.1–1.0)
Monocytes Relative: 8 %
Neutro Abs: 3.4 10*3/uL (ref 1.7–7.7)
Neutrophils Relative %: 50 %
Platelet Count: 261 10*3/uL (ref 150–400)
RBC: 4.39 MIL/uL (ref 3.87–5.11)
RDW: 13.3 % (ref 11.5–15.5)
WBC Count: 6.8 10*3/uL (ref 4.0–10.5)
nRBC: 0 % (ref 0.0–0.2)

## 2019-03-13 LAB — RETICULOCYTES
Immature Retic Fract: 7.7 % (ref 2.3–15.9)
RBC.: 4.31 MIL/uL (ref 3.87–5.11)
Retic Count, Absolute: 77.1 10*3/uL (ref 19.0–186.0)
Retic Ct Pct: 1.8 % (ref 0.4–3.1)

## 2019-03-13 NOTE — Progress Notes (Signed)
Hematology and Oncology Follow Up Visit  Brooke French [id number] 01/02/1961 58 y.o. 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 She is doing well but getting tired of being in quarantine.  No issue with infections. No fever, chills, n/v, cough, rash, dizziness, SOB, chest pain, palpitations, abdominal pain or changes in bowel or bladder habits.  No episodes of bleeding, no bruising or petechiae.  No swelling, tenderness, numbness or tingling in her extremities at this time.  No lymphadenopathy noted on exam.  She has maintained a good appetite and is staying well hydrated. Her weight is stable.   ECOG Performance Status: 1 - Symptomatic but completely ambulatory  Medications:  Allergies as of 03/13/2019   No Known Allergies     Medication List       Accurate as of Mar 13, 2019  3:09 PM. If you have any questions, ask your nurse or doctor.        atorvastatin 20 MG tablet Commonly known as:  LIPITOR Take 20 mg by mouth daily.   azithromycin 250 MG tablet Commonly known as:  ZITHROMAX Take 500 mg once, then 250 mg for four days   benzonatate 100 MG capsule Commonly known as:  Tessalon Perles Take 1 capsule (100 mg total) by mouth 3 (three) times daily as needed.   Biotin 5000 MCG Caps Take by mouth daily.   buPROPion 150 MG 24 hr tablet Commonly known as:  WELLBUTRIN XL Take 150 mg by mouth daily.   ergocalciferol 1.25 MG (50000 UT) capsule Commonly known as:  VITAMIN D2 Take 1 capsule (50,000 Units total) by mouth once a week.   ESTROVEN PO Take by mouth.   FLUoxetine 40 MG capsule Commonly known as:  PROZAC Take 40 mg by mouth daily.   levothyroxine 125 MCG tablet Commonly known as:  SYNTHROID Take 137 mcg by mouth daily.   meloxicam 7.5 MG tablet Commonly known as:  MOBIC Take  7.5 mg by mouth daily.   multivitamin with minerals tablet Take 1 tablet by mouth daily.   naproxen sodium 220 MG tablet Commonly known as:  ALEVE Take 220 mg by mouth. 1-2 per day PRN   oxyCODONE-acetaminophen 7.5-325 MG tablet Commonly known as:  Percocet Take 1-2 tabs po q4-6 hours prn pain   predniSONE 10 MG (21) Tbpk tablet Commonly known as:  STERAPRED UNI-PAK 21 TAB Use as directed   temazepam 30 MG capsule Commonly known as:  RESTORIL Take 30 mg by mouth at bedtime as needed for sleep.       Allergies: No Known Allergies  Past Medical History, Surgical history, Social history, and Family History were reviewed and updated.  Review of Systems: All other 10 point review of systems is negative.   Physical Exam:  vitals were not taken for this visit.   Wt Readings from Last 3 Encounters:  09/05/18 222 lb 12.8 oz (101.1 kg)  05/09/18 239 lb (108.4 kg)  01/03/18 251 lb (113.9 kg)    Ocular: Sclerae unicteric, pupils equal, round and reactive to light Ear-nose-throat: Oropharynx clear, dentition fair Lymphatic: No cervical or supraclavicular adenopathy Lungs no rales or rhonchi, good excursion bilaterally Heart regular rate and rhythm, no murmur appreciated Abd soft, nontender, positive bowel sounds, no liver or spleen tip palpated on exam, no fluid wave  MSK no focal  spinal tenderness, no joint edema Neuro: non-focal, well-oriented, appropriate affect Breasts: Deferred   Lab Results  Component Value Date   WBC 6.8 03/13/2019   HGB 14.0 03/13/2019   HCT 42.5 03/13/2019   MCV 96.8 03/13/2019   PLT 261 03/13/2019   Lab Results  Component Value Date   FERRITIN 657 (H) 09/05/2018   IRON 61 09/05/2018   TIBC 291 09/05/2018   UIBC 230 09/05/2018   IRONPCTSAT 21 09/05/2018   Lab Results  Component Value Date   RETICCTPCT 1.8 03/13/2019   RBC 4.31 03/13/2019   No results found for: KPAFRELGTCHN, LAMBDASER, KAPLAMBRATIO No results found for: IGGSERUM,  IGA, IGMSERUM No results found for: Marda StalkerOTALPROTELP, ALBUMINELP, A1GS, A2GS, BETS, BETA2SER, GAMS, MSPIKE, SPEI   Chemistry      Component Value Date/Time   NA 143 01/03/2018 1457   NA 151 (H) 09/05/2017 1445   K 4.2 01/03/2018 1457   K 4.3 09/05/2017 1445   CL 106 01/03/2018 1457   CL 104 09/05/2017 1445   CO2 29 01/03/2018 1457   CO2 31 09/05/2017 1445   BUN 17 01/03/2018 1457   BUN 19 09/05/2017 1445   CREATININE 0.50 (L) 01/03/2018 1457   CREATININE 0.9 09/05/2017 1445      Component Value Date/Time   CALCIUM 9.6 01/03/2018 1457   CALCIUM 9.6 09/05/2017 1445   ALKPHOS 126 (H) 01/03/2018 1457   ALKPHOS 108 (H) 09/05/2017 1445   AST 28 01/03/2018 1457   ALT 30 01/03/2018 1457   ALT 33 09/05/2017 1445   BILITOT 0.5 01/03/2018 1457       Impression and Plan: Brooke French is a very pleasant 58 yo caucasian female with iron deficiency anemia secondary to malabsorption after gastric bypass.  She is doing well and has no complaints at this time.  We will see what her iron studies show and bring her back in for infusion if needed.  We will plan to see her back in another 4 months.  She will contact our office with any questions or concerns. We can certainly see her sooner if need be.   Emeline GinsSarah [place], NP 5/28/20203:09 PM

## 2019-03-14 ENCOUNTER — Telehealth: Payer: Self-pay | Admitting: Family

## 2019-03-14 LAB — IRON AND TIBC
Iron: 79 ug/dL (ref 41–142)
Saturation Ratios: 24 % (ref 21–57)
TIBC: 324 ug/dL (ref 236–444)
UIBC: 245 ug/dL (ref 120–384)

## 2019-03-14 LAB — FERRITIN: Ferritin: 394 ng/mL — ABNORMAL HIGH (ref 11–307)

## 2019-03-14 LAB — VITAMIN D 25 HYDROXY (VIT D DEFICIENCY, FRACTURES): Vit D, 25-Hydroxy: 27.4 ng/mL — ABNORMAL LOW (ref 30.0–100.0)

## 2019-03-14 NOTE — Telephone Encounter (Signed)
Appointments scheduled letter/calendar mailed per 5/29 los

## 2019-03-31 MED FILL — TEMAZEPAM 30 MG CAPSULE: 30 | 90 days supply | Qty: 90 | Fill #0

## 2019-04-07 MED FILL — LEVOTHYROXINE 125 MCG TAB: 125 | 90 days supply | Qty: 90 | Fill #0

## 2019-04-07 MED FILL — ATORVASTATIN 20 MG TABLET: 20 | 90 days supply | Qty: 90 | Fill #0

## 2019-04-07 MED FILL — MELOXICAM 7.5 MG TABLET: 7.5 | 90 days supply | Qty: 90 | Fill #0

## 2019-04-28 MED FILL — buPROPion HCL ER (XL) 150 M: 150 | 90 days supply | Qty: 90 | Fill #0

## 2019-05-07 DIAGNOSIS — H43811 Vitreous degeneration, right eye: Secondary | ICD-10-CM | POA: Diagnosis not present

## 2019-05-07 DIAGNOSIS — H43311 Vitreous membranes and strands, right eye: Secondary | ICD-10-CM | POA: Diagnosis not present

## 2019-05-07 DIAGNOSIS — H43312 Vitreous membranes and strands, left eye: Secondary | ICD-10-CM | POA: Diagnosis not present

## 2019-05-07 DIAGNOSIS — H43812 Vitreous degeneration, left eye: Secondary | ICD-10-CM | POA: Diagnosis not present

## 2019-05-19 MED FILL — TRIAMCINOLONE 0.1% CREAM: 0.1 | 30 days supply | Qty: 60 | Fill #0

## 2019-05-19 MED FILL — predniSONE 5 MG TABS: 5 | 6 days supply | Qty: 21 | Fill #0

## 2019-05-21 MED FILL — PREDNISOLONE AC 1% EYE DROP: 1 | 25 days supply | Qty: 5 | Fill #0

## 2019-05-21 MED FILL — OFLOXACIN 0.3% EYE DROPS: 0.3 | 25 days supply | Qty: 5 | Fill #0

## 2019-05-28 DIAGNOSIS — H43312 Vitreous membranes and strands, left eye: Secondary | ICD-10-CM | POA: Diagnosis not present

## 2019-06-09 DIAGNOSIS — Z6841 Body Mass Index (BMI) 40.0 and over, adult: Secondary | ICD-10-CM | POA: Diagnosis not present

## 2019-06-09 DIAGNOSIS — Z01419 Encounter for gynecological examination (general) (routine) without abnormal findings: Secondary | ICD-10-CM | POA: Diagnosis not present

## 2019-06-09 MED FILL — FLUoxetine HCL 40 MG CAPS: 40 | 90 days supply | Qty: 90 | Fill #1

## 2019-06-10 DIAGNOSIS — H43312 Vitreous membranes and strands, left eye: Secondary | ICD-10-CM | POA: Diagnosis not present

## 2019-06-10 DIAGNOSIS — Z09 Encounter for follow-up examination after completed treatment for conditions other than malignant neoplasm: Secondary | ICD-10-CM | POA: Diagnosis not present

## 2019-06-10 DIAGNOSIS — H43311 Vitreous membranes and strands, right eye: Secondary | ICD-10-CM | POA: Diagnosis not present

## 2019-06-27 MED FILL — TEMAZEPAM 30 MG CAPSULE: 30 | 90 days supply | Qty: 90 | Fill #1

## 2019-07-02 DIAGNOSIS — H43811 Vitreous degeneration, right eye: Secondary | ICD-10-CM | POA: Diagnosis not present

## 2019-07-02 DIAGNOSIS — H43311 Vitreous membranes and strands, right eye: Secondary | ICD-10-CM | POA: Diagnosis not present

## 2019-07-02 MED FILL — PREDNISOLONE AC 1% EYE DROP: 1 | 25 days supply | Qty: 5 | Fill #0

## 2019-07-02 MED FILL — OFLOXACIN 0.3% EYE DROPS: 0.3 | 25 days supply | Qty: 5 | Fill #0

## 2019-07-04 MED FILL — LEVOTHYROXINE 125 MCG TAB: 125 | 90 days supply | Qty: 90 | Fill #1

## 2019-07-04 MED FILL — MELOXICAM 7.5 MG TABLET: 7.5 | 90 days supply | Qty: 90 | Fill #1

## 2019-07-04 MED FILL — ATORVASTATIN 20 MG TABLET: 20 | 90 days supply | Qty: 90 | Fill #1

## 2019-07-09 DIAGNOSIS — H43311 Vitreous membranes and strands, right eye: Secondary | ICD-10-CM | POA: Diagnosis not present

## 2019-07-14 ENCOUNTER — Encounter: Payer: Self-pay | Admitting: Family

## 2019-07-14 ENCOUNTER — Inpatient Hospital Stay (HOSPITAL_BASED_OUTPATIENT_CLINIC_OR_DEPARTMENT_OTHER): Payer: 59 | Admitting: Family

## 2019-07-14 ENCOUNTER — Other Ambulatory Visit: Payer: Self-pay

## 2019-07-14 ENCOUNTER — Inpatient Hospital Stay: Payer: 59 | Attending: Hematology & Oncology

## 2019-07-14 VITALS — BP 136/86 | HR 66 | Temp 98.0°F | Resp 18 | Wt 238.0 lb

## 2019-07-14 DIAGNOSIS — E559 Vitamin D deficiency, unspecified: Secondary | ICD-10-CM

## 2019-07-14 DIAGNOSIS — Z79899 Other long term (current) drug therapy: Secondary | ICD-10-CM | POA: Insufficient documentation

## 2019-07-14 DIAGNOSIS — R5383 Other fatigue: Secondary | ICD-10-CM | POA: Insufficient documentation

## 2019-07-14 DIAGNOSIS — K912 Postsurgical malabsorption, not elsewhere classified: Secondary | ICD-10-CM | POA: Insufficient documentation

## 2019-07-14 DIAGNOSIS — D508 Other iron deficiency anemias: Secondary | ICD-10-CM | POA: Diagnosis not present

## 2019-07-14 DIAGNOSIS — D509 Iron deficiency anemia, unspecified: Secondary | ICD-10-CM | POA: Diagnosis not present

## 2019-07-14 DIAGNOSIS — K909 Intestinal malabsorption, unspecified: Secondary | ICD-10-CM

## 2019-07-14 LAB — CBC WITH DIFFERENTIAL (CANCER CENTER ONLY)
Abs Immature Granulocytes: 0.02 10*3/uL (ref 0.00–0.07)
Basophils Absolute: 0 10*3/uL (ref 0.0–0.1)
Basophils Relative: 0 %
Eosinophils Absolute: 0.1 10*3/uL (ref 0.0–0.5)
Eosinophils Relative: 1 %
HCT: 43.5 % (ref 36.0–46.0)
Hemoglobin: 14.3 g/dL (ref 12.0–15.0)
Immature Granulocytes: 0 %
Lymphocytes Relative: 33 %
Lymphs Abs: 2.5 10*3/uL (ref 0.7–4.0)
MCH: 32.3 pg (ref 26.0–34.0)
MCHC: 32.9 g/dL (ref 30.0–36.0)
MCV: 98.2 fL (ref 80.0–100.0)
Monocytes Absolute: 0.6 10*3/uL (ref 0.1–1.0)
Monocytes Relative: 8 %
Neutro Abs: 4.4 10*3/uL (ref 1.7–7.7)
Neutrophils Relative %: 58 %
Platelet Count: 261 10*3/uL (ref 150–400)
RBC: 4.43 MIL/uL (ref 3.87–5.11)
RDW: 13.2 % (ref 11.5–15.5)
WBC Count: 7.6 10*3/uL (ref 4.0–10.5)
nRBC: 0 % (ref 0.0–0.2)

## 2019-07-14 NOTE — Progress Notes (Signed)
Hematology and Oncology Follow Up Visit  Brooke French [id number] 09/04/1961 58 y.o. 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 She is doing quite well and has no complaints at this time.  She has had no issues with bleeding. No bruising or petechiae.  She states that she has "normal" fatigue after a long day at work.  No fever, chills, n/v, cough, rash, dizziness, SOB, chest pain, palpitations, abdominal pain or changes in bowel or bladder habits.  No swelling, tenderness, numbness or tingling in her extremities.  She has maintained a good appetite and is staying well hydrated. Her weight is stable.   ECOG Performance Status: 1 - Symptomatic but completely ambulatory  Medications:  Allergies as of 07/14/2019   No Known Allergies     Medication List       Accurate as of July 14, 2019  2:58 PM. If you have any questions, ask your nurse or doctor.        atorvastatin 20 MG tablet Commonly known as: LIPITOR Take 20 mg by mouth daily.   azithromycin 250 MG tablet Commonly known as: ZITHROMAX Take 500 mg once, then 250 mg for four days   benzonatate 100 MG capsule Commonly known as: Tessalon Perles Take 1 capsule (100 mg total) by mouth 3 (three) times daily as needed.   Biotin 5000 MCG Caps Take by mouth daily.   buPROPion 150 MG 24 hr tablet Commonly known as: WELLBUTRIN XL Take 150 mg by mouth daily.   ergocalciferol 1.25 MG (50000 UT) capsule Commonly known as: VITAMIN D2 Take 1 capsule (50,000 Units total) by mouth once a week.   ESTROVEN PO Take by mouth.   FLUoxetine 40 MG capsule Commonly known as: PROZAC Take 40 mg by mouth daily.   levothyroxine 125 MCG tablet Commonly known as: SYNTHROID Take 137 mcg by mouth daily.   meloxicam 7.5 MG tablet Commonly known as: MOBIC  Take 7.5 mg by mouth daily.   multivitamin with minerals tablet Take 1 tablet by mouth daily.   naproxen sodium 220 MG tablet Commonly known as: ALEVE Take 220 mg by mouth. 1-2 per day PRN   oxyCODONE-acetaminophen 7.5-325 MG tablet Commonly known as: Percocet Take 1-2 tabs po q4-6 hours prn pain   predniSONE 10 MG (21) Tbpk tablet Commonly known as: STERAPRED UNI-PAK 21 TAB Use as directed   temazepam 30 MG capsule Commonly known as: RESTORIL Take 30 mg by mouth at bedtime as needed for sleep.       Allergies: No Known Allergies  Past Medical History, Surgical history, Social history, and Family History were reviewed and updated.  Review of Systems: All other 10 point review of systems is negative.   Physical Exam:  vitals were not taken for this visit.   Wt Readings from Last 3 Encounters:  03/13/19 223 lb 12.8 oz (101.5 kg)  09/05/18 222 lb 12.8 oz (101.1 kg)  05/09/18 239 lb (108.4 kg)    Ocular: Sclerae unicteric, pupils equal, round and reactive to light Ear-nose-throat: Oropharynx clear, dentition fair Lymphatic: No cervical or supraclavicular adenopathy Lungs no rales or rhonchi, good excursion bilaterally Heart regular rate and rhythm, no murmur appreciated Abd soft, nontender, positive bowel sounds, no liver or spleen tip palpated on exam, no fluid wave  MSK no focal spinal tenderness, no joint edema  Neuro: non-focal, well-oriented, appropriate affect Breasts: Deferred   Lab Results  Component Value Date   WBC 7.6 07/14/2019   HGB 14.3 07/14/2019   HCT 43.5 07/14/2019   MCV 98.2 07/14/2019   PLT 261 07/14/2019   Lab Results  Component Value Date   FERRITIN 394 (H) 03/13/2019   IRON 79 03/13/2019   TIBC 324 03/13/2019   UIBC 245 03/13/2019   IRONPCTSAT 24 03/13/2019   Lab Results  Component Value Date   RETICCTPCT 1.8 03/13/2019   RBC 4.43 07/14/2019   No results found for: KPAFRELGTCHN, LAMBDASER, KAPLAMBRATIO No results found for:  IGGSERUM, IGA, IGMSERUM No results found for: Marda Stalker, SPEI   Chemistry      Component Value Date/Time   NA 143 01/03/2018 1457   NA 151 (H) 09/05/2017 1445   K 4.2 01/03/2018 1457   K 4.3 09/05/2017 1445   CL 106 01/03/2018 1457   CL 104 09/05/2017 1445   CO2 29 01/03/2018 1457   CO2 31 09/05/2017 1445   BUN 17 01/03/2018 1457   BUN 19 09/05/2017 1445   CREATININE 0.50 (L) 01/03/2018 1457   CREATININE 0.9 09/05/2017 1445      Component Value Date/Time   CALCIUM 9.6 01/03/2018 1457   CALCIUM 9.6 09/05/2017 1445   ALKPHOS 126 (H) 01/03/2018 1457   ALKPHOS 108 (H) 09/05/2017 1445   AST 28 01/03/2018 1457   ALT 30 01/03/2018 1457   ALT 33 09/05/2017 1445   BILITOT 0.5 01/03/2018 1457       Impression and Plan: Brooke French is a very pleasant 58 yo caucasian female with iron deficiency anemia secondary to malabsorption after gastric bypass.  We will see what her iron studies show and bring her back in for infusion if needed.  We will plan to see her back in another 1 year per her request.  She will contact our office with any questions or concerns. We can certainly see her sooner if needed.   Emeline Gins, NP 9/28/20202:58 PM

## 2019-07-15 ENCOUNTER — Telehealth: Payer: Self-pay | Admitting: Family

## 2019-07-15 LAB — IRON AND TIBC
Iron: 89 ug/dL (ref 41–142)
Saturation Ratios: 25 % (ref 21–57)
TIBC: 361 ug/dL (ref 236–444)
UIBC: 272 ug/dL (ref 120–384)

## 2019-07-15 LAB — VITAMIN D 25 HYDROXY (VIT D DEFICIENCY, FRACTURES): Vit D, 25-Hydroxy: 20.9 ng/mL — ABNORMAL LOW (ref 30.0–100.0)

## 2019-07-15 LAB — FERRITIN: Ferritin: 388 ng/mL — ABNORMAL HIGH (ref 11–307)

## 2019-07-15 NOTE — Telephone Encounter (Signed)
Called and advised patient of appointments added per 9/28 los

## 2019-07-30 DIAGNOSIS — F329 Major depressive disorder, single episode, unspecified: Secondary | ICD-10-CM | POA: Diagnosis not present

## 2019-07-30 DIAGNOSIS — D638 Anemia in other chronic diseases classified elsewhere: Secondary | ICD-10-CM | POA: Diagnosis not present

## 2019-07-30 DIAGNOSIS — E785 Hyperlipidemia, unspecified: Secondary | ICD-10-CM | POA: Diagnosis not present

## 2019-07-30 DIAGNOSIS — E039 Hypothyroidism, unspecified: Secondary | ICD-10-CM | POA: Diagnosis not present

## 2019-07-31 MED FILL — buPROPion HCL ER (XL) 150 M: 150 | 90 days supply | Qty: 90 | Fill #1

## 2019-09-30 MED FILL — TEMAZEPAM 30 MG CAPSULE: 30 | 90 days supply | Qty: 90 | Fill #0

## 2019-09-30 MED FILL — ATORVASTATIN 20 MG TABLET: 20 | 90 days supply | Qty: 90 | Fill #0

## 2019-09-30 MED FILL — LEVOTHYROXINE 125 MCG TAB: 125 | 90 days supply | Qty: 90 | Fill #0

## 2019-10-15 MED FILL — MELOXICAM 7.5 MG TABLET: 7.5 | 90 days supply | Qty: 90 | Fill #0

## 2019-10-28 DIAGNOSIS — H43811 Vitreous degeneration, right eye: Secondary | ICD-10-CM | POA: Diagnosis not present

## 2019-10-28 DIAGNOSIS — H43311 Vitreous membranes and strands, right eye: Secondary | ICD-10-CM | POA: Diagnosis not present

## 2019-10-28 DIAGNOSIS — H43312 Vitreous membranes and strands, left eye: Secondary | ICD-10-CM | POA: Diagnosis not present

## 2019-10-28 DIAGNOSIS — H43391 Other vitreous opacities, right eye: Secondary | ICD-10-CM | POA: Diagnosis not present

## 2019-11-05 MED FILL — buPROPion HCL ER (XL) 150 M: 150 | 90 days supply | Qty: 90 | Fill #0

## 2019-11-26 ENCOUNTER — Other Ambulatory Visit: Payer: Self-pay | Admitting: Internal Medicine

## 2019-11-26 DIAGNOSIS — Z1231 Encounter for screening mammogram for malignant neoplasm of breast: Secondary | ICD-10-CM

## 2019-12-08 MED FILL — FLUoxetine HCL 40 MG CAPS: 40 | 90 days supply | Qty: 90 | Fill #3

## 2019-12-29 ENCOUNTER — Ambulatory Visit: Payer: 59

## 2020-01-05 ENCOUNTER — Other Ambulatory Visit: Payer: Self-pay

## 2020-01-05 ENCOUNTER — Ambulatory Visit
Admission: RE | Admit: 2020-01-05 | Discharge: 2020-01-05 | Disposition: A | Discharge status: Home / Self Care | Payer: 59 | Source: Ambulatory Visit | Attending: Internal Medicine | Admitting: Internal Medicine

## 2020-01-05 DIAGNOSIS — Z1231 Encounter for screening mammogram for malignant neoplasm of breast: Secondary | ICD-10-CM

## 2020-01-05 MED FILL — ATORVASTATIN 20 MG TABLET: 20 | 90 days supply | Qty: 90 | Fill #1

## 2020-01-05 MED FILL — LEVOTHYROXINE SODIUM 125 MC: 125 | 90 days supply | Qty: 90 | Fill #1

## 2020-01-05 MED FILL — TEMAZEPAM 30 MG CAPSULE: 30 | 90 days supply | Qty: 90 | Fill #1

## 2020-01-21 DIAGNOSIS — E7849 Other hyperlipidemia: Secondary | ICD-10-CM | POA: Diagnosis not present

## 2020-01-21 DIAGNOSIS — Z0000 Encounter for general adult medical examination without abnormal findings: Secondary | ICD-10-CM | POA: Diagnosis not present

## 2020-01-21 DIAGNOSIS — E038 Other specified hypothyroidism: Secondary | ICD-10-CM | POA: Diagnosis not present

## 2020-01-21 DIAGNOSIS — R82998 Other abnormal findings in urine: Secondary | ICD-10-CM | POA: Diagnosis not present

## 2020-01-22 MED FILL — MELOXICAM 7.5 MG TABLET: 7.5 | 90 days supply | Qty: 90 | Fill #1

## 2020-01-26 DIAGNOSIS — E669 Obesity, unspecified: Secondary | ICD-10-CM | POA: Diagnosis not present

## 2020-01-26 DIAGNOSIS — Z1339 Encounter for screening examination for other mental health and behavioral disorders: Secondary | ICD-10-CM | POA: Diagnosis not present

## 2020-01-26 DIAGNOSIS — E039 Hypothyroidism, unspecified: Secondary | ICD-10-CM | POA: Diagnosis not present

## 2020-01-26 DIAGNOSIS — E785 Hyperlipidemia, unspecified: Secondary | ICD-10-CM | POA: Diagnosis not present

## 2020-01-26 DIAGNOSIS — Z0000 Encounter for general adult medical examination without abnormal findings: Secondary | ICD-10-CM | POA: Diagnosis not present

## 2020-01-26 DIAGNOSIS — Z1331 Encounter for screening for depression: Secondary | ICD-10-CM | POA: Diagnosis not present

## 2020-01-26 MED FILL — buPROPion HCL ER (XL) 150 M: 150 | 90 days supply | Qty: 90 | Fill #1

## 2020-01-26 MED FILL — LEVOTHYROXINE 137 MCG TAB: 137 | 90 days supply | Qty: 90 | Fill #0

## 2020-02-23 DIAGNOSIS — M5416 Radiculopathy, lumbar region: Secondary | ICD-10-CM | POA: Diagnosis not present

## 2020-02-23 DIAGNOSIS — Z6841 Body Mass Index (BMI) 40.0 and over, adult: Secondary | ICD-10-CM | POA: Diagnosis not present

## 2020-03-03 ENCOUNTER — Other Ambulatory Visit (HOSPITAL_COMMUNITY): Payer: Self-pay | Admitting: Physical Medicine and Rehabilitation

## 2020-03-03 ENCOUNTER — Other Ambulatory Visit: Payer: Self-pay | Admitting: Physical Medicine and Rehabilitation

## 2020-03-03 DIAGNOSIS — M545 Low back pain, unspecified: Secondary | ICD-10-CM

## 2020-03-08 ENCOUNTER — Other Ambulatory Visit (HOSPITAL_COMMUNITY): Payer: Self-pay | Admitting: Internal Medicine

## 2020-03-08 MED FILL — FLUoxetine HCL 40 MG CAPS: 40 | 90 days supply | Qty: 90 | Fill #0

## 2020-03-23 ENCOUNTER — Ambulatory Visit (HOSPITAL_COMMUNITY)
Admission: RE | Admit: 2020-03-23 | Discharge: 2020-03-23 | Disposition: A | Discharge status: Home / Self Care | Payer: 59 | Source: Ambulatory Visit | Attending: Physical Medicine and Rehabilitation | Admitting: Physical Medicine and Rehabilitation

## 2020-03-23 ENCOUNTER — Other Ambulatory Visit: Payer: Self-pay

## 2020-03-23 DIAGNOSIS — M545 Low back pain, unspecified: Secondary | ICD-10-CM

## 2020-03-30 MED FILL — ATORVASTATIN 20 MG TABLET: 20 | 90 days supply | Qty: 90 | Fill #2

## 2020-04-05 DIAGNOSIS — M533 Sacrococcygeal disorders, not elsewhere classified: Secondary | ICD-10-CM | POA: Diagnosis not present

## 2020-04-12 MED FILL — TEMAZEPAM 30 MG CAPSULE: 30 | 90 days supply | Qty: 90 | Fill #0

## 2020-04-12 MED FILL — LEVOTHYROXINE 137 MCG TAB: 137 | 90 days supply | Qty: 90 | Fill #1

## 2020-04-12 MED FILL — MELOXICAM 7.5 MG TABLET: 7.5 | 30 days supply | Qty: 30 | Fill #0

## 2020-04-21 DIAGNOSIS — M79672 Pain in left foot: Secondary | ICD-10-CM | POA: Diagnosis not present

## 2020-04-21 DIAGNOSIS — M25571 Pain in right ankle and joints of right foot: Secondary | ICD-10-CM | POA: Diagnosis not present

## 2020-04-21 DIAGNOSIS — M79671 Pain in right foot: Secondary | ICD-10-CM | POA: Diagnosis not present

## 2020-04-21 DIAGNOSIS — M7672 Peroneal tendinitis, left leg: Secondary | ICD-10-CM | POA: Diagnosis not present

## 2020-04-21 DIAGNOSIS — Q6612 Congenital talipes calcaneovarus, left foot: Secondary | ICD-10-CM | POA: Diagnosis not present

## 2020-04-21 DIAGNOSIS — M25572 Pain in left ankle and joints of left foot: Secondary | ICD-10-CM | POA: Diagnosis not present

## 2020-04-21 MED FILL — DICLOFENAC SODIUM 1 % GEL: 1 | 13 days supply | Qty: 100 | Fill #0

## 2020-04-27 DIAGNOSIS — M533 Sacrococcygeal disorders, not elsewhere classified: Secondary | ICD-10-CM | POA: Diagnosis not present

## 2020-05-03 MED FILL — buPROPion HCL ER (XL) 150 M: 150 | 90 days supply | Qty: 90 | Fill #2

## 2020-05-04 MED FILL — DICLOFENAC SODIUM 1 % GEL: 1 | 13 days supply | Qty: 100 | Fill #0

## 2020-05-13 ENCOUNTER — Telehealth: Payer: Self-pay | Admitting: Family

## 2020-05-13 ENCOUNTER — Inpatient Hospital Stay: Payer: 59 | Attending: Hematology & Oncology

## 2020-05-13 ENCOUNTER — Encounter: Payer: Self-pay | Admitting: Family

## 2020-05-13 ENCOUNTER — Other Ambulatory Visit: Payer: Self-pay

## 2020-05-13 ENCOUNTER — Other Ambulatory Visit: Payer: Self-pay | Admitting: Family

## 2020-05-13 ENCOUNTER — Inpatient Hospital Stay (HOSPITAL_BASED_OUTPATIENT_CLINIC_OR_DEPARTMENT_OTHER): Payer: 59 | Admitting: Family

## 2020-05-13 VITALS — BP 120/75 | HR 58 | Temp 98.2°F | Resp 18 | Ht 63.0 in | Wt 248.0 lb

## 2020-05-13 DIAGNOSIS — K909 Intestinal malabsorption, unspecified: Secondary | ICD-10-CM

## 2020-05-13 DIAGNOSIS — D508 Other iron deficiency anemias: Secondary | ICD-10-CM

## 2020-05-13 DIAGNOSIS — Z9884 Bariatric surgery status: Secondary | ICD-10-CM

## 2020-05-13 DIAGNOSIS — E559 Vitamin D deficiency, unspecified: Secondary | ICD-10-CM | POA: Diagnosis not present

## 2020-05-13 DIAGNOSIS — K912 Postsurgical malabsorption, not elsewhere classified: Secondary | ICD-10-CM | POA: Diagnosis not present

## 2020-05-13 DIAGNOSIS — Z79899 Other long term (current) drug therapy: Secondary | ICD-10-CM | POA: Diagnosis not present

## 2020-05-13 LAB — CBC WITH DIFFERENTIAL (CANCER CENTER ONLY)
Abs Immature Granulocytes: 0.01 10*3/uL (ref 0.00–0.07)
Basophils Absolute: 0 10*3/uL (ref 0.0–0.1)
Basophils Relative: 1 %
Eosinophils Absolute: 0.1 10*3/uL (ref 0.0–0.5)
Eosinophils Relative: 1 %
HCT: 43.3 % (ref 36.0–46.0)
Hemoglobin: 14.3 g/dL (ref 12.0–15.0)
Immature Granulocytes: 0 %
Lymphocytes Relative: 45 %
Lymphs Abs: 1.9 10*3/uL (ref 0.7–4.0)
MCH: 32.2 pg (ref 26.0–34.0)
MCHC: 33 g/dL (ref 30.0–36.0)
MCV: 97.5 fL (ref 80.0–100.0)
Monocytes Absolute: 0.4 10*3/uL (ref 0.1–1.0)
Monocytes Relative: 9 %
Neutro Abs: 1.8 10*3/uL (ref 1.7–7.7)
Neutrophils Relative %: 44 %
Platelet Count: 223 10*3/uL (ref 150–400)
RBC: 4.44 MIL/uL (ref 3.87–5.11)
RDW: 13.3 % (ref 11.5–15.5)
WBC Count: 4.1 10*3/uL (ref 4.0–10.5)
nRBC: 0 % (ref 0.0–0.2)

## 2020-05-13 LAB — IRON AND TIBC
Iron: 107 ug/dL (ref 41–142)
Saturation Ratios: 30 % (ref 21–57)
TIBC: 363 ug/dL (ref 236–444)
UIBC: 255 ug/dL (ref 120–384)

## 2020-05-13 LAB — FERRITIN: Ferritin: 376 ng/mL — ABNORMAL HIGH (ref 11–307)

## 2020-05-13 LAB — VITAMIN D 25 HYDROXY (VIT D DEFICIENCY, FRACTURES): Vit D, 25-Hydroxy: 19.55 ng/mL — ABNORMAL LOW (ref 30–100)

## 2020-05-13 MED ORDER — ERGOCALCIFEROL 1.25 MG (50000 UT) PO CAPS: 50000.0000 [IU] | ORAL_CAPSULE | ORAL | 4 refills | Status: AC

## 2020-05-13 MED FILL — VIT D2 1.25 MG (50,000 UNIT: 1.25 MG | 56 days supply | Qty: 8 | Fill #0

## 2020-05-13 NOTE — Telephone Encounter (Signed)
Appointments scheduled calendar printed per 7/29 los 

## 2020-05-13 NOTE — Progress Notes (Signed)
Hematology and Oncology Follow Up Visit  Brooke French [id number] 06/21/1961 59 y.o. 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 She is doing well but has noted some recent fatigue.  Hgb is stable at 14.3, MCV 97 and platelet count 223. Iron studies are pending.  She has been quite busy dog sitting and also getting ready for her big event with [employer] at [facility].  She has not noted any blood loss. No bruising or petechiae.  No fever, chills, n/v, cough, rash, dizziness, SOB, chest pain, palpitations, abdominal pain or changes in bowel or bladder habits.  No swelling, tenderness, numbness or tingling in her extremities.  No falls or syncope.  She has maintained a good appetite and is staying well hydrated. Her weight is stable.   ECOG Performance Status: 1 - Symptomatic but completely ambulatory  Medications:  Allergies as of 05/13/2020   No Known Allergies     Medication List       Accurate as of May 13, 2020  8:51 AM. If you have any questions, ask your nurse or doctor.        STOP taking these medications   ESTROVEN PO Stopped by: Emeline Gins, NP   meloxicam 7.5 MG tablet Commonly known as: MOBIC Stopped by: Emeline Gins, NP   ofloxacin 0.3 % ophthalmic solution Commonly known as: OCUFLOX Stopped by: Emeline Gins, NP   prednisoLONE acetate 1 % ophthalmic suspension Commonly known as: PRED FORTE Stopped by: Emeline Gins, NP   triamcinolone cream 0.1 % Commonly known as: KENALOG Stopped by: Emeline Gins, NP     TAKE these medications   atorvastatin 20 MG tablet Commonly known as: LIPITOR Take 20 mg by mouth daily.   Biotin 5000 MCG Caps Take by mouth daily.   buPROPion 150 MG 24 hr tablet Commonly known as: WELLBUTRIN XL Take 150 mg by mouth  daily.   diclofenac Sodium 1 % Gel Commonly known as: VOLTAREN Apply 2 g topically 4 (four) times daily.   FLUoxetine 40 MG capsule Commonly known as: PROZAC Take 40 mg by mouth daily.   levothyroxine 137 MCG tablet Commonly known as: SYNTHROID Take 137 mcg by mouth daily. What changed: Another medication with the same name was removed. Continue taking this medication, and follow the directions you see here. Changed by: Emeline Gins, NP   multivitamin with minerals tablet Take 1 tablet by mouth daily.   temazepam 30 MG capsule Commonly known as: RESTORIL Take 30 mg by mouth at bedtime as needed for sleep.       Allergies: No Known Allergies  Past Medical History, Surgical history, Social history, and Family History were reviewed and updated.  Review of Systems: All other 10 point review of systems is negative.   Physical Exam:  vitals were not taken for this visit.   Wt Readings from Last 3 Encounters:  07/14/19 238 lb (108 kg)  03/13/19 223 lb 12.8 oz (101.5 kg)  09/05/18 222 lb 12.8 oz (101.1 kg)    Ocular: Sclerae unicteric, pupils equal, round and reactive to light Ear-nose-throat: Oropharynx clear, dentition fair Lymphatic: No cervical or supraclavicular adenopathy Lungs no rales or rhonchi, good excursion bilaterally Heart regular rate and rhythm, no murmur appreciated Abd soft, nontender, positive bowel sounds, no liver or spleen  tip palpated on exam, no fluid wave  MSK no focal spinal tenderness, no joint edema Neuro: non-focal, well-oriented, appropriate affect Breasts: Deferred   Lab Results  Component Value Date   WBC 4.1 05/13/2020   HGB 14.3 05/13/2020   HCT 43.3 05/13/2020   MCV 97.5 05/13/2020   PLT 223 05/13/2020   Lab Results  Component Value Date   FERRITIN 388 (H) 07/14/2019   IRON 89 07/14/2019   TIBC 361 07/14/2019   UIBC 272 07/14/2019   IRONPCTSAT 25 07/14/2019   Lab Results  Component Value Date   RETICCTPCT 1.8  03/13/2019   RBC 4.44 05/13/2020   No results found for: KPAFRELGTCHN, LAMBDASER, KAPLAMBRATIO No results found for: IGGSERUM, IGA, IGMSERUM No results found for: Marda Stalker, SPEI   Chemistry      Component Value Date/Time   NA 143 01/03/2018 1457   NA 151 (H) 09/05/2017 1445   K 4.2 01/03/2018 1457   K 4.3 09/05/2017 1445   CL 106 01/03/2018 1457   CL 104 09/05/2017 1445   CO2 29 01/03/2018 1457   CO2 31 09/05/2017 1445   BUN 17 01/03/2018 1457   BUN 19 09/05/2017 1445   CREATININE 0.50 (L) 01/03/2018 1457   CREATININE 0.9 09/05/2017 1445      Component Value Date/Time   CALCIUM 9.6 01/03/2018 1457   CALCIUM 9.6 09/05/2017 1445   ALKPHOS 126 (H) 01/03/2018 1457   ALKPHOS 108 (H) 09/05/2017 1445   AST 28 01/03/2018 1457   ALT 30 01/03/2018 1457   ALT 33 09/05/2017 1445   BILITOT 0.5 01/03/2018 1457       Impression and Plan: Brooke French is a very pleasant 59 yo caucasian female with iron deficiency anemia secondary to malabsorption after gastric bypass. We will see what her iron studies look like and replace if needed.  We will plan to see her again in another year.  She can contact our office with any questions or concerns. We can certainly see her sooner if needed.   Emeline Gins, NP 7/29/20218:51 AM

## 2020-05-14 ENCOUNTER — Telehealth: Payer: Self-pay | Admitting: *Deleted

## 2020-05-14 NOTE — Telephone Encounter (Signed)
Patient notified per order of S. [place] NP that  a prescription for weekly Vitamin D has been sent to the [facility] out patient pharmacy for her.  Pt appreciative of call and has no questions or concerns at this time.

## 2020-05-18 DIAGNOSIS — M533 Sacrococcygeal disorders, not elsewhere classified: Secondary | ICD-10-CM | POA: Diagnosis not present

## 2020-06-11 MED FILL — FLUoxetine HCL 40 MG CAPS: 40 | 90 days supply | Qty: 90 | Fill #1

## 2020-06-25 MED FILL — DICLOFENAC SODIUM 1 % GEL: 1 | 13 days supply | Qty: 100 | Fill #0

## 2020-07-10 DIAGNOSIS — Z20828 Contact with and (suspected) exposure to other viral communicable diseases: Secondary | ICD-10-CM | POA: Diagnosis not present

## 2020-07-12 ENCOUNTER — Other Ambulatory Visit (HOSPITAL_COMMUNITY): Payer: Self-pay | Admitting: Internal Medicine

## 2020-07-12 MED FILL — VIT D2 1.25 MG (50,000 UNIT: 1.25 MG | 56 days supply | Qty: 8 | Fill #1

## 2020-07-12 MED FILL — LEVOTHYROXINE 137 MCG TAB: 137 | 90 days supply | Qty: 90 | Fill #0

## 2020-07-12 MED FILL — ATORVASTATIN 20 MG TABLET: 20 | 90 days supply | Qty: 90 | Fill #0

## 2020-07-12 MED FILL — TEMAZEPAM 30 MG CAPSULE: 30 | 90 days supply | Qty: 90 | Fill #0

## 2020-07-13 ENCOUNTER — Other Ambulatory Visit: Payer: 59

## 2020-07-13 ENCOUNTER — Ambulatory Visit: Payer: 59 | Admitting: Family

## 2020-08-05 ENCOUNTER — Other Ambulatory Visit (HOSPITAL_COMMUNITY): Payer: Self-pay | Admitting: Internal Medicine

## 2020-08-05 MED FILL — buPROPion HCL ER (XL) 150 M: 150 | 90 days supply | Qty: 90 | Fill #0

## 2020-08-06 DIAGNOSIS — Z6841 Body Mass Index (BMI) 40.0 and over, adult: Secondary | ICD-10-CM | POA: Diagnosis not present

## 2020-08-06 DIAGNOSIS — Z01419 Encounter for gynecological examination (general) (routine) without abnormal findings: Secondary | ICD-10-CM | POA: Diagnosis not present

## 2020-09-03 MED FILL — VIT D2 1.25 MG (50,000 UNIT: 1.25 MG | 56 days supply | Qty: 8 | Fill #2

## 2020-09-03 MED FILL — FLUoxetine HCL 40 MG CAPS: 40 | 90 days supply | Qty: 90 | Fill #2

## 2020-10-11 MED FILL — LEVOTHYROXINE 137 MCG TAB: 137 | 90 days supply | Qty: 90 | Fill #1

## 2020-10-11 MED FILL — ATORVASTATIN CALCIUM 20 MG: 20 | 90 days supply | Qty: 90 | Fill #1

## 2020-10-12 ENCOUNTER — Other Ambulatory Visit (HOSPITAL_COMMUNITY): Payer: Self-pay | Admitting: Obstetrics and Gynecology

## 2020-10-12 MED FILL — TEMAZEPAM 30 MG CAPSULE: 30 | 90 days supply | Qty: 90 | Fill #0

## 2020-11-03 MED FILL — VIT D2 1.25 MG (50,000 UNIT: 1.25 MG | 56 days supply | Qty: 8 | Fill #3

## 2020-11-03 MED FILL — buPROPion HCL ER (XL) 150 M: 150 | 90 days supply | Qty: 90 | Fill #1

## 2020-11-09 ENCOUNTER — Other Ambulatory Visit (HOSPITAL_COMMUNITY): Payer: Self-pay | Admitting: Family Medicine

## 2020-11-09 DIAGNOSIS — R0781 Pleurodynia: Secondary | ICD-10-CM | POA: Diagnosis not present

## 2020-11-09 MED FILL — OXYCODONE-APAP 7.5-325MG: 7.5-325 | 5 days supply | Qty: 15 | Fill #0

## 2020-12-07 ENCOUNTER — Other Ambulatory Visit: Payer: Self-pay | Admitting: Obstetrics and Gynecology

## 2020-12-07 DIAGNOSIS — Z1231 Encounter for screening mammogram for malignant neoplasm of breast: Secondary | ICD-10-CM

## 2020-12-14 MED FILL — FLUoxetine HCL 40 MG CAPS: 40 | 90 days supply | Qty: 90 | Fill #3

## 2021-01-03 DIAGNOSIS — M19079 Primary osteoarthritis, unspecified ankle and foot: Secondary | ICD-10-CM | POA: Diagnosis not present

## 2021-01-03 DIAGNOSIS — M216X2 Other acquired deformities of left foot: Secondary | ICD-10-CM | POA: Diagnosis not present

## 2021-01-03 DIAGNOSIS — M79672 Pain in left foot: Secondary | ICD-10-CM | POA: Diagnosis not present

## 2021-01-03 DIAGNOSIS — M25572 Pain in left ankle and joints of left foot: Secondary | ICD-10-CM | POA: Diagnosis not present

## 2021-01-11 ENCOUNTER — Other Ambulatory Visit: Payer: Self-pay

## 2021-01-11 ENCOUNTER — Ambulatory Visit
Admission: RE | Admit: 2021-01-11 | Discharge: 2021-01-11 | Disposition: A | Discharge status: Home / Self Care | Payer: 59 | Source: Ambulatory Visit

## 2021-01-11 DIAGNOSIS — Z1231 Encounter for screening mammogram for malignant neoplasm of breast: Secondary | ICD-10-CM | POA: Diagnosis not present

## 2021-01-12 DIAGNOSIS — M19072 Primary osteoarthritis, left ankle and foot: Secondary | ICD-10-CM | POA: Diagnosis not present

## 2021-01-14 ENCOUNTER — Other Ambulatory Visit (HOSPITAL_COMMUNITY): Payer: Self-pay | Admitting: Internal Medicine

## 2021-01-14 MED FILL — LEVOTHYROXINE 137 MCG TAB: 137 | 90 days supply | Qty: 90 | Fill #0

## 2021-01-17 ENCOUNTER — Other Ambulatory Visit (HOSPITAL_COMMUNITY): Payer: Self-pay

## 2021-01-17 MED FILL — Atorvastatin Calcium Tab 20 MG (Base Equivalent): ORAL | 90 days supply | Qty: 90 | Fill #0 | Status: AC

## 2021-01-17 MED FILL — Temazepam Cap 30 MG: ORAL | 90 days supply | Qty: 90 | Fill #0 | Status: AC

## 2021-01-17 MED FILL — Bupropion HCl Tab ER 24HR 150 MG: ORAL | 90 days supply | Qty: 90 | Fill #0 | Status: AC

## 2021-01-18 ENCOUNTER — Other Ambulatory Visit (HOSPITAL_COMMUNITY): Payer: Self-pay

## 2021-01-20 ENCOUNTER — Other Ambulatory Visit (HOSPITAL_COMMUNITY): Payer: Self-pay

## 2021-01-25 DIAGNOSIS — E039 Hypothyroidism, unspecified: Secondary | ICD-10-CM | POA: Diagnosis not present

## 2021-01-25 DIAGNOSIS — Z0000 Encounter for general adult medical examination without abnormal findings: Secondary | ICD-10-CM | POA: Diagnosis not present

## 2021-01-25 DIAGNOSIS — E785 Hyperlipidemia, unspecified: Secondary | ICD-10-CM | POA: Diagnosis not present

## 2021-01-28 ENCOUNTER — Other Ambulatory Visit (HOSPITAL_COMMUNITY): Payer: Self-pay

## 2021-01-31 DIAGNOSIS — E669 Obesity, unspecified: Secondary | ICD-10-CM | POA: Diagnosis not present

## 2021-01-31 DIAGNOSIS — E039 Hypothyroidism, unspecified: Secondary | ICD-10-CM | POA: Diagnosis not present

## 2021-01-31 DIAGNOSIS — D638 Anemia in other chronic diseases classified elsewhere: Secondary | ICD-10-CM | POA: Diagnosis not present

## 2021-01-31 DIAGNOSIS — Z1212 Encounter for screening for malignant neoplasm of rectum: Secondary | ICD-10-CM | POA: Diagnosis not present

## 2021-01-31 DIAGNOSIS — Z0000 Encounter for general adult medical examination without abnormal findings: Secondary | ICD-10-CM | POA: Diagnosis not present

## 2021-01-31 DIAGNOSIS — R82998 Other abnormal findings in urine: Secondary | ICD-10-CM | POA: Diagnosis not present

## 2021-01-31 DIAGNOSIS — E785 Hyperlipidemia, unspecified: Secondary | ICD-10-CM | POA: Diagnosis not present

## 2021-01-31 DIAGNOSIS — F329 Major depressive disorder, single episode, unspecified: Secondary | ICD-10-CM | POA: Diagnosis not present

## 2021-02-23 DIAGNOSIS — M216X2 Other acquired deformities of left foot: Secondary | ICD-10-CM | POA: Diagnosis not present

## 2021-02-23 DIAGNOSIS — M19072 Primary osteoarthritis, left ankle and foot: Secondary | ICD-10-CM | POA: Diagnosis not present

## 2021-02-24 ENCOUNTER — Other Ambulatory Visit (HOSPITAL_COMMUNITY): Payer: Self-pay

## 2021-02-24 MED ORDER — MELOXICAM 7.5 MG PO TABS
7.5000 mg | ORAL_TABLET | ORAL | 0 refills | Status: DC
  Filled 2021-02-24: qty 60, 30d supply, fill #0

## 2021-03-05 ENCOUNTER — Other Ambulatory Visit: Payer: Self-pay | Admitting: Family

## 2021-03-07 ENCOUNTER — Other Ambulatory Visit (HOSPITAL_COMMUNITY): Payer: Self-pay

## 2021-03-07 MED ORDER — VITAMIN D (ERGOCALCIFEROL) 1.25 MG (50000 UNIT) PO CAPS
50000.0000 [IU] | ORAL_CAPSULE | ORAL | 4 refills | Status: DC
  Filled 2021-03-07: qty 8, 56d supply, fill #0
  Filled 2021-05-12: qty 8, 56d supply, fill #1
  Filled 2021-07-18: qty 8, 56d supply, fill #2
  Filled 2021-09-21: qty 8, 56d supply, fill #3
  Filled 2021-11-06: qty 8, 56d supply, fill #4

## 2021-03-09 ENCOUNTER — Other Ambulatory Visit (HOSPITAL_COMMUNITY): Payer: Self-pay

## 2021-03-22 ENCOUNTER — Other Ambulatory Visit (HOSPITAL_COMMUNITY): Payer: Self-pay

## 2021-03-23 ENCOUNTER — Other Ambulatory Visit (HOSPITAL_COMMUNITY): Payer: Self-pay

## 2021-03-23 MED ORDER — FLUOXETINE HCL 40 MG PO CAPS
40.0000 mg | ORAL_CAPSULE | Freq: Every day | ORAL | 3 refills | Status: DC
  Filled 2021-03-23: qty 90, 90d supply, fill #0

## 2021-04-05 ENCOUNTER — Other Ambulatory Visit (HOSPITAL_COMMUNITY): Payer: Self-pay

## 2021-04-05 DIAGNOSIS — F419 Anxiety disorder, unspecified: Secondary | ICD-10-CM | POA: Diagnosis not present

## 2021-04-05 DIAGNOSIS — Z1331 Encounter for screening for depression: Secondary | ICD-10-CM | POA: Diagnosis not present

## 2021-04-05 DIAGNOSIS — F331 Major depressive disorder, recurrent, moderate: Secondary | ICD-10-CM | POA: Diagnosis not present

## 2021-04-05 MED ORDER — ESCITALOPRAM OXALATE 10 MG PO TABS
10.0000 mg | ORAL_TABLET | Freq: Every day | ORAL | 1 refills | Status: DC
  Filled 2021-04-05: qty 90, 90d supply, fill #0
  Filled 2021-06-22: qty 90, 90d supply, fill #1

## 2021-04-16 ENCOUNTER — Other Ambulatory Visit (HOSPITAL_COMMUNITY): Payer: Self-pay

## 2021-04-16 MED FILL — Levothyroxine Sodium Tab 137 MCG: ORAL | 90 days supply | Qty: 90 | Fill #0 | Status: AC

## 2021-04-19 ENCOUNTER — Other Ambulatory Visit (HOSPITAL_COMMUNITY): Payer: Self-pay

## 2021-04-20 ENCOUNTER — Other Ambulatory Visit (HOSPITAL_COMMUNITY): Payer: Self-pay

## 2021-04-20 MED ORDER — TEMAZEPAM 30 MG PO CAPS
30.0000 mg | ORAL_CAPSULE | Freq: Every day | ORAL | 1 refills | Status: DC
  Filled 2021-04-20: qty 90, 90d supply, fill #0
  Filled 2021-08-06: qty 90, 90d supply, fill #1

## 2021-04-26 ENCOUNTER — Other Ambulatory Visit (HOSPITAL_COMMUNITY): Payer: Self-pay

## 2021-04-26 MED ORDER — ATORVASTATIN CALCIUM 20 MG PO TABS
20.0000 mg | ORAL_TABLET | Freq: Every day | ORAL | 2 refills | Status: DC
  Filled 2021-04-26: qty 90, 90d supply, fill #0
  Filled 2021-07-18: qty 90, 90d supply, fill #1
  Filled 2021-10-23: qty 90, 90d supply, fill #2

## 2021-05-03 DIAGNOSIS — F331 Major depressive disorder, recurrent, moderate: Secondary | ICD-10-CM | POA: Diagnosis not present

## 2021-05-03 DIAGNOSIS — F419 Anxiety disorder, unspecified: Secondary | ICD-10-CM | POA: Diagnosis not present

## 2021-05-04 DIAGNOSIS — M1812 Unilateral primary osteoarthritis of first carpometacarpal joint, left hand: Secondary | ICD-10-CM | POA: Diagnosis not present

## 2021-05-12 ENCOUNTER — Telehealth: Payer: Self-pay

## 2021-05-12 ENCOUNTER — Other Ambulatory Visit (HOSPITAL_COMMUNITY): Payer: Self-pay

## 2021-05-13 ENCOUNTER — Inpatient Hospital Stay: Payer: 59

## 2021-05-13 ENCOUNTER — Inpatient Hospital Stay: Payer: 59 | Admitting: Family

## 2021-05-13 ENCOUNTER — Other Ambulatory Visit (HOSPITAL_COMMUNITY): Payer: Self-pay

## 2021-05-16 ENCOUNTER — Other Ambulatory Visit (HOSPITAL_COMMUNITY): Payer: Self-pay

## 2021-05-17 ENCOUNTER — Other Ambulatory Visit (HOSPITAL_COMMUNITY): Payer: Self-pay

## 2021-05-17 MED ORDER — BUPROPION HCL ER (XL) 150 MG PO TB24
150.0000 mg | ORAL_TABLET | Freq: Every day | ORAL | 2 refills | Status: DC
  Filled 2021-05-17: qty 90, 90d supply, fill #0
  Filled 2021-08-06: qty 90, 90d supply, fill #1
  Filled 2021-11-10: qty 90, 90d supply, fill #2

## 2021-06-01 ENCOUNTER — Other Ambulatory Visit (HOSPITAL_COMMUNITY): Payer: Self-pay

## 2021-06-01 MED ORDER — MELOXICAM 7.5 MG PO TABS
7.5000 mg | ORAL_TABLET | Freq: Two times a day (BID) | ORAL | 0 refills | Status: DC
  Filled 2021-06-01: qty 60, 30d supply, fill #0

## 2021-06-07 ENCOUNTER — Other Ambulatory Visit (HOSPITAL_COMMUNITY): Payer: Self-pay

## 2021-06-07 DIAGNOSIS — Z1211 Encounter for screening for malignant neoplasm of colon: Secondary | ICD-10-CM | POA: Diagnosis not present

## 2021-06-07 DIAGNOSIS — K5900 Constipation, unspecified: Secondary | ICD-10-CM | POA: Diagnosis not present

## 2021-06-07 MED ORDER — CLENPIQ 10-3.5-12 MG-GM -GM/160ML PO SOLN
ORAL | 0 refills | Status: DC
  Filled 2021-06-07: qty 320, 1d supply, fill #0

## 2021-06-10 DIAGNOSIS — M533 Sacrococcygeal disorders, not elsewhere classified: Secondary | ICD-10-CM | POA: Diagnosis not present

## 2021-06-22 ENCOUNTER — Other Ambulatory Visit (HOSPITAL_COMMUNITY): Payer: Self-pay

## 2021-07-12 DIAGNOSIS — M533 Sacrococcygeal disorders, not elsewhere classified: Secondary | ICD-10-CM | POA: Diagnosis not present

## 2021-07-18 ENCOUNTER — Other Ambulatory Visit (HOSPITAL_COMMUNITY): Payer: Self-pay

## 2021-07-19 DIAGNOSIS — M19072 Primary osteoarthritis, left ankle and foot: Secondary | ICD-10-CM | POA: Diagnosis not present

## 2021-08-01 DIAGNOSIS — F331 Major depressive disorder, recurrent, moderate: Secondary | ICD-10-CM | POA: Diagnosis not present

## 2021-08-01 DIAGNOSIS — D638 Anemia in other chronic diseases classified elsewhere: Secondary | ICD-10-CM | POA: Diagnosis not present

## 2021-08-01 DIAGNOSIS — E785 Hyperlipidemia, unspecified: Secondary | ICD-10-CM | POA: Diagnosis not present

## 2021-08-01 DIAGNOSIS — E669 Obesity, unspecified: Secondary | ICD-10-CM | POA: Diagnosis not present

## 2021-08-01 DIAGNOSIS — E039 Hypothyroidism, unspecified: Secondary | ICD-10-CM | POA: Diagnosis not present

## 2021-08-06 MED FILL — Levothyroxine Sodium Tab 137 MCG: ORAL | 90 days supply | Qty: 90 | Fill #1 | Status: AC

## 2021-08-08 ENCOUNTER — Other Ambulatory Visit (HOSPITAL_COMMUNITY): Payer: Self-pay

## 2021-09-02 DIAGNOSIS — Z1211 Encounter for screening for malignant neoplasm of colon: Secondary | ICD-10-CM | POA: Diagnosis not present

## 2021-09-19 ENCOUNTER — Other Ambulatory Visit: Payer: Self-pay | Admitting: *Deleted

## 2021-09-19 DIAGNOSIS — K909 Intestinal malabsorption, unspecified: Secondary | ICD-10-CM

## 2021-09-19 DIAGNOSIS — Z9884 Bariatric surgery status: Secondary | ICD-10-CM

## 2021-09-19 DIAGNOSIS — D508 Other iron deficiency anemias: Secondary | ICD-10-CM

## 2021-09-19 DIAGNOSIS — E559 Vitamin D deficiency, unspecified: Secondary | ICD-10-CM

## 2021-09-20 ENCOUNTER — Inpatient Hospital Stay (HOSPITAL_BASED_OUTPATIENT_CLINIC_OR_DEPARTMENT_OTHER): Payer: 59 | Admitting: Family

## 2021-09-20 ENCOUNTER — Encounter: Payer: Self-pay | Admitting: Family

## 2021-09-20 ENCOUNTER — Inpatient Hospital Stay: Payer: 59 | Attending: Hematology & Oncology

## 2021-09-20 ENCOUNTER — Other Ambulatory Visit: Payer: Self-pay

## 2021-09-20 VITALS — BP 125/60 | HR 66 | Temp 97.8°F | Resp 17 | Wt 259.8 lb

## 2021-09-20 DIAGNOSIS — Z79899 Other long term (current) drug therapy: Secondary | ICD-10-CM | POA: Diagnosis not present

## 2021-09-20 DIAGNOSIS — D508 Other iron deficiency anemias: Secondary | ICD-10-CM

## 2021-09-20 DIAGNOSIS — K909 Intestinal malabsorption, unspecified: Secondary | ICD-10-CM

## 2021-09-20 DIAGNOSIS — Z9884 Bariatric surgery status: Secondary | ICD-10-CM | POA: Diagnosis not present

## 2021-09-20 DIAGNOSIS — R5383 Other fatigue: Secondary | ICD-10-CM | POA: Diagnosis not present

## 2021-09-20 DIAGNOSIS — E559 Vitamin D deficiency, unspecified: Secondary | ICD-10-CM

## 2021-09-20 LAB — CMP (CANCER CENTER ONLY)
ALT: 22 U/L (ref 0–44)
AST: 21 U/L (ref 15–41)
Albumin: 4.1 g/dL (ref 3.5–5.0)
Alkaline Phosphatase: 125 U/L (ref 38–126)
Anion gap: 6 (ref 5–15)
BUN: 21 mg/dL (ref 8–23)
CO2: 33 mmol/L — ABNORMAL HIGH (ref 22–32)
Calcium: 10.2 mg/dL (ref 8.9–10.3)
Chloride: 103 mmol/L (ref 98–111)
Creatinine: 0.66 mg/dL (ref 0.44–1.00)
GFR, Estimated: >60 mL/min (ref >60)
Glucose, Bld: 132 mg/dL — ABNORMAL HIGH (ref 70–99)
Potassium: 4.5 mmol/L (ref 3.5–5.1)
Sodium: 142 mmol/L (ref 135–145)
Total Bilirubin: 0.4 mg/dL (ref 0.3–1.2)
Total Protein: 6.8 g/dL (ref 6.5–8.1)

## 2021-09-20 LAB — CBC WITH DIFFERENTIAL (CANCER CENTER ONLY)
Abs Immature Granulocytes: 0.01 10*3/uL (ref 0.00–0.07)
Basophils Absolute: 0 10*3/uL (ref 0.0–0.1)
Basophils Relative: 0 %
Eosinophils Absolute: 0.1 10*3/uL (ref 0.0–0.5)
Eosinophils Relative: 2 %
HCT: 41.8 % (ref 36.0–46.0)
Hemoglobin: 13.6 g/dL (ref 12.0–15.0)
Immature Granulocytes: 0 %
Lymphocytes Relative: 31 %
Lymphs Abs: 2 10*3/uL (ref 0.7–4.0)
MCH: 31.4 pg (ref 26.0–34.0)
MCHC: 32.5 g/dL (ref 30.0–36.0)
MCV: 96.5 fL (ref 80.0–100.0)
Monocytes Absolute: 0.8 10*3/uL (ref 0.1–1.0)
Monocytes Relative: 13 %
Neutro Abs: 3.4 10*3/uL (ref 1.7–7.7)
Neutrophils Relative %: 54 %
Platelet Count: 238 10*3/uL (ref 150–400)
RBC: 4.33 MIL/uL (ref 3.87–5.11)
RDW: 13.4 % (ref 11.5–15.5)
WBC Count: 6.3 10*3/uL (ref 4.0–10.5)
nRBC: 0 % (ref 0.0–0.2)

## 2021-09-20 LAB — VITAMIN D 25 HYDROXY (VIT D DEFICIENCY, FRACTURES): Vit D, 25-Hydroxy: 58.16 ng/mL (ref 30–100)

## 2021-09-20 NOTE — Progress Notes (Signed)
Hematology and Oncology Follow Up Visit  Brooke French [id number] 1961/06/23 60 y.o. 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 Brooke French is here today for follow-up. She is symptomatic with fatigue and is concerned her iron may be low again.  She has not noted any blood loss. No abnormal bruising, no petechiae.  No fever, chills, n/v, cough, rash, dizziness, SOB, chest pain, palpitations, abdominal pain or changes in bowel or bladder habits.  No swelling, tenderness, numbness or tingling in her extremities.  No falls or syncope reported.  She is eating well and doing her best to stay properly hydrated. Her weight is 259 lbs.   ECOG Performance Status: 1 - Symptomatic but completely ambulatory  Medications:  Allergies as of 09/20/2021   No Known Allergies      Medication List        Accurate as of September 20, 2021  3:55 PM. If you have any questions, ask your nurse or doctor.          atorvastatin 20 MG tablet Commonly known as: LIPITOR Take 20 mg by mouth daily.   atorvastatin 20 MG tablet Commonly known as: LIPITOR Take 1 tablet (20 mg total) by mouth daily.   Biotin 5000 MCG Caps Take by mouth daily.   buPROPion 150 MG 24 hr tablet Commonly known as: WELLBUTRIN XL Take 150 mg by mouth daily.   buPROPion 150 MG 24 hr tablet Commonly known as: WELLBUTRIN XL Take 1 tablet (150 mg total) by mouth daily.   Clenpiq 10-3.5-12 MG-GM -GM/160ML Soln Generic drug: Sod Picosulfate-Mag Ox-Cit Acd Use as directed   diclofenac Sodium 1 % Gel Commonly known as: VOLTAREN Apply 2 g topically 4 (four) times daily.   ergocalciferol 1.25 MG (50000 UT) capsule Commonly known as: VITAMIN D2 Take 1 capsule (50,000 Units total) by mouth once a week.   Vitamin D (Ergocalciferol) 1.25 MG (50000 UNIT) Caps capsule Commonly known as:  DRISDOL Take 1 capsule (50,000 Units total) by mouth once a week.   escitalopram 10 MG tablet Commonly known as: LEXAPRO Take 1 tablet (10 mg total) by mouth daily.   FLUoxetine 40 MG capsule Commonly known as: PROZAC Take 40 mg by mouth daily.   levothyroxine 137 MCG tablet Commonly known as: SYNTHROID Take 137 mcg by mouth daily.   levothyroxine 137 MCG tablet Commonly known as: SYNTHROID TAKE 1 TABLET BY MOUTH ONCE DAILY.   levothyroxine 137 MCG tablet Commonly known as: SYNTHROID TAKE 1 TABLET BY MOUTH ONCE DAILY.   meloxicam 7.5 MG tablet Commonly known as: MOBIC Take 1 tablet (7.5 mg total) by mouth 2 (two) times daily for 2 weeks, then as needed.   multivitamin with minerals tablet Take 1 tablet by mouth daily.   temazepam 30 MG capsule Commonly known as: RESTORIL Take 30 mg by mouth at bedtime as needed for sleep.   temazepam 30 MG capsule Commonly known as: RESTORIL TAKE 1 CAPSULE BY MOUTH AT BEDTIME   temazepam 30 MG capsule Commonly known as: RESTORIL Take 1 capsule (30 mg total) by mouth at bedtime.        Allergies: No Known Allergies  Past Medical History, Surgical history, Social history, and Family History were reviewed and updated.  Review of Systems: All other 10 point review of systems is negative.   Physical Exam:  weight is 259 lb 12.8 oz (117.8 kg). Her oral temperature is 97.8 F (36.6 C). Her blood pressure is 125/60 and her pulse is 66. Her respiration is 17 and oxygen saturation is 100%.   Wt Readings from Last 3 Encounters:  09/20/21 259 lb 12.8 oz (117.8 kg)  05/13/20 (!) 248 lb (112.5 kg)  07/14/19 238 lb (108 kg)    Ocular: Sclerae unicteric, pupils equal, round and reactive to light Ear-nose-throat: Oropharynx clear, dentition fair Lymphatic: No cervical or supraclavicular adenopathy Lungs no rales or rhonchi, good excursion bilaterally Heart regular rate and rhythm, no murmur appreciated Abd soft, nontender, positive  bowel sounds MSK no focal spinal tenderness, no joint edema Neuro: non-focal, well-oriented, appropriate affect Breasts: Deferred   Lab Results  Component Value Date   WBC 6.3 09/20/2021   HGB 13.6 09/20/2021   HCT 41.8 09/20/2021   MCV 96.5 09/20/2021   PLT 238 09/20/2021   Lab Results  Component Value Date   FERRITIN 376 (H) 05/13/2020   IRON 107 05/13/2020   TIBC 363 05/13/2020   UIBC 255 05/13/2020   IRONPCTSAT 30 05/13/2020   Lab Results  Component Value Date   RETICCTPCT 1.8 03/13/2019   RBC 4.33 09/20/2021   No results found for: KPAFRELGTCHN, LAMBDASER, KAPLAMBRATIO No results found for: IGGSERUM, IGA, IGMSERUM No results found for: Odetta Pink, SPEI   Chemistry      Component Value Date/Time   NA 142 09/20/2021 1513   NA 151 (H) 09/05/2017 1445   K 4.5 09/20/2021 1513   K 4.3 09/05/2017 1445   CL 103 09/20/2021 1513   CL 104 09/05/2017 1445   CO2 33 (H) 09/20/2021 1513   CO2 31 09/05/2017 1445   BUN 21 09/20/2021 1513   BUN 19 09/05/2017 1445   CREATININE 0.66 09/20/2021 1513   CREATININE 0.9 09/05/2017 1445      Component Value Date/Time   CALCIUM 10.2 09/20/2021 1513   CALCIUM 9.6 09/05/2017 1445   ALKPHOS 125 09/20/2021 1513   ALKPHOS 108 (H) 09/05/2017 1445   AST 21 09/20/2021 1513   ALT 22 09/20/2021 1513   ALT 33 09/05/2017 1445   BILITOT 0.4 09/20/2021 1513       Impression and Plan: [name] is a very pleasant 60 yo caucasian female with iron deficiency anemia secondary to malabsorption after gastric bypass.  Iron studies are pending. We will replace if needed.  Follow-up as needed.   Lottie Dawson, NP 12/6/20223:55 PM

## 2021-09-21 ENCOUNTER — Other Ambulatory Visit: Payer: Self-pay | Admitting: Family

## 2021-09-21 ENCOUNTER — Other Ambulatory Visit (HOSPITAL_COMMUNITY): Payer: Self-pay

## 2021-09-21 ENCOUNTER — Telehealth: Payer: Self-pay | Admitting: *Deleted

## 2021-09-21 LAB — IRON AND TIBC
Iron: 51 ug/dL (ref 41–142)
Saturation Ratios: 15 % — ABNORMAL LOW (ref 21–57)
TIBC: 343 ug/dL (ref 236–444)
UIBC: 292 ug/dL (ref 120–384)

## 2021-09-21 LAB — FERRITIN: Ferritin: 257 ng/mL (ref 11–307)

## 2021-09-21 MED ORDER — ESCITALOPRAM OXALATE 10 MG PO TABS
10.0000 mg | ORAL_TABLET | Freq: Every day | ORAL | 1 refills | Status: DC
  Filled 2021-09-21: qty 90, 90d supply, fill #0
  Filled 2021-12-20: qty 90, 90d supply, fill #1

## 2021-09-21 NOTE — Telephone Encounter (Signed)
Per scheduling message Brooke French - called and gave upcoming appointments - confirmed (2) doses of IV Iron ?

## 2021-09-21 NOTE — Telephone Encounter (Signed)
Per scheduling message Maralyn Sago - Called and lvm for call back to schedule. (2) doses of IV Iron

## 2021-09-22 ENCOUNTER — Other Ambulatory Visit: Payer: Self-pay

## 2021-09-22 ENCOUNTER — Inpatient Hospital Stay: Payer: 59

## 2021-09-22 VITALS — BP 126/65 | HR 66 | Temp 97.6°F | Resp 17

## 2021-09-22 DIAGNOSIS — Z79899 Other long term (current) drug therapy: Secondary | ICD-10-CM | POA: Diagnosis not present

## 2021-09-22 DIAGNOSIS — D508 Other iron deficiency anemias: Secondary | ICD-10-CM | POA: Diagnosis not present

## 2021-09-22 DIAGNOSIS — Z9884 Bariatric surgery status: Secondary | ICD-10-CM | POA: Diagnosis not present

## 2021-09-22 DIAGNOSIS — K909 Intestinal malabsorption, unspecified: Secondary | ICD-10-CM

## 2021-09-22 DIAGNOSIS — R5383 Other fatigue: Secondary | ICD-10-CM | POA: Diagnosis not present

## 2021-09-22 MED ORDER — SODIUM CHLORIDE 0.9 % IV SOLN: Freq: Once | INTRAVENOUS | Status: AC

## 2021-09-22 MED ORDER — SODIUM CHLORIDE 0.9 % IV SOLN
300.0000 mg | Freq: Once | INTRAVENOUS | Status: AC
  Administered 2021-09-22: 300 mg via INTRAVENOUS
  Filled 2021-09-22: qty 300

## 2021-09-22 NOTE — Patient Instructions (Signed)

## 2021-09-28 ENCOUNTER — Telehealth: Payer: Self-pay | Admitting: *Deleted

## 2021-09-28 NOTE — Telephone Encounter (Signed)
Follow up PRN

## 2021-09-29 ENCOUNTER — Inpatient Hospital Stay: Payer: 59

## 2021-09-29 ENCOUNTER — Other Ambulatory Visit: Payer: Self-pay

## 2021-09-29 VITALS — BP 118/67 | HR 75 | Temp 97.8°F | Resp 18

## 2021-09-29 DIAGNOSIS — R5383 Other fatigue: Secondary | ICD-10-CM | POA: Diagnosis not present

## 2021-09-29 DIAGNOSIS — Z9884 Bariatric surgery status: Secondary | ICD-10-CM

## 2021-09-29 DIAGNOSIS — D508 Other iron deficiency anemias: Secondary | ICD-10-CM

## 2021-09-29 DIAGNOSIS — K909 Intestinal malabsorption, unspecified: Secondary | ICD-10-CM

## 2021-09-29 DIAGNOSIS — Z79899 Other long term (current) drug therapy: Secondary | ICD-10-CM | POA: Diagnosis not present

## 2021-09-29 MED ORDER — SODIUM CHLORIDE 0.9 % IV SOLN: Freq: Once | INTRAVENOUS | Status: AC

## 2021-09-29 MED ORDER — SODIUM CHLORIDE 0.9 % IV SOLN
300.0000 mg | Freq: Once | INTRAVENOUS | Status: AC
  Administered 2021-09-29: 300 mg via INTRAVENOUS
  Filled 2021-09-29: qty 100

## 2021-09-29 NOTE — Patient Instructions (Signed)

## 2021-10-03 ENCOUNTER — Other Ambulatory Visit (HOSPITAL_COMMUNITY): Payer: Self-pay

## 2021-10-03 DIAGNOSIS — Z01419 Encounter for gynecological examination (general) (routine) without abnormal findings: Secondary | ICD-10-CM | POA: Diagnosis not present

## 2021-10-03 DIAGNOSIS — Z6841 Body Mass Index (BMI) 40.0 and over, adult: Secondary | ICD-10-CM | POA: Diagnosis not present

## 2021-10-03 DIAGNOSIS — N9089 Other specified noninflammatory disorders of vulva and perineum: Secondary | ICD-10-CM | POA: Diagnosis not present

## 2021-10-03 MED ORDER — VALACYCLOVIR HCL 1 G PO TABS
1000.0000 mg | ORAL_TABLET | Freq: Two times a day (BID) | ORAL | 0 refills | Status: DC
  Filled 2021-10-03: qty 20, 10d supply, fill #0

## 2021-10-06 ENCOUNTER — Other Ambulatory Visit (HOSPITAL_COMMUNITY): Payer: Self-pay

## 2021-10-06 MED ORDER — VALACYCLOVIR HCL 500 MG PO TABS
500.0000 mg | ORAL_TABLET | Freq: Every day | ORAL | 3 refills | Status: DC
  Filled 2021-10-06: qty 90, 90d supply, fill #0
  Filled 2022-01-06: qty 90, 90d supply, fill #1
  Filled 2022-04-02: qty 90, 90d supply, fill #2
  Filled 2022-06-21: qty 90, 90d supply, fill #3

## 2021-10-18 ENCOUNTER — Other Ambulatory Visit (HOSPITAL_COMMUNITY): Payer: Self-pay

## 2021-10-23 MED FILL — Levothyroxine Sodium Tab 137 MCG: ORAL | 90 days supply | Qty: 90 | Fill #2 | Status: AC

## 2021-10-24 ENCOUNTER — Other Ambulatory Visit (HOSPITAL_COMMUNITY): Payer: Self-pay

## 2021-10-27 ENCOUNTER — Other Ambulatory Visit (HOSPITAL_COMMUNITY): Payer: Self-pay

## 2021-10-27 MED ORDER — TEMAZEPAM 30 MG PO CAPS
30.0000 mg | ORAL_CAPSULE | Freq: Every day | ORAL | 1 refills | Status: DC
  Filled 2021-10-27 – 2021-11-06 (×2): qty 90, 90d supply, fill #0
  Filled 2022-02-05: qty 90, 90d supply, fill #1

## 2021-10-28 ENCOUNTER — Other Ambulatory Visit (HOSPITAL_COMMUNITY): Payer: Self-pay

## 2021-11-01 DIAGNOSIS — E785 Hyperlipidemia, unspecified: Secondary | ICD-10-CM | POA: Diagnosis not present

## 2021-11-01 DIAGNOSIS — E669 Obesity, unspecified: Secondary | ICD-10-CM | POA: Diagnosis not present

## 2021-11-02 ENCOUNTER — Other Ambulatory Visit (HOSPITAL_COMMUNITY): Payer: Self-pay

## 2021-11-02 MED ORDER — WEGOVY 1.7 MG/0.75ML ~~LOC~~ SOAJ: 1.7000 mg | SUBCUTANEOUS | 0 refills | Status: DC

## 2021-11-02 MED ORDER — WEGOVY 0.5 MG/0.5ML ~~LOC~~ SOAJ
0.5000 mg | SUBCUTANEOUS | 0 refills | Status: DC
Start: 2021-11-01 — End: 2022-08-01
  Filled 2021-11-02 – 2021-11-21 (×3): qty 2, 28d supply, fill #0

## 2021-11-02 MED ORDER — WEGOVY 2.4 MG/0.75ML ~~LOC~~ SOAJ
2.4000 mg | SUBCUTANEOUS | 6 refills | Status: DC
  Filled 2022-02-16 – 2022-02-22 (×3): qty 3, 28d supply, fill #0
  Filled 2022-03-16: qty 3, 28d supply, fill #1
  Filled 2022-04-13: qty 3, 28d supply, fill #2
  Filled 2022-05-12: qty 3, 28d supply, fill #3
  Filled 2022-06-04: qty 3, 28d supply, fill #4
  Filled 2022-07-06: qty 3, 28d supply, fill #5
  Filled 2022-08-08: qty 3, 28d supply, fill #6

## 2021-11-02 MED ORDER — WEGOVY 1 MG/0.5ML ~~LOC~~ SOAJ
1.0000 mg | SUBCUTANEOUS | 0 refills | Status: DC
Start: 2021-11-01 — End: 2022-01-18
  Filled 2021-12-20: qty 2, 28d supply, fill #0

## 2021-11-07 ENCOUNTER — Other Ambulatory Visit (HOSPITAL_COMMUNITY): Payer: Self-pay

## 2021-11-08 DIAGNOSIS — M533 Sacrococcygeal disorders, not elsewhere classified: Secondary | ICD-10-CM | POA: Diagnosis not present

## 2021-11-10 ENCOUNTER — Other Ambulatory Visit (HOSPITAL_COMMUNITY): Payer: Self-pay

## 2021-11-14 DIAGNOSIS — M1812 Unilateral primary osteoarthritis of first carpometacarpal joint, left hand: Secondary | ICD-10-CM | POA: Diagnosis not present

## 2021-11-21 ENCOUNTER — Other Ambulatory Visit (HOSPITAL_COMMUNITY): Payer: Self-pay

## 2021-11-22 ENCOUNTER — Other Ambulatory Visit (HOSPITAL_COMMUNITY): Payer: Self-pay

## 2021-12-20 ENCOUNTER — Other Ambulatory Visit (HOSPITAL_COMMUNITY): Payer: Self-pay

## 2022-01-06 ENCOUNTER — Other Ambulatory Visit (HOSPITAL_COMMUNITY): Payer: Self-pay

## 2022-01-06 ENCOUNTER — Other Ambulatory Visit: Payer: Self-pay | Admitting: Hematology & Oncology

## 2022-01-06 MED ORDER — VITAMIN D (ERGOCALCIFEROL) 1.25 MG (50000 UNIT) PO CAPS
50000.0000 [IU] | ORAL_CAPSULE | ORAL | 4 refills | Status: DC
  Filled 2022-01-06: qty 8, 56d supply, fill #0
  Filled 2022-02-24: qty 8, 56d supply, fill #1
  Filled 2022-04-25: qty 8, 56d supply, fill #2
  Filled 2022-06-21: qty 8, 56d supply, fill #3

## 2022-01-18 ENCOUNTER — Other Ambulatory Visit (HOSPITAL_COMMUNITY): Payer: Self-pay

## 2022-01-18 MED ORDER — WEGOVY 1.7 MG/0.75ML ~~LOC~~ SOAJ
1.7000 mg | SUBCUTANEOUS | 0 refills | Status: DC
  Filled 2022-01-18: qty 3, 28d supply, fill #0

## 2022-01-23 ENCOUNTER — Other Ambulatory Visit (HOSPITAL_COMMUNITY): Payer: Self-pay

## 2022-01-23 MED ORDER — ATORVASTATIN CALCIUM 20 MG PO TABS
20.0000 mg | ORAL_TABLET | Freq: Every day | ORAL | 2 refills | Status: DC
  Filled 2022-01-23: qty 90, 90d supply, fill #0
  Filled 2022-04-10: qty 90, 90d supply, fill #1
  Filled 2022-07-17: qty 90, 90d supply, fill #2

## 2022-01-24 ENCOUNTER — Other Ambulatory Visit (HOSPITAL_COMMUNITY): Payer: Self-pay

## 2022-01-26 ENCOUNTER — Other Ambulatory Visit (HOSPITAL_COMMUNITY): Payer: Self-pay

## 2022-01-27 ENCOUNTER — Other Ambulatory Visit (HOSPITAL_COMMUNITY): Payer: Self-pay

## 2022-01-30 ENCOUNTER — Other Ambulatory Visit (HOSPITAL_COMMUNITY): Payer: Self-pay

## 2022-01-30 DIAGNOSIS — E785 Hyperlipidemia, unspecified: Secondary | ICD-10-CM | POA: Diagnosis not present

## 2022-01-30 DIAGNOSIS — E039 Hypothyroidism, unspecified: Secondary | ICD-10-CM | POA: Diagnosis not present

## 2022-02-01 ENCOUNTER — Other Ambulatory Visit (HOSPITAL_COMMUNITY): Payer: Self-pay

## 2022-02-06 ENCOUNTER — Other Ambulatory Visit (HOSPITAL_COMMUNITY): Payer: Self-pay

## 2022-02-06 DIAGNOSIS — F329 Major depressive disorder, single episode, unspecified: Secondary | ICD-10-CM | POA: Diagnosis not present

## 2022-02-06 DIAGNOSIS — D638 Anemia in other chronic diseases classified elsewhere: Secondary | ICD-10-CM | POA: Diagnosis not present

## 2022-02-06 DIAGNOSIS — R82998 Other abnormal findings in urine: Secondary | ICD-10-CM | POA: Diagnosis not present

## 2022-02-06 DIAGNOSIS — Z0000 Encounter for general adult medical examination without abnormal findings: Secondary | ICD-10-CM | POA: Diagnosis not present

## 2022-02-06 DIAGNOSIS — Z1331 Encounter for screening for depression: Secondary | ICD-10-CM | POA: Diagnosis not present

## 2022-02-06 DIAGNOSIS — E039 Hypothyroidism, unspecified: Secondary | ICD-10-CM | POA: Diagnosis not present

## 2022-02-06 DIAGNOSIS — E669 Obesity, unspecified: Secondary | ICD-10-CM | POA: Diagnosis not present

## 2022-02-06 DIAGNOSIS — E785 Hyperlipidemia, unspecified: Secondary | ICD-10-CM | POA: Diagnosis not present

## 2022-02-06 MED ORDER — LEVOTHYROXINE SODIUM 125 MCG PO TABS
125.0000 ug | ORAL_TABLET | Freq: Every morning | ORAL | 1 refills | Status: DC
  Filled 2022-02-06: qty 90, 90d supply, fill #0
  Filled 2022-04-25: qty 90, 90d supply, fill #1

## 2022-02-09 ENCOUNTER — Other Ambulatory Visit: Payer: Self-pay | Admitting: Internal Medicine

## 2022-02-09 DIAGNOSIS — Z1231 Encounter for screening mammogram for malignant neoplasm of breast: Secondary | ICD-10-CM

## 2022-02-14 ENCOUNTER — Other Ambulatory Visit (HOSPITAL_COMMUNITY): Payer: Self-pay

## 2022-02-14 MED ORDER — BUPROPION HCL ER (XL) 150 MG PO TB24
150.0000 mg | ORAL_TABLET | Freq: Every day | ORAL | 2 refills | Status: DC
  Filled 2022-02-14: qty 90, 90d supply, fill #0
  Filled 2022-05-12: qty 90, 90d supply, fill #1
  Filled 2022-07-06 – 2022-08-08 (×2): qty 90, 90d supply, fill #2

## 2022-02-15 ENCOUNTER — Other Ambulatory Visit (HOSPITAL_COMMUNITY): Payer: Self-pay

## 2022-02-15 ENCOUNTER — Encounter: Payer: Self-pay | Admitting: Family

## 2022-02-15 MED ORDER — WEGOVY 1.7 MG/0.75ML ~~LOC~~ SOAJ
SUBCUTANEOUS | 1 refills | Status: DC
  Filled 2022-02-15 – 2022-02-16 (×2): qty 2, 28d supply, fill #0

## 2022-02-16 ENCOUNTER — Other Ambulatory Visit (HOSPITAL_COMMUNITY): Payer: Self-pay

## 2022-02-16 ENCOUNTER — Ambulatory Visit
Admission: RE | Admit: 2022-02-16 | Discharge: 2022-02-16 | Disposition: A | Discharge status: Home / Self Care | Payer: 59 | Source: Ambulatory Visit | Attending: Internal Medicine | Admitting: Internal Medicine

## 2022-02-16 DIAGNOSIS — Z1231 Encounter for screening mammogram for malignant neoplasm of breast: Secondary | ICD-10-CM

## 2022-02-22 ENCOUNTER — Other Ambulatory Visit (HOSPITAL_COMMUNITY): Payer: Self-pay

## 2022-02-22 DIAGNOSIS — M19072 Primary osteoarthritis, left ankle and foot: Secondary | ICD-10-CM | POA: Diagnosis not present

## 2022-02-23 ENCOUNTER — Encounter: Payer: Self-pay | Admitting: Family

## 2022-02-23 ENCOUNTER — Other Ambulatory Visit (HOSPITAL_COMMUNITY): Payer: Self-pay

## 2022-02-24 ENCOUNTER — Other Ambulatory Visit (HOSPITAL_COMMUNITY): Payer: Self-pay

## 2022-03-10 ENCOUNTER — Other Ambulatory Visit (HOSPITAL_COMMUNITY): Payer: Self-pay

## 2022-03-10 DIAGNOSIS — M19072 Primary osteoarthritis, left ankle and foot: Secondary | ICD-10-CM | POA: Diagnosis not present

## 2022-03-10 MED ORDER — MELOXICAM 7.5 MG PO TABS
7.5000 mg | ORAL_TABLET | Freq: Two times a day (BID) | ORAL | 1 refills | Status: DC | PRN
  Filled 2022-03-10: qty 60, 30d supply, fill #0
  Filled 2022-05-15: qty 60, 30d supply, fill #1

## 2022-03-16 ENCOUNTER — Other Ambulatory Visit (HOSPITAL_COMMUNITY): Payer: Self-pay

## 2022-03-21 DIAGNOSIS — E669 Obesity, unspecified: Secondary | ICD-10-CM | POA: Diagnosis not present

## 2022-04-02 ENCOUNTER — Other Ambulatory Visit (HOSPITAL_COMMUNITY): Payer: Self-pay

## 2022-04-03 ENCOUNTER — Other Ambulatory Visit (HOSPITAL_COMMUNITY): Payer: Self-pay

## 2022-04-04 ENCOUNTER — Other Ambulatory Visit (HOSPITAL_COMMUNITY): Payer: Self-pay

## 2022-04-04 MED ORDER — ESCITALOPRAM OXALATE 10 MG PO TABS
10.0000 mg | ORAL_TABLET | Freq: Every day | ORAL | 2 refills | Status: DC
  Filled 2022-04-04: qty 90, 90d supply, fill #0
  Filled 2022-06-21: qty 90, 90d supply, fill #1
  Filled 2022-09-26: qty 90, 90d supply, fill #2

## 2022-04-10 ENCOUNTER — Other Ambulatory Visit (HOSPITAL_COMMUNITY): Payer: Self-pay

## 2022-04-12 DIAGNOSIS — Z961 Presence of intraocular lens: Secondary | ICD-10-CM | POA: Diagnosis not present

## 2022-04-12 DIAGNOSIS — H52203 Unspecified astigmatism, bilateral: Secondary | ICD-10-CM | POA: Diagnosis not present

## 2022-04-12 DIAGNOSIS — H524 Presbyopia: Secondary | ICD-10-CM | POA: Diagnosis not present

## 2022-04-13 ENCOUNTER — Other Ambulatory Visit (HOSPITAL_COMMUNITY): Payer: Self-pay

## 2022-04-25 ENCOUNTER — Other Ambulatory Visit (HOSPITAL_COMMUNITY): Payer: Self-pay

## 2022-04-26 ENCOUNTER — Encounter (INDEPENDENT_AMBULATORY_CARE_PROVIDER_SITE_OTHER): Payer: 59 | Admitting: Ophthalmology

## 2022-05-12 ENCOUNTER — Other Ambulatory Visit (HOSPITAL_COMMUNITY): Payer: Self-pay

## 2022-05-15 ENCOUNTER — Other Ambulatory Visit (HOSPITAL_COMMUNITY): Payer: Self-pay

## 2022-05-15 MED ORDER — TEMAZEPAM 30 MG PO CAPS
30.0000 mg | ORAL_CAPSULE | Freq: Every evening | ORAL | 1 refills | Status: DC
  Filled 2022-05-15: qty 90, 90d supply, fill #0

## 2022-05-22 ENCOUNTER — Other Ambulatory Visit (HOSPITAL_COMMUNITY): Payer: Self-pay

## 2022-05-22 MED ORDER — NYSTATIN-TRIAMCINOLONE 100000-0.1 UNIT/GM-% EX CREA
1.0000 | TOPICAL_CREAM | Freq: Every evening | CUTANEOUS | 1 refills | Status: DC | PRN
  Filled 2022-05-22: qty 30, 30d supply, fill #0
  Filled 2022-07-06: qty 30, 30d supply, fill #1

## 2022-05-22 MED ORDER — NYSTATIN 100000 UNIT/GM EX POWD
1.0000 | Freq: Two times a day (BID) | CUTANEOUS | 1 refills | Status: AC
  Filled 2022-05-22: qty 15, 30d supply, fill #0

## 2022-06-05 ENCOUNTER — Other Ambulatory Visit (HOSPITAL_COMMUNITY): Payer: Self-pay

## 2022-06-07 DIAGNOSIS — M1812 Unilateral primary osteoarthritis of first carpometacarpal joint, left hand: Secondary | ICD-10-CM | POA: Diagnosis not present

## 2022-06-21 ENCOUNTER — Other Ambulatory Visit (HOSPITAL_COMMUNITY): Payer: Self-pay

## 2022-06-30 DIAGNOSIS — M79672 Pain in left foot: Secondary | ICD-10-CM | POA: Diagnosis not present

## 2022-07-06 ENCOUNTER — Other Ambulatory Visit (HOSPITAL_COMMUNITY): Payer: Self-pay

## 2022-07-06 MED ORDER — MELOXICAM 7.5 MG PO TABS
7.5000 mg | ORAL_TABLET | Freq: Two times a day (BID) | ORAL | 1 refills | Status: DC | PRN
  Filled 2022-07-06: qty 60, 30d supply, fill #0

## 2022-07-07 ENCOUNTER — Other Ambulatory Visit (HOSPITAL_COMMUNITY): Payer: Self-pay

## 2022-07-10 DIAGNOSIS — M19072 Primary osteoarthritis, left ankle and foot: Secondary | ICD-10-CM | POA: Diagnosis not present

## 2022-07-17 ENCOUNTER — Other Ambulatory Visit (HOSPITAL_COMMUNITY): Payer: Self-pay

## 2022-07-18 ENCOUNTER — Other Ambulatory Visit (HOSPITAL_COMMUNITY): Payer: Self-pay

## 2022-07-18 MED ORDER — LEVOTHYROXINE SODIUM 125 MCG PO TABS
125.0000 ug | ORAL_TABLET | Freq: Every morning | ORAL | 1 refills | Status: DC
  Filled 2022-07-18: qty 90, 90d supply, fill #0
  Filled 2022-11-05: qty 90, 90d supply, fill #1

## 2022-07-31 DIAGNOSIS — M66379 Spontaneous rupture of flexor tendons, unspecified ankle and foot: Secondary | ICD-10-CM | POA: Diagnosis not present

## 2022-07-31 DIAGNOSIS — M19072 Primary osteoarthritis, left ankle and foot: Secondary | ICD-10-CM | POA: Diagnosis not present

## 2022-08-01 ENCOUNTER — Encounter (HOSPITAL_BASED_OUTPATIENT_CLINIC_OR_DEPARTMENT_OTHER): Payer: Self-pay | Admitting: Orthopedic Surgery

## 2022-08-01 ENCOUNTER — Other Ambulatory Visit (HOSPITAL_COMMUNITY): Payer: Self-pay | Admitting: Orthopedic Surgery

## 2022-08-02 ENCOUNTER — Other Ambulatory Visit: Payer: Self-pay

## 2022-08-02 NOTE — Progress Notes (Signed)
   08/02/22 0936  PAT Phone Screen  Do You Have Diabetes? No  Do You Have Hypertension? No  Have You Ever Been to the ER for Asthma? No  Have You Taken Oral Steroids in the Past 3 Months? No  Do you Take Phenteramine or any Other Diet Drugs? (S)  Yes (on wegovy weekly, will stop 7d prior to sx)  Recent  Lab Work, EKG, CXR? No  Do you have a history of heart problems? No  Have You Ever Had Tests on Your Heart? No  Any Recent Hospitalizations? No  Height 5\' 3"  (1.6 m)  Weight 101.2 kg  Pat Appointment Scheduled No  Reason for No Appointment Not Needed

## 2022-08-08 ENCOUNTER — Other Ambulatory Visit (HOSPITAL_COMMUNITY): Payer: Self-pay

## 2022-08-08 DIAGNOSIS — M66379 Spontaneous rupture of flexor tendons, unspecified ankle and foot: Secondary | ICD-10-CM | POA: Diagnosis not present

## 2022-08-08 DIAGNOSIS — M19072 Primary osteoarthritis, left ankle and foot: Secondary | ICD-10-CM | POA: Diagnosis not present

## 2022-08-14 ENCOUNTER — Other Ambulatory Visit: Payer: Self-pay | Admitting: Hematology & Oncology

## 2022-08-14 ENCOUNTER — Other Ambulatory Visit (HOSPITAL_COMMUNITY): Payer: Self-pay

## 2022-08-14 MED ORDER — TEMAZEPAM 30 MG PO CAPS
30.0000 mg | ORAL_CAPSULE | Freq: Every day | ORAL | 0 refills | Status: DC
  Filled 2022-08-14: qty 90, 90d supply, fill #0

## 2022-08-14 MED ORDER — VITAMIN D (ERGOCALCIFEROL) 1.25 MG (50000 UNIT) PO CAPS
50000.0000 [IU] | ORAL_CAPSULE | ORAL | 4 refills | Status: DC
  Filled 2022-08-14: qty 8, 56d supply, fill #0
  Filled 2022-10-10: qty 8, 56d supply, fill #1
  Filled 2022-12-04: qty 8, 56d supply, fill #2
  Filled 2023-01-29: qty 8, 56d supply, fill #3
  Filled 2023-04-03: qty 8, 56d supply, fill #4

## 2022-08-16 ENCOUNTER — Other Ambulatory Visit (HOSPITAL_COMMUNITY): Payer: Self-pay

## 2022-08-16 DIAGNOSIS — F331 Major depressive disorder, recurrent, moderate: Secondary | ICD-10-CM | POA: Diagnosis not present

## 2022-08-16 DIAGNOSIS — E669 Obesity, unspecified: Secondary | ICD-10-CM | POA: Diagnosis not present

## 2022-08-16 DIAGNOSIS — E785 Hyperlipidemia, unspecified: Secondary | ICD-10-CM | POA: Diagnosis not present

## 2022-08-30 NOTE — Anesthesia Preprocedure Evaluation (Signed)
Anesthesia Evaluation  Patient identified by MRN, date of birth, ID band Patient awake    Reviewed: Allergy & Precautions, H&P , NPO status , Patient's Chart, lab work & pertinent test results  Airway Mallampati: II  TM Distance: >3 FB Neck ROM: Full    Dental no notable dental hx. (+) Teeth Intact, Dental Advisory Given   Pulmonary neg pulmonary ROS, former smoker   Pulmonary exam normal breath sounds clear to auscultation       Cardiovascular Exercise Tolerance: Good negative cardio ROS  Rhythm:Regular Rate:Normal     Neuro/Psych   Anxiety Depression    negative neurological ROS     GI/Hepatic negative GI ROS, Neg liver ROS,,,  Endo/Other  Hypothyroidism  Morbid obesity  Renal/GU negative Renal ROS  negative genitourinary   Musculoskeletal  (+) Arthritis , Osteoarthritis,    Abdominal   Peds  Hematology  (+) Blood dyscrasia, anemia   Anesthesia Other Findings   Reproductive/Obstetrics negative OB ROS                             Anesthesia Physical Anesthesia Plan  ASA: 2  Anesthesia Plan: General   Post-op Pain Management: Regional block*, Toradol IV (intra-op)* and Tylenol PO (pre-op)*   Induction: Intravenous  PONV Risk Score and Plan: 3 and Ondansetron, Dexamethasone, Propofol infusion, TIVA and Midazolam  Airway Management Planned: LMA  Additional Equipment:   Intra-op Plan:   Post-operative Plan: Extubation in OR  Informed Consent: I have reviewed the patients History and Physical, chart, labs and discussed the procedure including the risks, benefits and alternatives for the proposed anesthesia with the patient or authorized representative who has indicated his/her understanding and acceptance.     Dental advisory given  Plan Discussed with: CRNA  Anesthesia Plan Comments:        Anesthesia Quick Evaluation

## 2022-08-31 ENCOUNTER — Other Ambulatory Visit (HOSPITAL_COMMUNITY): Payer: Self-pay

## 2022-08-31 ENCOUNTER — Ambulatory Visit (HOSPITAL_BASED_OUTPATIENT_CLINIC_OR_DEPARTMENT_OTHER): Payer: 59

## 2022-08-31 ENCOUNTER — Encounter (HOSPITAL_BASED_OUTPATIENT_CLINIC_OR_DEPARTMENT_OTHER): Payer: Self-pay | Admitting: Orthopedic Surgery

## 2022-08-31 ENCOUNTER — Other Ambulatory Visit: Payer: Self-pay

## 2022-08-31 ENCOUNTER — Encounter (HOSPITAL_BASED_OUTPATIENT_CLINIC_OR_DEPARTMENT_OTHER)
Admission: RE | Disposition: A | Discharge status: Home / Self Care | Payer: Self-pay | Source: Home / Self Care | Attending: Orthopedic Surgery

## 2022-08-31 ENCOUNTER — Ambulatory Visit (HOSPITAL_BASED_OUTPATIENT_CLINIC_OR_DEPARTMENT_OTHER): Payer: 59 | Admitting: Certified Registered"

## 2022-08-31 ENCOUNTER — Ambulatory Visit (HOSPITAL_BASED_OUTPATIENT_CLINIC_OR_DEPARTMENT_OTHER)
Admission: RE | Admit: 2022-08-31 | Discharge: 2022-08-31 | Disposition: A | Discharge status: Home / Self Care | Payer: 59 | Attending: Orthopedic Surgery | Admitting: Orthopedic Surgery

## 2022-08-31 DIAGNOSIS — M659 Synovitis and tenosynovitis, unspecified: Secondary | ICD-10-CM | POA: Insufficient documentation

## 2022-08-31 DIAGNOSIS — M19072 Primary osteoarthritis, left ankle and foot: Secondary | ICD-10-CM

## 2022-08-31 DIAGNOSIS — Z01818 Encounter for other preprocedural examination: Secondary | ICD-10-CM

## 2022-08-31 DIAGNOSIS — E039 Hypothyroidism, unspecified: Secondary | ICD-10-CM | POA: Diagnosis not present

## 2022-08-31 DIAGNOSIS — M7672 Peroneal tendinitis, left leg: Secondary | ICD-10-CM | POA: Diagnosis not present

## 2022-08-31 DIAGNOSIS — Z6838 Body mass index (BMI) 38.0-38.9, adult: Secondary | ICD-10-CM | POA: Diagnosis not present

## 2022-08-31 DIAGNOSIS — X58XXXA Exposure to other specified factors, initial encounter: Secondary | ICD-10-CM | POA: Insufficient documentation

## 2022-08-31 DIAGNOSIS — S86112A Strain of other muscle(s) and tendon(s) of posterior muscle group at lower leg level, left leg, initial encounter: Secondary | ICD-10-CM | POA: Diagnosis not present

## 2022-08-31 DIAGNOSIS — M89372 Hypertrophy of bone, left ankle and foot: Secondary | ICD-10-CM | POA: Diagnosis not present

## 2022-08-31 DIAGNOSIS — Z87891 Personal history of nicotine dependence: Secondary | ICD-10-CM | POA: Diagnosis not present

## 2022-08-31 DIAGNOSIS — S86312A Strain of muscle(s) and tendon(s) of peroneal muscle group at lower leg level, left leg, initial encounter: Secondary | ICD-10-CM

## 2022-08-31 DIAGNOSIS — G8918 Other acute postprocedural pain: Secondary | ICD-10-CM | POA: Diagnosis not present

## 2022-08-31 HISTORY — PX: FOOT ARTHRODESIS: SHX1655

## 2022-08-31 HISTORY — PX: REPAIR OF PERONEUS BREVIS TENDON: SHX6215

## 2022-08-31 SURGERY — REPAIR, TENDON, PERONEUS BREVIS
Anesthesia: General | Site: Foot | Laterality: Left

## 2022-08-31 MED ORDER — DEXMEDETOMIDINE HCL IN NACL 80 MCG/20ML IV SOLN
INTRAVENOUS | Status: DC | PRN
  Administered 2022-08-31: 8 ug via BUCCAL

## 2022-08-31 MED ORDER — DEXAMETHASONE SODIUM PHOSPHATE 10 MG/ML IJ SOLN
INTRAMUSCULAR | Status: DC | PRN
  Administered 2022-08-31: 4 mg via INTRAVENOUS

## 2022-08-31 MED ORDER — FENTANYL CITRATE (PF) 100 MCG/2ML IJ SOLN
INTRAMUSCULAR | Status: AC
  Filled 2022-08-31: qty 2

## 2022-08-31 MED ORDER — BUPIVACAINE-EPINEPHRINE (PF) 0.5% -1:200000 IJ SOLN
INTRAMUSCULAR | Status: DC | PRN
  Administered 2022-08-31: 30 mL via PERINEURAL
  Administered 2022-08-31: 10 mL via PERINEURAL

## 2022-08-31 MED ORDER — SENNA 8.6 MG PO TABS
2.0000 | ORAL_TABLET | Freq: Two times a day (BID) | ORAL | 0 refills | Status: DC
  Filled 2022-08-31: qty 30, 8d supply, fill #0

## 2022-08-31 MED ORDER — OXYCODONE HCL 5 MG PO TABS
5.0000 mg | ORAL_TABLET | ORAL | 0 refills | Status: AC | PRN
  Filled 2022-08-31: qty 20, 4d supply, fill #0

## 2022-08-31 MED ORDER — MIDAZOLAM HCL 2 MG/2ML IJ SOLN
INTRAMUSCULAR | Status: AC
  Filled 2022-08-31: qty 2

## 2022-08-31 MED ORDER — HEMOSTATIC AGENTS (NO CHARGE) OPTIME
TOPICAL | Status: DC | PRN
  Administered 2022-08-31: 1 via TOPICAL

## 2022-08-31 MED ORDER — HYDROMORPHONE HCL 1 MG/ML IJ SOLN
0.2500 mg | INTRAMUSCULAR | Status: DC | PRN
  Administered 2022-08-31 (×2): 0.25 mg via INTRAVENOUS

## 2022-08-31 MED ORDER — CEFAZOLIN SODIUM-DEXTROSE 2-4 GM/100ML-% IV SOLN
2.0000 g | INTRAVENOUS | Status: AC
  Administered 2022-08-31: 2 g via INTRAVENOUS

## 2022-08-31 MED ORDER — ASPIRIN 81 MG PO TBEC
81.0000 mg | DELAYED_RELEASE_TABLET | Freq: Two times a day (BID) | ORAL | 0 refills | Status: DC
  Filled 2022-08-31: qty 84, 42d supply, fill #0

## 2022-08-31 MED ORDER — 0.9 % SODIUM CHLORIDE (POUR BTL) OPTIME
TOPICAL | Status: DC | PRN
  Administered 2022-08-31: 200 mL

## 2022-08-31 MED ORDER — SODIUM CHLORIDE 0.9 % IV SOLN: INTRAVENOUS | Status: DC

## 2022-08-31 MED ORDER — LIDOCAINE HCL (CARDIAC) PF 100 MG/5ML IV SOSY
PREFILLED_SYRINGE | INTRAVENOUS | Status: DC | PRN
  Administered 2022-08-31: 30 mg via INTRAVENOUS

## 2022-08-31 MED ORDER — PROPOFOL 500 MG/50ML IV EMUL
INTRAVENOUS | Status: DC | PRN
  Administered 2022-08-31: 150 ug/kg/min via INTRAVENOUS

## 2022-08-31 MED ORDER — MIDAZOLAM HCL 2 MG/2ML IJ SOLN
2.0000 mg | Freq: Once | INTRAMUSCULAR | Status: AC
  Administered 2022-08-31: 2 mg via INTRAVENOUS

## 2022-08-31 MED ORDER — FENTANYL CITRATE (PF) 100 MCG/2ML IJ SOLN
100.0000 ug | Freq: Once | INTRAMUSCULAR | Status: AC
  Administered 2022-08-31: 100 ug via INTRAVENOUS

## 2022-08-31 MED ORDER — PROPOFOL 10 MG/ML IV BOLUS
INTRAVENOUS | Status: DC | PRN
  Administered 2022-08-31: 150 mg via INTRAVENOUS

## 2022-08-31 MED ORDER — ONDANSETRON HCL 4 MG/2ML IJ SOLN
INTRAMUSCULAR | Status: DC | PRN
  Administered 2022-08-31: 4 mg via INTRAVENOUS

## 2022-08-31 MED ORDER — ACETAMINOPHEN 500 MG PO TABS
ORAL_TABLET | ORAL | Status: AC
  Filled 2022-08-31: qty 2

## 2022-08-31 MED ORDER — ACETAMINOPHEN 500 MG PO TABS
1000.0000 mg | ORAL_TABLET | Freq: Once | ORAL | Status: AC
  Administered 2022-08-31: 1000 mg via ORAL

## 2022-08-31 MED ORDER — LACTATED RINGERS IV SOLN: INTRAVENOUS | Status: DC

## 2022-08-31 MED ORDER — CEFAZOLIN SODIUM-DEXTROSE 2-4 GM/100ML-% IV SOLN
INTRAVENOUS | Status: AC
  Filled 2022-08-31: qty 100

## 2022-08-31 MED ORDER — DOCUSATE SODIUM 100 MG PO CAPS
100.0000 mg | ORAL_CAPSULE | Freq: Two times a day (BID) | ORAL | 0 refills | Status: AC
  Filled 2022-08-31: qty 30, 15d supply, fill #0

## 2022-08-31 MED ORDER — HYDROMORPHONE HCL 1 MG/ML IJ SOLN
INTRAMUSCULAR | Status: AC
  Filled 2022-08-31: qty 0.5

## 2022-08-31 MED ORDER — VANCOMYCIN HCL 500 MG IV SOLR
INTRAVENOUS | Status: DC | PRN
  Administered 2022-08-31: 500 mg via TOPICAL

## 2022-08-31 MED ORDER — KETOROLAC TROMETHAMINE 15 MG/ML IJ SOLN
INTRAMUSCULAR | Status: DC | PRN
  Administered 2022-08-31: 30 mg via INTRAVENOUS

## 2022-08-31 SURGICAL SUPPLY — 90 items
APL PRP STRL LF DISP 70% ISPRP (MISCELLANEOUS) ×1
BANDAGE ESMARK 6X9 LF (GAUZE/BANDAGES/DRESSINGS) IMPLANT
BIT DRILL 2.9 CANN QC NONSTRL (BIT) IMPLANT
BIT DRILL CAL 2.5 ST W/SLV (BIT) IMPLANT
BIT TREPHINE CORING 8 (BIT) IMPLANT
BLADE AVERAGE 25X9 (BLADE) IMPLANT
BLADE MICRO SAGITTAL (BLADE) IMPLANT
BLADE MINI RND TIP GREEN BEAV (BLADE) IMPLANT
BLADE SURG 15 STRL LF DISP TIS (BLADE) ×3 IMPLANT
BLADE SURG 15 STRL SS (BLADE) ×3
BNDG CMPR 9X4 STRL LF SNTH (GAUZE/BANDAGES/DRESSINGS)
BNDG CMPR 9X6 STRL LF SNTH (GAUZE/BANDAGES/DRESSINGS)
BNDG ELASTIC 4X5.8 VLCR STR LF (GAUZE/BANDAGES/DRESSINGS) ×1 IMPLANT
BNDG ELASTIC 6X5.8 VLCR STR LF (GAUZE/BANDAGES/DRESSINGS) ×1 IMPLANT
BNDG ESMARK 4X9 LF (GAUZE/BANDAGES/DRESSINGS) IMPLANT
BNDG ESMARK 6X9 LF (GAUZE/BANDAGES/DRESSINGS)
CHLORAPREP W/TINT 26 (MISCELLANEOUS) ×1 IMPLANT
COVER BACK TABLE 60X90IN (DRAPES) ×1 IMPLANT
CUFF TOURN SGL QUICK 34 (TOURNIQUET CUFF)
CUFF TRNQT CYL 34X4.125X (TOURNIQUET CUFF) IMPLANT
DRAPE EXTREMITY T 121X128X90 (DISPOSABLE) ×1 IMPLANT
DRAPE OEC MINIVIEW 54X84 (DRAPES) ×1 IMPLANT
DRAPE U-SHAPE 47X51 STRL (DRAPES) ×1 IMPLANT
DRSG MEPITEL 4X7.2 (GAUZE/BANDAGES/DRESSINGS) ×1 IMPLANT
ELECT REM PT RETURN 9FT ADLT (ELECTROSURGICAL) ×1
ELECTRODE REM PT RTRN 9FT ADLT (ELECTROSURGICAL) ×1 IMPLANT
GAUZE PAD ABD 8X10 STRL (GAUZE/BANDAGES/DRESSINGS) ×2 IMPLANT
GAUZE SPONGE 4X4 12PLY STRL (GAUZE/BANDAGES/DRESSINGS) ×1 IMPLANT
GLOVE BIO SURGEON STRL SZ8 (GLOVE) ×1 IMPLANT
GLOVE BIOGEL PI IND STRL 7.0 (GLOVE) IMPLANT
GLOVE BIOGEL PI IND STRL 8 (GLOVE) ×2 IMPLANT
GLOVE ECLIPSE 8.0 STRL XLNG CF (GLOVE) ×1 IMPLANT
GLOVE SURG SS PI 6.5 STRL IVOR (GLOVE) IMPLANT
GOWN STRL REUS W/ TWL LRG LVL3 (GOWN DISPOSABLE) ×1 IMPLANT
GOWN STRL REUS W/ TWL XL LVL3 (GOWN DISPOSABLE) ×2 IMPLANT
GOWN STRL REUS W/TWL LRG LVL3 (GOWN DISPOSABLE) ×1
GOWN STRL REUS W/TWL XL LVL3 (GOWN DISPOSABLE) ×2
K-WIRE DBL .062X4 NSTRL (WIRE)
KIT STRATUM INSTRUMENT STD (KITS) IMPLANT
KWIRE .062X9 SMTH SNG TROCAR (MISCELLANEOUS) IMPLANT
KWIRE DBL .062X4 NSTRL (WIRE) IMPLANT
LOOP VESSEL MAXI 1X406 BLUE (MISCELLANEOUS)
LOOP VESSEL MAXI BLUE (MISCELLANEOUS) IMPLANT
LOOP VESSEL MINI RED (MISCELLANEOUS) IMPLANT
NDL SAFETY ECLIP 18X1.5 (MISCELLANEOUS) IMPLANT
NDL SUT 6 .5 CRC .975X.05 MAYO (NEEDLE) IMPLANT
NEEDLE HYPO 22GX1.5 SAFETY (NEEDLE) IMPLANT
NEEDLE MAYO TAPER (NEEDLE)
NS IRRIG 1000ML POUR BTL (IV SOLUTION) ×1 IMPLANT
PACK BASIN DAY SURGERY FS (CUSTOM PROCEDURE TRAY) ×1 IMPLANT
PAD CAST 4YDX4 CTTN HI CHSV (CAST SUPPLIES) ×1 IMPLANT
PADDING CAST ABS COTTON 4X4 ST (CAST SUPPLIES) IMPLANT
PADDING CAST COTTON 4X4 STRL (CAST SUPPLIES) ×1
PADDING CAST COTTON 6X4 STRL (CAST SUPPLIES) ×1 IMPLANT
PASSER SUT SWANSON 36MM LOOP (INSTRUMENTS) IMPLANT
PENCIL SMOKE EVACUATOR (MISCELLANEOUS) ×1 IMPLANT
PLATE CC STRM LG LT (Plate) IMPLANT
RETRIEVER SUT HEWSON (MISCELLANEOUS) IMPLANT
SANITIZER HAND PURELL FF 515ML (MISCELLANEOUS) ×1 IMPLANT
SCREW ACE CAN 4.0 50M (Screw) IMPLANT
SCREW LOCK ST STRM 3.5X26 (Screw) IMPLANT
SCREW LOCK ST STRM 3.5X36 (Screw) IMPLANT
SCREW LOCK STRM 3.5X34 ST (Screw) IMPLANT
SCREW LP STRM NL 3.5X26 ST (Screw) IMPLANT
SHEET MEDIUM DRAPE 40X70 STRL (DRAPES) ×1 IMPLANT
SLEEVE SCD COMPRESS KNEE MED (STOCKING) ×1 IMPLANT
SPIKE FLUID TRANSFER (MISCELLANEOUS) IMPLANT
SPLINT PLASTER CAST FAST 5X30 (CAST SUPPLIES) ×20 IMPLANT
SPONGE SURGIFOAM ABS GEL 12-7 (HEMOSTASIS) IMPLANT
SPONGE T-LAP 18X18 ~~LOC~~+RFID (SPONGE) ×1 IMPLANT
STOCKINETTE 6  STRL (DRAPES) ×1
STOCKINETTE 6 STRL (DRAPES) ×1 IMPLANT
SUCTION FRAZIER HANDLE 10FR (MISCELLANEOUS) ×1
SUCTION TUBE FRAZIER 10FR DISP (MISCELLANEOUS) ×1 IMPLANT
SUT ETHIBOND 0 MO6 C/R (SUTURE) IMPLANT
SUT ETHIBOND 2 OS 4 DA (SUTURE) IMPLANT
SUT ETHILON 3 0 PS 1 (SUTURE) ×1 IMPLANT
SUT FIBERWIRE 2-0 18 17.9 3/8 (SUTURE)
SUT MERSILENE 2.0 SH NDLE (SUTURE) IMPLANT
SUT MNCRL AB 3-0 PS2 18 (SUTURE) ×1 IMPLANT
SUT VIC AB 2-0 SH 27 (SUTURE) ×1
SUT VIC AB 2-0 SH 27XBRD (SUTURE) ×1 IMPLANT
SUT VICRYL 0 SH 27 (SUTURE) IMPLANT
SUT VICRYL 0 UR6 27IN ABS (SUTURE) IMPLANT
SUTURE FIBERWR 2-0 18 17.9 3/8 (SUTURE) IMPLANT
SYR BULB EAR ULCER 3OZ GRN STR (SYRINGE) ×1 IMPLANT
SYR CONTROL 10ML LL (SYRINGE) IMPLANT
TOWEL GREEN STERILE FF (TOWEL DISPOSABLE) ×2 IMPLANT
TUBE CONNECTING 20X1/4 (TUBING) ×1 IMPLANT
UNDERPAD 30X36 HEAVY ABSORB (UNDERPADS AND DIAPERS) ×1 IMPLANT

## 2022-08-31 NOTE — Anesthesia Procedure Notes (Signed)
Anesthesia Regional Block: Popliteal block   Pre-Anesthetic Checklist: , timeout performed,  Correct Patient, Correct Site, Correct Laterality,  Correct Procedure, Correct Position, site marked,  Risks and benefits discussed,  Pre-op evaluation,  At surgeon's request and post-op pain management  Laterality: Left  Prep: Maximum Sterile Barrier Precautions used, chloraprep       Needles:  Injection technique: Single-shot  Needle Type: Echogenic Stimulator Needle     Needle Length: 9cm  Needle Gauge: 21     Additional Needles:   Procedures:,,,, ultrasound used (permanent image in chart),,    Narrative:  Start time: 08/31/2022 7:40 AM End time: 08/31/2022 7:50 AM Injection made incrementally with aspirations every 5 mL.  Performed by: Personally  Anesthesiologist: Gaynelle Adu, MD  Additional Notes:  Saphenous block at the ankle with 10cc of 0.5% Bupivicaine with 1:200k epi. Adductor canal aborted due to a prominant vein where the nerve should be.

## 2022-08-31 NOTE — Transfer of Care (Signed)
Immediate Anesthesia Transfer of Care Note  Patient: Brooke French  Procedure(s) Performed: PERONEUS BREVIS TENDON REPAIR AND PERONEUS LONGUS TENDON DEBRIDEMENT (Left: Foot) ARTHRODESIS CALCANEOCUBOID JOINT;  CALCANEUS AUTOGRAFT (Left: Foot)  Patient Location: PACU  Anesthesia Type:GA combined with regional for post-op pain  Level of Consciousness: awake, alert , oriented, and drowsy  Airway & Oxygen Therapy: Patient Spontanous Breathing and Patient connected to face mask oxygen  Post-op Assessment: Report given to RN and Post -op Vital signs reviewed and stable  Post vital signs: Reviewed and stable  Last Vitals:  Vitals Value Taken Time  BP    Temp    Pulse 76 08/31/22 1044  Resp 18 08/31/22 1044  SpO2 96 % 08/31/22 1044  Vitals shown include unvalidated device data.  Last Pain:  Vitals:   08/31/22 0635  TempSrc: Oral  PainSc: 0-No pain         Complications: No notable events documented.

## 2022-08-31 NOTE — Anesthesia Postprocedure Evaluation (Signed)
Anesthesia Post Note  Patient: Brooke French  Procedure(s) Performed: PERONEUS BREVIS TENDON REPAIR AND PERONEUS LONGUS TENDON DEBRIDEMENT (Left: Foot) ARTHRODESIS CALCANEOCUBOID JOINT;  CALCANEUS AUTOGRAFT (Left: Foot)     Patient location during evaluation: PACU Anesthesia Type: General and Regional Level of consciousness: awake and alert Pain management: pain level controlled Vital Signs Assessment: post-procedure vital signs reviewed and stable Respiratory status: spontaneous breathing, nonlabored ventilation and respiratory function stable Cardiovascular status: blood pressure returned to baseline and stable Postop Assessment: no apparent nausea or vomiting Anesthetic complications: no  No notable events documented.  Last Vitals:  Vitals:   08/31/22 1145 08/31/22 1200  BP: (!) 99/58   Pulse: 70 72  Resp: 16 (!) 23  Temp:    SpO2: 99% 98%    Last Pain:  Vitals:   08/31/22 1145  TempSrc:   PainSc: 1                  Hermila Millis,W. EDMOND

## 2022-08-31 NOTE — Anesthesia Procedure Notes (Signed)
Procedure Name: LMA Insertion Date/Time: 08/31/2022 8:49 AM  Performed by: Elizabelle Fite, Jewel Baize, CRNAPre-anesthesia Checklist: Patient identified, Emergency Drugs available, Suction available and Patient being monitored Patient Re-evaluated:Patient Re-evaluated prior to induction Oxygen Delivery Method: Circle system utilized Preoxygenation: Pre-oxygenation with 100% oxygen Induction Type: IV induction Ventilation: Mask ventilation without difficulty LMA: LMA inserted LMA Size: 4.0 Number of attempts: 1 Airway Equipment and Method: Bite block Placement Confirmation: positive ETCO2 Tube secured with: Tape Dental Injury: Teeth and Oropharynx as per pre-operative assessment

## 2022-08-31 NOTE — Progress Notes (Signed)
Assisted Dr. Autumn Patty with left, popliteal, ultrasound guided supplemental block. Side rails up, monitors on throughout procedure. See vital signs in flow sheet. Tolerated Procedure well.

## 2022-08-31 NOTE — H&P (Signed)
Brooke French is an 61 y.o. female.   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  She has failed nonoperative treatment to date including activity modification, oral and topical anti-inflammatories, physical therapy and immobilization as well as intra-articular steroid injection.  She presents today for surgical treatment these painful and limiting conditions.  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/2015   Rotator cuff tear 05/17/2015   right shoulder    Past Surgical History:  Procedure Laterality Date   BREAST REDUCTION SURGERY  1994   CARPAL TUNNEL RELEASE Right 01/02/2014   Procedure: RIGHT CARPAL TUNNEL RELEASE;  Surgeon: Nicki Reaper, MD;  Location: [facility];  Service: Orthopedics;  Laterality: Right;   CESAREAN SECTION  1996   ENDOSCOPIC PLANTAR FASCIOTOMY Right 06/29/2000   FOOT SURGERY Bilateral    KNEE ARTHROSCOPY W/ ACL RECONSTRUCTION Left 11/16/2011   PLANTAR FASCIA RELEASE Left 01/22/2003   PUBOVAGINAL SLING  09/08/2002   REDUCTION MAMMAPLASTY Bilateral 1994   RESECTION DISTAL CLAVICAL Right 06/03/2015   Procedure: RESECTION DISTAL CLAVICAL;  Surgeon: Loreta Ave, MD;  Location: [facility];  Service: Orthopedics;  Laterality: Right;   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 No pertinent family history. Social History:  reports that she has quit smoking. She has never used smokeless tobacco. She reports current alcohol use. She reports that she does not use drugs.  Allergies: No Known Allergies  Medications Prior to Admission  Medication Sig Dispense Refill   atorvastatin (LIPITOR) 20 MG tablet Take 1 tablet (20 mg total) by mouth daily. 90 tablet 2   Biotin 5000 MCG CAPS Take by mouth daily.     buPROPion (WELLBUTRIN XL) 150 MG 24 hr tablet Take 1 tablet (150 mg total) by mouth daily. 90 tablet 2   ergocalciferol (VITAMIN D2) 1.25 MG (50000 UT) capsule Take 1 capsule (50,000 Units total) by mouth once a week. 8 capsule 4   escitalopram (LEXAPRO) 10 MG tablet Take 1 tablet (10 mg total) by mouth daily. 90 tablet 2   levothyroxine (SYNTHROID) 125 MCG tablet Take 1 tablet (125 mcg total) by mouth in the morning on an empty stomach 90 tablet 1   meloxicam (MOBIC) 7.5 MG tablet Take 1 tablet (7.5 mg total) by mouth 2 (two) times daily as needed for pain 60 tablet 1   nystatin (MYCOSTATIN/NYSTOP) powder Apply 1 Application topically 2 (two) times  daily. 15 g 1   nystatin-triamcinolone (MYCOLOG II) cream Apply 1 Application topically at bedtime as needed. 30 g 1   Semaglutide-Weight Management (WEGOVY) 2.4 MG/0.75ML SOAJ Inject 2.4 mg into the skin once a week. 3 mL 6   temazepam (RESTORIL) 30 MG capsule Take 30 mg by mouth at bedtime as needed for sleep.     temazepam (RESTORIL) 30 MG capsule Take 1 capsule (30 mg total) by mouth at bedtime.  90 capsule 1   valACYclovir (VALTREX) 1000 MG tablet Take 1 tablet (1,000 mg total) by mouth every 12 (twelve) hours. 20 tablet 0   Vitamin D, Ergocalciferol, (DRISDOL) 1.25 MG (50000 UNIT) CAPS capsule Take 1 capsule (50,000 Units total) by mouth once a week. 8 capsule 4   diclofenac Sodium (VOLTAREN) 1 % GEL Apply 2 g topically 4 (four) times daily.     temazepam (RESTORIL) 30 MG capsule Take 1 capsule (30 mg total) by mouth at bedtime. 90 capsule 0    No results found for this or any previous visit (from the past 48 hour(s)). No results found.  Review of Systems no recent fever, chills, nausea, vomiting or changes in her appetite  Blood pressure 115/60, pulse 60, temperature (!) 97 F (36.1 C), temperature source Oral, resp. rate 14, height 5\' 3"  (1.6 m), weight 97.3 kg, SpO2 96 %. Physical Exam  Well-nourished well-developed woman in no apparent distress.  Alert and oriented x4.  Normal mood and affect.  Extraocular motions are intact.  Respirations are unlabored.  Gait is antalgic to the left.  Tender to palpation over the calcaneocuboid joint and peroneals.  Skin is healthy and intact.  Pulses are palpable in the foot.  Normal sensibility to light touch in the sural nerve distribution.  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  The risks and benefits of the alternative treatment options have been discussed in detail.  The patient wishes to proceed with surgery and specifically understands risks of bleeding, infection, nerve damage, blood clots, need for additional surgery, amputation and death.   [name], MD 09-03-2022, 8:18 AM

## 2022-08-31 NOTE — Progress Notes (Signed)
Assisted Dr. Autumn Patty with left, ankle, popliteal block. Side rails up, monitors on throughout procedure. See vital signs in flow sheet. Tolerated Procedure well.

## 2022-08-31 NOTE — Discharge Instructions (Addendum)
Brooke Arthurs, MD [facility]  Please read the following information regarding your care after surgery.  Medications  You only need a prescription for the narcotic pain medicine (ex. oxycodone, Percocet, Norco).  All of the other medicines listed below are available over the counter. ? Meloxicam that you have at home as prescribed for the first 3 days after surgery. ? acetominophen (Tylenol) 650 mg every 4-6 hours as you need for minor to moderate pain ? oxycodone as prescribed for severe pain  Narcotic pain medicine (ex. oxycodone, Percocet, Vicodin) will cause constipation.  To prevent this problem, take the following medicines while you are taking any pain medicine. ? docusate sodium (Colace) 100 mg twice a day ? senna (Senokot) 2 tablets twice a day  ? To help prevent blood clots, take a baby aspirin (81 mg) twice a day for six weeks after surgery.  You should also get up every hour while you are awake to move around.    Weight Bearing ? Do not bear any weight on the operated leg or foot.  Cast / Splint / Dressing ? Keep your splint, cast or dressing clean and dry.  Don't put anything (coat hanger, pencil, etc) down inside of it.  If it gets damp, use a hair dryer on the cool setting to dry it.  If it gets soaked, call the office to schedule an appointment for a cast change.   After your dressing, cast or splint is removed; you may shower, but do not soak or scrub the wound.  Allow the water to run over it, and then gently pat it dry.  Swelling It is normal for you to have swelling where you had surgery.  To reduce swelling and pain, keep your toes above your nose for at least 3 days after surgery.  It may be necessary to keep your foot or leg elevated for several weeks.  If it hurts, it should be elevated.  Follow Up Call my office at [phone] when you are discharged from the hospital or surgery center to schedule an appointment to be seen two weeks after surgery.  Call my  office at [phone] if you develop a fever >101.5 F, nausea, vomiting, bleeding from the surgical site or severe pain.     Post Anesthesia Home Care Instructions  Activity: Get plenty of rest for the remainder of the day. A responsible individual must stay with you for 24 hours following the procedure.  For the next 24 hours, DO NOT: -Drive a car -Advertising copywriter -Drink alcoholic beverages -Take any medication unless instructed by your physician -Make any legal decisions or sign important papers.  Meals: Start with liquid foods such as gelatin or soup. Progress to regular foods as tolerated. Avoid greasy, spicy, heavy foods. If nausea and/or vomiting occur, drink only clear liquids until the nausea and/or vomiting subsides. Call your physician if vomiting continues.  Special Instructions/Symptoms: Your throat may feel dry or sore from the anesthesia or the breathing tube placed in your throat during surgery. If this causes discomfort, gargle with warm salt water. The discomfort should disappear within 24 hours.  If you had a scopolamine patch placed behind your ear for the management of post- operative nausea and/or vomiting:  1. The medication in the patch is effective for 72 hours, after which it should be removed.  Wrap patch in a tissue and discard in the trash. Wash hands thoroughly with soap and water. 2. You may remove the patch earlier than 72 hours if  you experience unpleasant side effects which may include dry mouth, dizziness or visual disturbances. 3. Avoid touching the patch. Wash your hands with soap and water after contact with the patch.    Regional Anesthesia Blocks  1. Numbness or the inability to move the "blocked" extremity may last from 3-48 hours after placement. The length of time depends on the medication injected and your individual response to the medication. If the numbness is not going away after 48 hours, call your surgeon.  2. The extremity that is  blocked will need to be protected until the numbness is gone and the  Strength has returned. Because you cannot feel it, you will need to take extra care to avoid injury. Because it may be weak, you may have difficulty moving it or using it. You may not know what position it is in without looking at it while the block is in effect.  3. For blocks in the legs and feet, returning to weight bearing and walking needs to be done carefully. You will need to wait until the numbness is entirely gone and the strength has returned. You should be able to move your leg and foot normally before you try and bear weight or walk. You will need someone to be with you when you first try to ensure you do not fall and possibly risk injury.  4. Bruising and tenderness at the needle site are common side effects and will resolve in a few days.  5. Persistent numbness or new problems with movement should be communicated to the surgeon or the [facility] [phone] [facility] [phone]).  *May have Tylenol at 12:45pm today 08/31/22 *May have Ibuprofen at 4:45pm today 08/31/22

## 2022-08-31 NOTE — Progress Notes (Signed)
Pt with pain on the lateral aspect of her foot and her toes in PACU. Rescue block performed with 15cc of 0.5% Bupivi with 1:200k epi. Sterile prep and drape. U/S used.

## 2022-08-31 NOTE — Op Note (Signed)
08/31/2022  10:42 AM  PATIENT:  Brooke French  61 y.o. female  PRE-OPERATIVE DIAGNOSIS: 1.  Left peroneus brevis tendon tear 2.  Left peroneus longus tenosynovitis 3.  Left calcaneocuboid joint arthritis  POST-OPERATIVE DIAGNOSIS: 1.  Left peroneus brevis tendon tear 2.  Left peroneus longus tenosynovitis 3.  Left calcaneocuboid joint arthritis 4.  Hypertrophic left calcaneus peroneal tubercle  Procedure(s): 1.  Left peroneus longus tenolysis 2.  Left peroneus brevis tendon repair 3.  Left calcaneus exostectomy 4.  Left calcaneocuboid joint arthrodesis 5.  Large bone graft from left calcaneus to the calcaneocuboid joint 6.  Left foot AP and lateral radiographs  SURGEON:  Toni Arthurs, MD  ASSISTANT: Alfredo Martinez, PA-C  ANESTHESIA:   General, regional  EBL:  minimal   TOURNIQUET:   Total Tourniquet Time Documented: Thigh (Left) - 89 minutes Total: Thigh (Left) - 89 minutes  COMPLICATIONS:  None apparent  DISPOSITION:  Extubated, awake and stable to recovery.  INDICATION FOR PROCEDURE: 61 year old female with a long history of lateral left hindfoot pain.  On MRI  she has severe arthritis of the calcaneocuboid joint as well as a tear of the peroneus brevis tendon.  She has failed nonoperative treatment to date including activity modification, oral and topical anti-inflammatories, steroid injection and immobilization as well as physical therapy.  She presents today for surgical treatment of these painful and limiting left foot conditions.  The risks and benefits of the alternative treatment options have been discussed in detail.  The patient wishes to proceed with surgery and specifically understands risks of bleeding, infection, nerve damage, blood clots, need for additional surgery, amputation and death.   PROCEDURE IN DETAIL:  After pre operative consent was obtained, and the correct operative site was identified, the patient was brought to the operating room and placed  supine on the OR table.  Anesthesia was administered.  Pre-operative antibiotics were administered.  A surgical timeout was taken.  The left lower extremity was prepped and draped in standard sterile fashion with a tourniquet around the thigh.  The extremity was elevated, and the tourniquet was inflated to 250 mmHg.  A longitudinal incision was made following the course of the peroneal tendons from the tip of the fibula to the fifth metatarsal base.  Dissection was carried sharply down through the subcutaneous tissue.  Care was taken to protect branches of the sural nerve.  The peroneus brevis and longus tendons were identified.  Careful tenolysis was carried along the peroneus longus.  An os perineum was identified.  This was excised sharply from the tendon.  Tenosynovitis was excised with scissors.  A hypertrophic os perineum was identified.  This was removed with a rondure from the lateral wall of the calcaneus and smoothed with a rasp.  The peroneus brevis was then cleaned of all tenosynovitis.  A longitudinal split tear was identified from the tip of the fibula to the hypertrophic peroneal tubercle.  This was debrided with scissors and a curette.  The tear was then repaired with running 2-0 Vicryl.  Attention was turned to the calcaneocuboid joint.  Peripheral osteophytes were identified dorsally and laterally.  These were removed with a rondure and osteotome.  A intermittent retractor was placed across the joint and used to open the joint.  The remaining articular cartilage was removed with a key elevator.  The joint was then irrigated copiously.  A small drill bit was used to perforate the subchondral bone on both sides of the joint.  The subchondral  bone was then further broken up with a curved osteotome.  Attention was turned to the lateral aspect of the hindfoot where an incision was made over the isthmus of the tuberosity.  An 8 mm trephine was used to harvest autograft bone from the calcaneus.  This  was morselized and packed into the calcaneocuboid joint.  The intermittent retractor was then removed.  The joint was reduced and provisionally pinned.  Radiographs confirmed appropriate reduction of the calcaneocuboid joint.  A 4 mm partially-threaded cannulated screw from the [employer] small frag set was then placed across the joint at the medial half.  It was noted to have adequate purchase and compressed the medial half of the joint appropriately.  A size small left calcaneocuboid plate was selected from the stratum set.  The template was placed over the calcaneocuboid joint and drill holes made proximally.  The plate was then impacted into the calcaneus and further secured with 2 locking screws.  The compression slot in the distal end of the plate was then used to insert a compression pin.  The compression pin was then used to compress the calcaneocuboid joint further.  The plate was secured distally with 2 nonlocking and a locking screw.  AP and lateral radiographs confirmed appropriate arthrodesis of the calcaneocuboid joint.  The wound was then irrigated copiously and sprinkled with vancomycin powder.  Subcutaneous tissues were approximated with 3-0 Monocryl.  The skin incisions were closed with 3-0 nylon.  Sterile dressings were applied followed by a well-padded short leg splint.  The tourniquet was released after application of the dressings.  The patient was awakened from anesthesia and transported to the recovery room in stable condition.   FOLLOW UP PLAN: Nonweightbearing on the left lower extremity.  Follow-up in the office in 2 weeks for suture removal and conversion to a short leg cast.  Aspirin for DVT prophylaxis.   RADIOGRAPHS: AP and lateral radiographs of the left foot are obtained intraoperatively.  These show interval arthrodesis of the calcaneocuboid joint.  Hardware is appropriately positioned and of the appropriate lengths.  No other acute injuries are noted.    Alfredo Martinez  PA-C was present and scrubbed for the duration of the operative case. His assistance was essential in positioning the patient, prepping and draping, gaining and maintaining exposure, performing the operation, closing and dressing the wounds and applying the splint.

## 2022-09-04 ENCOUNTER — Other Ambulatory Visit (HOSPITAL_COMMUNITY): Payer: Self-pay

## 2022-09-05 ENCOUNTER — Encounter (HOSPITAL_BASED_OUTPATIENT_CLINIC_OR_DEPARTMENT_OTHER): Payer: Self-pay | Admitting: Orthopedic Surgery

## 2022-09-05 ENCOUNTER — Other Ambulatory Visit (HOSPITAL_COMMUNITY): Payer: Self-pay

## 2022-09-05 MED ORDER — WEGOVY 2.4 MG/0.75ML ~~LOC~~ SOAJ
2.4000 mg | SUBCUTANEOUS | 5 refills | Status: DC
  Filled 2022-09-05: qty 3, 28d supply, fill #0
  Filled 2022-10-01: qty 3, 28d supply, fill #1

## 2022-09-12 DIAGNOSIS — M79642 Pain in left hand: Secondary | ICD-10-CM | POA: Diagnosis not present

## 2022-09-12 DIAGNOSIS — M1812 Unilateral primary osteoarthritis of first carpometacarpal joint, left hand: Secondary | ICD-10-CM | POA: Diagnosis not present

## 2022-09-15 DIAGNOSIS — Z4789 Encounter for other orthopedic aftercare: Secondary | ICD-10-CM | POA: Diagnosis not present

## 2022-09-28 ENCOUNTER — Encounter: Payer: Self-pay | Admitting: Family

## 2022-09-30 ENCOUNTER — Encounter: Payer: Self-pay | Admitting: Family

## 2022-10-02 ENCOUNTER — Other Ambulatory Visit (HOSPITAL_COMMUNITY): Payer: Self-pay

## 2022-10-10 ENCOUNTER — Other Ambulatory Visit: Payer: Self-pay

## 2022-10-13 DIAGNOSIS — M19072 Primary osteoarthritis, left ankle and foot: Secondary | ICD-10-CM | POA: Diagnosis not present

## 2022-10-17 ENCOUNTER — Other Ambulatory Visit (HOSPITAL_COMMUNITY): Payer: Self-pay

## 2022-10-18 ENCOUNTER — Other Ambulatory Visit (HOSPITAL_COMMUNITY): Payer: Self-pay

## 2022-10-18 MED ORDER — AZITHROMYCIN 250 MG PO TABS
ORAL_TABLET | ORAL | 0 refills | Status: DC
  Filled 2022-10-18: qty 6, 5d supply, fill #0

## 2022-10-26 ENCOUNTER — Other Ambulatory Visit (HOSPITAL_COMMUNITY): Payer: Self-pay

## 2022-10-26 ENCOUNTER — Encounter: Payer: Self-pay | Admitting: Family

## 2022-10-26 MED ORDER — ZEPBOUND 10 MG/0.5ML ~~LOC~~ SOAJ
10.0000 mg | SUBCUTANEOUS | 5 refills | Status: DC
  Filled 2022-10-26 – 2022-11-14 (×2): qty 2, 28d supply, fill #0
  Filled 2022-12-13: qty 2, 28d supply, fill #1
  Filled 2023-01-07: qty 2, 28d supply, fill #2
  Filled 2023-02-07: qty 2, 28d supply, fill #3

## 2022-10-27 ENCOUNTER — Other Ambulatory Visit (HOSPITAL_COMMUNITY): Payer: Self-pay

## 2022-10-27 MED ORDER — ATORVASTATIN CALCIUM 20 MG PO TABS
20.0000 mg | ORAL_TABLET | Freq: Every day | ORAL | 3 refills | Status: DC
  Filled 2022-10-27: qty 90, 90d supply, fill #0
  Filled 2023-01-29: qty 90, 90d supply, fill #1
  Filled 2023-05-10: qty 90, 90d supply, fill #2
  Filled 2023-08-09: qty 90, 90d supply, fill #3

## 2022-11-05 ENCOUNTER — Other Ambulatory Visit (HOSPITAL_COMMUNITY): Payer: Self-pay

## 2022-11-06 ENCOUNTER — Other Ambulatory Visit (HOSPITAL_COMMUNITY): Payer: Self-pay

## 2022-11-06 MED ORDER — BUPROPION HCL ER (XL) 150 MG PO TB24
150.0000 mg | ORAL_TABLET | Freq: Every day | ORAL | 2 refills | Status: DC
  Filled 2022-11-06: qty 90, 90d supply, fill #0
  Filled 2023-02-13: qty 90, 90d supply, fill #1
  Filled 2023-05-13: qty 90, 90d supply, fill #2

## 2022-11-14 ENCOUNTER — Other Ambulatory Visit (HOSPITAL_COMMUNITY): Payer: Self-pay

## 2022-11-14 ENCOUNTER — Encounter: Payer: Self-pay | Admitting: Family

## 2022-11-14 DIAGNOSIS — B009 Herpesviral infection, unspecified: Secondary | ICD-10-CM | POA: Diagnosis not present

## 2022-11-14 DIAGNOSIS — Z01419 Encounter for gynecological examination (general) (routine) without abnormal findings: Secondary | ICD-10-CM | POA: Diagnosis not present

## 2022-11-14 DIAGNOSIS — Z6838 Body mass index (BMI) 38.0-38.9, adult: Secondary | ICD-10-CM | POA: Diagnosis not present

## 2022-11-14 MED ORDER — VALACYCLOVIR HCL 500 MG PO TABS
500.0000 mg | ORAL_TABLET | Freq: Every day | ORAL | 3 refills | Status: DC
  Filled 2022-11-14: qty 90, 90d supply, fill #0
  Filled 2023-02-05: qty 90, 90d supply, fill #1
  Filled 2023-05-10: qty 90, 90d supply, fill #2
  Filled 2023-08-09: qty 90, 90d supply, fill #3

## 2022-11-16 DIAGNOSIS — E669 Obesity, unspecified: Secondary | ICD-10-CM | POA: Diagnosis not present

## 2022-11-16 DIAGNOSIS — E785 Hyperlipidemia, unspecified: Secondary | ICD-10-CM | POA: Diagnosis not present

## 2022-11-17 DIAGNOSIS — M19072 Primary osteoarthritis, left ankle and foot: Secondary | ICD-10-CM | POA: Diagnosis not present

## 2022-11-17 DIAGNOSIS — Z4789 Encounter for other orthopedic aftercare: Secondary | ICD-10-CM | POA: Diagnosis not present

## 2022-11-20 ENCOUNTER — Other Ambulatory Visit (HOSPITAL_COMMUNITY): Payer: Self-pay

## 2022-11-21 ENCOUNTER — Other Ambulatory Visit (HOSPITAL_COMMUNITY): Payer: Self-pay

## 2022-11-21 MED ORDER — TEMAZEPAM 30 MG PO CAPS
30.0000 mg | ORAL_CAPSULE | Freq: Every day | ORAL | 1 refills | Status: DC
  Filled 2022-11-21: qty 90, 90d supply, fill #0
  Filled 2023-01-29 – 2023-02-13 (×3): qty 90, 90d supply, fill #1

## 2022-12-14 ENCOUNTER — Other Ambulatory Visit (HOSPITAL_COMMUNITY): Payer: Self-pay

## 2022-12-14 MED ORDER — ZEPBOUND 10 MG/0.5ML ~~LOC~~ SOAJ
10.0000 mg | SUBCUTANEOUS | 1 refills | Status: DC
  Filled 2022-12-14: qty 6, 84d supply, fill #0

## 2022-12-15 ENCOUNTER — Other Ambulatory Visit (HOSPITAL_COMMUNITY): Payer: Self-pay

## 2022-12-15 DIAGNOSIS — M19072 Primary osteoarthritis, left ankle and foot: Secondary | ICD-10-CM | POA: Diagnosis not present

## 2023-01-07 ENCOUNTER — Other Ambulatory Visit (HOSPITAL_COMMUNITY): Payer: Self-pay

## 2023-01-08 ENCOUNTER — Other Ambulatory Visit (HOSPITAL_COMMUNITY): Payer: Self-pay

## 2023-01-08 MED ORDER — ESCITALOPRAM OXALATE 10 MG PO TABS
10.0000 mg | ORAL_TABLET | Freq: Every day | ORAL | 2 refills | Status: DC
  Filled 2023-01-08: qty 90, 90d supply, fill #0
  Filled 2023-04-03: qty 90, 90d supply, fill #1
  Filled 2023-07-19: qty 90, 90d supply, fill #2

## 2023-01-17 ENCOUNTER — Telehealth: Payer: Self-pay | Admitting: Nurse Practitioner

## 2023-01-17 ENCOUNTER — Other Ambulatory Visit (HOSPITAL_COMMUNITY): Payer: Self-pay

## 2023-01-17 DIAGNOSIS — J309 Allergic rhinitis, unspecified: Secondary | ICD-10-CM

## 2023-01-17 DIAGNOSIS — R051 Acute cough: Secondary | ICD-10-CM

## 2023-01-17 MED ORDER — IPRATROPIUM BROMIDE 0.03 % NA SOLN
2.0000 | Freq: Two times a day (BID) | NASAL | 12 refills | Status: DC
  Filled 2023-01-17: qty 30, 30d supply, fill #0

## 2023-01-17 MED ORDER — PREDNISONE 20 MG PO TABS
20.0000 mg | ORAL_TABLET | Freq: Two times a day (BID) | ORAL | 0 refills | Status: AC
  Filled 2023-01-17: qty 10, 5d supply, fill #0

## 2023-01-17 NOTE — Progress Notes (Signed)
E-Visit for Cough  We are sorry that you are not feeling well.  Here is how we plan to help!  Based on your presentation I believe you most likely have A cough due to allergies.  I recommend that you start the an over-the counter-allergy medication such as Claritin 10 mg or Zyrtec 10 mg daily.    Plus  In addition you may use A non-prescription cough medication called Mucinex DM: take 2 tablets every 12 hours.  Prednisone 20 mg twice daily for 5 days  Meds ordered this encounter  Medications   ipratropium (ATROVENT) 0.03 % nasal spray    Sig: Place 2 sprays into both nostrils every 12 (twelve) hours.    Dispense:  30 mL    Refill:  12   predniSONE (DELTASONE) 20 MG tablet    Sig: Take 1 tablet (20 mg total) by mouth 2 (two) times daily with a meal for 5 days.    Dispense:  10 tablet    Refill:  0     From your responses in the eVisit questionnaire you describe inflammation in the upper respiratory tract which is causing a significant cough.  This is commonly called Bronchitis and has four common causes:   Allergies Viral Infections Acid Reflux Bacterial Infection Allergies, viruses and acid reflux are treated by controlling symptoms or eliminating the cause. An example might be a cough caused by taking certain blood pressure medications. You stop the cough by changing the medication. Another example might be a cough caused by acid reflux. Controlling the reflux helps control the cough.  USE OF BRONCHODILATOR ("RESCUE") INHALERS: There is a risk from using your bronchodilator too frequently.  The risk is that over-reliance on a medication which only relaxes the muscles surrounding the breathing tubes can reduce the effectiveness of medications prescribed to reduce swelling and congestion of the tubes themselves.  Although you feel brief relief from the bronchodilator inhaler, your asthma may actually be worsening with the tubes becoming more swollen and filled with mucus.  This can delay  other crucial treatments, such as oral steroid medications. If you need to use a bronchodilator inhaler daily, several times per day, you should discuss this with your provider.  There are probably better treatments that could be used to keep your asthma under control.     HOME CARE Only take medications as instructed by your medical team. Complete the entire course of an antibiotic. Drink plenty of fluids and get plenty of rest. Avoid close contacts especially the very young and the elderly Cover your mouth if you cough or cough into your sleeve. Always remember to wash your hands A steam or ultrasonic humidifier can help congestion.   GET HELP RIGHT AWAY IF: You develop worsening fever. You become short of breath You cough up blood. Your symptoms persist after you have completed your treatment plan MAKE SURE YOU  Understand these instructions. Will watch your condition. Will get help right away if you are not doing well or get worse.    Thank you for choosing an e-visit.  Your e-visit answers were reviewed by a board certified advanced clinical practitioner to complete your personal care plan. Depending upon the condition, your plan could have included both over the counter or prescription medications.  Please review your pharmacy choice. Make sure the pharmacy is open so you can pick up prescription now. If there is a problem, you may contact your provider through [place] messaging and have the prescription routed to another  pharmacy.  Your safety is important to [place]. If you have drug allergies check your prescription carefully.   For the next 24 hours you can use MyChart to ask questions about today's visit, request a non-urgent call back, or ask for a work or school excuse. You will get an email in the next two days asking about your experience. I hope that your e-visit has been valuable and will speed your recovery.   Meds ordered this encounter  Medications   ipratropium  (ATROVENT) 0.03 % nasal spray    Sig: Place 2 sprays into both nostrils every 12 (twelve) hours.    Dispense:  30 mL    Refill:  12   predniSONE (DELTASONE) 20 MG tablet    Sig: Take 1 tablet (20 mg total) by mouth 2 (two) times daily with a meal for 5 days.    Dispense:  10 tablet    Refill:  0    I spent approximately 7 minutes reviewing the patient's history, current symptoms and coordinating their plan of care today.

## 2023-01-23 DIAGNOSIS — Z13820 Encounter for screening for osteoporosis: Secondary | ICD-10-CM | POA: Diagnosis not present

## 2023-01-29 ENCOUNTER — Other Ambulatory Visit: Payer: Self-pay

## 2023-01-29 ENCOUNTER — Other Ambulatory Visit (HOSPITAL_COMMUNITY): Payer: Self-pay

## 2023-01-29 DIAGNOSIS — M5416 Radiculopathy, lumbar region: Secondary | ICD-10-CM | POA: Diagnosis not present

## 2023-01-29 MED ORDER — METHYLPREDNISOLONE 4 MG PO TBPK
ORAL_TABLET | ORAL | 0 refills | Status: DC
  Filled 2023-01-29: qty 21, 6d supply, fill #0

## 2023-01-30 ENCOUNTER — Other Ambulatory Visit (HOSPITAL_COMMUNITY): Payer: Self-pay

## 2023-01-30 MED ORDER — LEVOTHYROXINE SODIUM 125 MCG PO TABS
125.0000 ug | ORAL_TABLET | Freq: Every morning | ORAL | 1 refills | Status: DC
  Filled 2023-01-30: qty 90, 90d supply, fill #0
  Filled 2023-05-10: qty 90, 90d supply, fill #1

## 2023-02-07 ENCOUNTER — Encounter: Payer: Self-pay | Admitting: Family

## 2023-02-13 ENCOUNTER — Other Ambulatory Visit: Payer: Self-pay

## 2023-02-13 ENCOUNTER — Other Ambulatory Visit (HOSPITAL_COMMUNITY): Payer: Self-pay

## 2023-02-19 DIAGNOSIS — D638 Anemia in other chronic diseases classified elsewhere: Secondary | ICD-10-CM | POA: Diagnosis not present

## 2023-02-19 DIAGNOSIS — F329 Major depressive disorder, single episode, unspecified: Secondary | ICD-10-CM | POA: Diagnosis not present

## 2023-02-19 DIAGNOSIS — E785 Hyperlipidemia, unspecified: Secondary | ICD-10-CM | POA: Diagnosis not present

## 2023-02-19 DIAGNOSIS — E669 Obesity, unspecified: Secondary | ICD-10-CM | POA: Diagnosis not present

## 2023-04-02 ENCOUNTER — Other Ambulatory Visit: Payer: Self-pay | Admitting: Internal Medicine

## 2023-04-02 ENCOUNTER — Encounter: Payer: Self-pay | Admitting: Family

## 2023-04-02 ENCOUNTER — Other Ambulatory Visit: Payer: Self-pay

## 2023-04-02 DIAGNOSIS — Z1231 Encounter for screening mammogram for malignant neoplasm of breast: Secondary | ICD-10-CM

## 2023-04-02 DIAGNOSIS — D508 Other iron deficiency anemias: Secondary | ICD-10-CM

## 2023-04-03 ENCOUNTER — Other Ambulatory Visit (HOSPITAL_COMMUNITY): Payer: Self-pay

## 2023-04-04 ENCOUNTER — Other Ambulatory Visit: Payer: Self-pay

## 2023-04-04 ENCOUNTER — Inpatient Hospital Stay: Payer: Commercial Managed Care - PPO | Attending: Hematology & Oncology

## 2023-04-04 DIAGNOSIS — D508 Other iron deficiency anemias: Secondary | ICD-10-CM | POA: Insufficient documentation

## 2023-04-04 LAB — CBC WITH DIFFERENTIAL (CANCER CENTER ONLY)
Abs Immature Granulocytes: 0.01 10*3/uL (ref 0.00–0.07)
Basophils Absolute: 0 10*3/uL (ref 0.0–0.1)
Basophils Relative: 0 %
Eosinophils Absolute: 0.1 10*3/uL (ref 0.0–0.5)
Eosinophils Relative: 1 %
HCT: 39.8 % (ref 36.0–46.0)
Hemoglobin: 13.2 g/dL (ref 12.0–15.0)
Immature Granulocytes: 0 %
Lymphocytes Relative: 35 %
Lymphs Abs: 2 10*3/uL (ref 0.7–4.0)
MCH: 32.2 pg (ref 26.0–34.0)
MCHC: 33.2 g/dL (ref 30.0–36.0)
MCV: 97.1 fL (ref 80.0–100.0)
Monocytes Absolute: 0.6 10*3/uL (ref 0.1–1.0)
Monocytes Relative: 10 %
Neutro Abs: 2.9 10*3/uL (ref 1.7–7.7)
Neutrophils Relative %: 54 %
Platelet Count: 216 10*3/uL (ref 150–400)
RBC: 4.1 MIL/uL (ref 3.87–5.11)
RDW: 14 % (ref 11.5–15.5)
WBC Count: 5.5 10*3/uL (ref 4.0–10.5)
nRBC: 0 % (ref 0.0–0.2)

## 2023-04-04 LAB — VITAMIN D 25 HYDROXY (VIT D DEFICIENCY, FRACTURES): Vit D, 25-Hydroxy: 53.66 ng/mL (ref 30–100)

## 2023-04-04 LAB — CMP (CANCER CENTER ONLY)
ALT: 38 U/L (ref 0–44)
AST: 27 U/L (ref 15–41)
Albumin: 3.6 g/dL (ref 3.5–5.0)
Alkaline Phosphatase: 104 U/L (ref 38–126)
Anion gap: 5 (ref 5–15)
BUN: 25 mg/dL — ABNORMAL HIGH (ref 8–23)
CO2: 30 mmol/L (ref 22–32)
Calcium: 9.5 mg/dL (ref 8.9–10.3)
Chloride: 105 mmol/L (ref 98–111)
Creatinine: 0.66 mg/dL (ref 0.44–1.00)
GFR, Estimated: >60 mL/min (ref >60)
Glucose, Bld: 73 mg/dL (ref 70–99)
Potassium: 4.2 mmol/L (ref 3.5–5.1)
Sodium: 140 mmol/L (ref 135–145)
Total Bilirubin: 0.4 mg/dL (ref 0.3–1.2)
Total Protein: 6.3 g/dL — ABNORMAL LOW (ref 6.5–8.1)

## 2023-04-04 LAB — IRON AND IRON BINDING CAPACITY (CC-WL,HP ONLY)
Iron: 77 ug/dL (ref 28–170)
Saturation Ratios: 20 % (ref 10.4–31.8)
TIBC: 386 ug/dL (ref 250–450)
UIBC: 309 ug/dL (ref 148–442)

## 2023-04-04 LAB — FERRITIN: Ferritin: 210 ng/mL (ref 11–307)

## 2023-04-10 DIAGNOSIS — Z961 Presence of intraocular lens: Secondary | ICD-10-CM | POA: Diagnosis not present

## 2023-04-10 DIAGNOSIS — H52203 Unspecified astigmatism, bilateral: Secondary | ICD-10-CM | POA: Diagnosis not present

## 2023-04-20 ENCOUNTER — Encounter: Payer: Self-pay | Admitting: Family

## 2023-04-20 ENCOUNTER — Inpatient Hospital Stay: Payer: Commercial Managed Care - PPO | Attending: Family | Admitting: Family

## 2023-04-20 ENCOUNTER — Other Ambulatory Visit: Payer: Self-pay

## 2023-04-20 VITALS — BP 105/64 | HR 65 | Temp 97.8°F | Resp 18 | Wt 231.0 lb

## 2023-04-20 DIAGNOSIS — E559 Vitamin D deficiency, unspecified: Secondary | ICD-10-CM | POA: Insufficient documentation

## 2023-04-20 DIAGNOSIS — Z79899 Other long term (current) drug therapy: Secondary | ICD-10-CM | POA: Insufficient documentation

## 2023-04-20 DIAGNOSIS — R5383 Other fatigue: Secondary | ICD-10-CM | POA: Insufficient documentation

## 2023-04-20 DIAGNOSIS — E538 Deficiency of other specified B group vitamins: Secondary | ICD-10-CM | POA: Diagnosis not present

## 2023-04-20 DIAGNOSIS — Z9884 Bariatric surgery status: Secondary | ICD-10-CM | POA: Insufficient documentation

## 2023-04-20 DIAGNOSIS — D508 Other iron deficiency anemias: Secondary | ICD-10-CM | POA: Diagnosis not present

## 2023-04-20 DIAGNOSIS — K912 Postsurgical malabsorption, not elsewhere classified: Secondary | ICD-10-CM | POA: Insufficient documentation

## 2023-04-20 NOTE — Progress Notes (Signed)
Hematology and Oncology Follow Up Visit  Brooke French [id number] 08/28/1961 62 y.o. 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 She is symptomatic with fatigue.  Iron studies 2 weeks ago showed an iron saturation of 20% and ferritin 210.  No blood loss noted. No bruising or petechiae.  No fever, chills, n/v, cough, rash, dizziness, SOB, chest pain, palpitations, abdominal pain or changes in bowel or bladder habits.  She goes next week for an injection with Ortho to treat sciatica in the right leg.  No falls or syncope reported.  Appetite and hydration are good.   ECOG Performance Status: 1 - Symptomatic but completely ambulatory  Medications:  Allergies as of 04/20/2023   No Known Allergies      Medication List        Accurate as of April 20, 2023  3:25 PM. If you have any questions, ask your nurse or doctor.          Aspirin Low Dose 81 MG tablet Generic drug: aspirin EC Take 1 tablet (81 mg total) by mouth 2 (two) times daily.   atorvastatin 20 MG tablet Commonly known as: Lipitor Take 1 tablet (20 mg total) by mouth daily.   azithromycin 250 MG tablet Commonly known as: Zithromax Z-Pak Take 2 tablets today,  then one tablet every day for four more days.   Biotin 5000 MCG Caps Take by mouth daily.   buPROPion 150 MG 24 hr tablet Commonly known as: WELLBUTRIN XL Take 1 tablet (150 mg total) by mouth daily.   diclofenac Sodium 1 % Gel Commonly known as: VOLTAREN Apply 2 g topically 4 (four) times daily.   docusate sodium 100 MG capsule Commonly known as: Colace Take 1 capsule (100 mg total) by mouth 2 (two) times daily. While taking narcotic pain medicine.   ergocalciferol 1.25 MG (50000 UT) capsule Commonly known as: VITAMIN  D2 Take 1 capsule (50,000 Units total) by mouth once a week.   Vitamin D (Ergocalciferol) 1.25 MG (50000 UNIT) Caps capsule Commonly known as: DRISDOL Take 1 capsule (50,000 Units total) by mouth once a week.   escitalopram 10 MG tablet Commonly known as: LEXAPRO Take 1 tablet (10 mg total) by mouth daily.   ipratropium 0.03 % nasal spray Commonly known as: ATROVENT Place 2 sprays into both nostrils every 12 (twelve) hours.   levothyroxine 125 MCG tablet Commonly known as: SYNTHROID Take 1 tablet (125 mcg total) by mouth in the morning on an empty stomach   meloxicam 7.5 MG tablet Commonly known as: MOBIC Take 1 tablet (7.5 mg total) by mouth 2 (two) times daily as needed for pain   methylPREDNISolone 4 MG Tbpk tablet Commonly known as: Medrol take as directed on packaging as instructed by pharmacist.   nystatin powder Commonly known as: MYCOSTATIN/NYSTOP Apply 1 Application topically 2 (two) times daily.   nystatin-triamcinolone cream Commonly known as: MYCOLOG II Apply 1 Application topically at bedtime as needed.   senna 8.6 MG Tabs tablet Commonly known as: SENOKOT Take 2 tablets (17.2 mg total) by mouth 2 (two) times daily.   temazepam 30 MG capsule Commonly known as: RESTORIL Take 30 mg by mouth at bedtime as needed for sleep.   temazepam  30 MG capsule Commonly known as: RESTORIL Take 1 capsule (30 mg total) by mouth at bedtime.   temazepam 30 MG capsule Commonly known as: RESTORIL Take 1 capsule (30 mg total) by mouth at bedtime.   valACYclovir 1000 MG tablet Commonly known as: Valtrex Take 1 tablet (1,000 mg total) by mouth every 12 (twelve) hours.   valACYclovir 500 MG tablet Commonly known as: VALTREX Take 1 tablet (500 mg total) by mouth daily.   Wegovy 2.4 MG/0.75ML Soaj Generic drug: Semaglutide-Weight Management Inject 2.4 mg into the skin every 7 (seven) days.   Zepbound 10 MG/0.5ML Pen Generic drug: tirzepatide Inject 10 mg into the  skin once a week.   Zepbound 10 MG/0.5ML Pen Generic drug: tirzepatide Inject 10 mg into the skin once a week.        Allergies: No Known Allergies  Past Medical History, Surgical history, Social history, and Family History were reviewed and updated.  Review of Systems: All other 10 point review of systems is negative.   Physical Exam:  vitals were not taken for this visit.   Wt Readings from Last 3 Encounters:  08/31/22 214 lb 8.1 oz (97.3 kg)  09/20/21 259 lb 12.8 oz (117.8 kg)  05/13/20 (!) 248 lb (112.5 kg)    Ocular: Sclerae unicteric, pupils equal, round and reactive to light Ear-nose-throat: Oropharynx clear, dentition fair Lymphatic: No cervical or supraclavicular adenopathy Lungs no rales or rhonchi, good excursion bilaterally Heart regular rate and rhythm, no murmur appreciated Abd soft, nontender, positive bowel sounds MSK no focal spinal tenderness, no joint edema Neuro: non-focal, well-oriented, appropriate affect Breasts: Deferred   Lab Results  Component Value Date   WBC 5.5 04/04/2023   HGB 13.2 04/04/2023   HCT 39.8 04/04/2023   MCV 97.1 04/04/2023   PLT 216 04/04/2023   Lab Results  Component Value Date   FERRITIN 210 04/04/2023   IRON 77 04/04/2023   TIBC 386 04/04/2023   UIBC 309 04/04/2023   IRONPCTSAT 20 04/04/2023   Lab Results  Component Value Date   RETICCTPCT 1.8 03/13/2019   RBC 4.10 04/04/2023   No results found for: "KPAFRELGTCHN", "LAMBDASER", "KAPLAMBRATIO" No results found for: "IGGSERUM", "IGA", "IGMSERUM" No results found for: "TOTALPROTELP", "ALBUMINELP", "A1GS", "A2GS", "BETS", "BETA2SER", "GAMS", "MSPIKE", "SPEI"   Chemistry      Component Value Date/Time   NA 140 04/04/2023 1440   NA 151 (H) 09/05/2017 1445   K 4.2 04/04/2023 1440   K 4.3 09/05/2017 1445   CL 105 04/04/2023 1440   CL 104 09/05/2017 1445   CO2 30 04/04/2023 1440   CO2 31 09/05/2017 1445   BUN 25 (H) 04/04/2023 1440   BUN 19 09/05/2017 1445    CREATININE 0.66 04/04/2023 1440   CREATININE 0.9 09/05/2017 1445      Component Value Date/Time   CALCIUM 9.5 04/04/2023 1440   CALCIUM 9.6 09/05/2017 1445   ALKPHOS 104 04/04/2023 1440   ALKPHOS 108 (H) 09/05/2017 1445   AST 27 04/04/2023 1440   ALT 38 04/04/2023 1440   ALT 33 09/05/2017 1445   BILITOT 0.4 04/04/2023 1440       Impression and Plan: Brooke French is a very pleasant 62 yo caucasian female with iron deficiency anemia secondary to malabsorption after gastric bypass.  Iron studies are stable at this time. No infusion needed at this time.  Vit D stable. Continue weekly supplement.  We will check a B 12 and folate to see if these are low and  need replaced.  Follow-up in 6 months.   Eileen Stanford, NP 7/5/20243:25 PM

## 2023-04-24 DIAGNOSIS — M5416 Radiculopathy, lumbar region: Secondary | ICD-10-CM | POA: Diagnosis not present

## 2023-04-26 ENCOUNTER — Ambulatory Visit
Admission: RE | Admit: 2023-04-26 | Discharge: 2023-04-26 | Disposition: A | Discharge status: Home / Self Care | Payer: Commercial Managed Care - PPO | Source: Ambulatory Visit | Attending: Internal Medicine | Admitting: Internal Medicine

## 2023-04-26 DIAGNOSIS — Z1231 Encounter for screening mammogram for malignant neoplasm of breast: Secondary | ICD-10-CM

## 2023-05-10 ENCOUNTER — Other Ambulatory Visit (HOSPITAL_COMMUNITY): Payer: Self-pay

## 2023-05-11 ENCOUNTER — Other Ambulatory Visit (HOSPITAL_COMMUNITY): Payer: Self-pay

## 2023-05-11 MED ORDER — NYSTATIN-TRIAMCINOLONE 100000-0.1 UNIT/GM-% EX CREA
1.0000 | TOPICAL_CREAM | Freq: Every day | CUTANEOUS | 1 refills | Status: AC
  Filled 2023-05-11: qty 30, 30d supply, fill #0
  Filled 2023-08-09: qty 30, 30d supply, fill #1

## 2023-05-14 ENCOUNTER — Other Ambulatory Visit (HOSPITAL_COMMUNITY): Payer: Self-pay

## 2023-05-22 ENCOUNTER — Other Ambulatory Visit (HOSPITAL_COMMUNITY): Payer: Self-pay

## 2023-05-23 ENCOUNTER — Other Ambulatory Visit: Payer: Self-pay | Admitting: Hematology & Oncology

## 2023-05-23 ENCOUNTER — Other Ambulatory Visit (HOSPITAL_COMMUNITY): Payer: Self-pay

## 2023-05-23 MED ORDER — VITAMIN D (ERGOCALCIFEROL) 1.25 MG (50000 UNIT) PO CAPS
50000.0000 [IU] | ORAL_CAPSULE | ORAL | 4 refills | Status: AC
  Filled 2023-05-23: qty 8, 56d supply, fill #0
  Filled 2023-07-19: qty 8, 56d supply, fill #1
  Filled 2023-10-18: qty 8, 56d supply, fill #2
  Filled 2024-01-15: qty 8, 56d supply, fill #3

## 2023-05-24 ENCOUNTER — Other Ambulatory Visit (HOSPITAL_COMMUNITY): Payer: Self-pay

## 2023-05-24 MED ORDER — TEMAZEPAM 30 MG PO CAPS
30.0000 mg | ORAL_CAPSULE | Freq: Every day | ORAL | 1 refills | Status: DC
  Filled 2023-05-24: qty 90, 90d supply, fill #0
  Filled 2023-08-09 – 2023-08-20 (×2): qty 90, 90d supply, fill #1

## 2023-08-06 ENCOUNTER — Other Ambulatory Visit (HOSPITAL_COMMUNITY): Payer: Self-pay

## 2023-08-07 ENCOUNTER — Other Ambulatory Visit (HOSPITAL_COMMUNITY): Payer: Self-pay

## 2023-08-07 MED ORDER — ESCITALOPRAM OXALATE 10 MG PO TABS
10.0000 mg | ORAL_TABLET | Freq: Every day | ORAL | 2 refills | Status: AC
  Filled 2023-10-18: qty 90, 90d supply, fill #0
  Filled 2024-01-15: qty 90, 90d supply, fill #1
  Filled 2024-04-28: qty 90, 90d supply, fill #2

## 2023-08-09 ENCOUNTER — Other Ambulatory Visit (HOSPITAL_COMMUNITY): Payer: Self-pay

## 2023-08-09 DIAGNOSIS — D638 Anemia in other chronic diseases classified elsewhere: Secondary | ICD-10-CM | POA: Diagnosis not present

## 2023-08-09 DIAGNOSIS — E039 Hypothyroidism, unspecified: Secondary | ICD-10-CM | POA: Diagnosis not present

## 2023-08-09 DIAGNOSIS — Z1389 Encounter for screening for other disorder: Secondary | ICD-10-CM | POA: Diagnosis not present

## 2023-08-09 DIAGNOSIS — Z1212 Encounter for screening for malignant neoplasm of rectum: Secondary | ICD-10-CM | POA: Diagnosis not present

## 2023-08-10 ENCOUNTER — Other Ambulatory Visit (HOSPITAL_COMMUNITY): Payer: Self-pay

## 2023-08-10 ENCOUNTER — Encounter (HOSPITAL_COMMUNITY): Payer: Self-pay

## 2023-08-10 ENCOUNTER — Other Ambulatory Visit: Payer: Self-pay

## 2023-08-10 MED ORDER — LEVOTHYROXINE SODIUM 125 MCG PO TABS
125.0000 ug | ORAL_TABLET | Freq: Every morning | ORAL | 1 refills | Status: DC
  Filled 2023-08-10: qty 90, 90d supply, fill #0
  Filled 2023-11-26: qty 90, 90d supply, fill #1

## 2023-08-13 DIAGNOSIS — Z1389 Encounter for screening for other disorder: Secondary | ICD-10-CM | POA: Diagnosis not present

## 2023-08-13 DIAGNOSIS — E039 Hypothyroidism, unspecified: Secondary | ICD-10-CM | POA: Diagnosis not present

## 2023-08-13 DIAGNOSIS — Z1212 Encounter for screening for malignant neoplasm of rectum: Secondary | ICD-10-CM | POA: Diagnosis not present

## 2023-08-13 DIAGNOSIS — D638 Anemia in other chronic diseases classified elsewhere: Secondary | ICD-10-CM | POA: Diagnosis not present

## 2023-08-14 ENCOUNTER — Other Ambulatory Visit (HOSPITAL_COMMUNITY): Payer: Self-pay

## 2023-08-15 ENCOUNTER — Other Ambulatory Visit (HOSPITAL_COMMUNITY): Payer: Self-pay

## 2023-08-15 MED ORDER — BUPROPION HCL ER (XL) 150 MG PO TB24
150.0000 mg | ORAL_TABLET | Freq: Every day | ORAL | 2 refills | Status: DC
  Filled 2023-08-15: qty 90, 90d supply, fill #0
  Filled 2023-11-26: qty 90, 90d supply, fill #1
  Filled 2024-02-27: qty 90, 90d supply, fill #2

## 2023-08-20 ENCOUNTER — Other Ambulatory Visit: Payer: Self-pay

## 2023-08-20 ENCOUNTER — Other Ambulatory Visit (HOSPITAL_COMMUNITY): Payer: Self-pay

## 2023-08-20 DIAGNOSIS — R82998 Other abnormal findings in urine: Secondary | ICD-10-CM | POA: Diagnosis not present

## 2023-08-20 MED ORDER — BETAMETHASONE DIPROPIONATE 0.05 % EX CREA
1.0000 | TOPICAL_CREAM | Freq: Two times a day (BID) | CUTANEOUS | 0 refills | Status: AC
  Filled 2023-08-20 (×2): qty 30, 15d supply, fill #0

## 2023-08-24 DIAGNOSIS — E039 Hypothyroidism, unspecified: Secondary | ICD-10-CM | POA: Diagnosis not present

## 2023-08-24 DIAGNOSIS — F331 Major depressive disorder, recurrent, moderate: Secondary | ICD-10-CM | POA: Diagnosis not present

## 2023-08-24 DIAGNOSIS — E7849 Other hyperlipidemia: Secondary | ICD-10-CM | POA: Diagnosis not present

## 2023-08-24 DIAGNOSIS — Z6841 Body Mass Index (BMI) 40.0 and over, adult: Secondary | ICD-10-CM | POA: Diagnosis not present

## 2023-10-18 ENCOUNTER — Encounter (HOSPITAL_COMMUNITY): Payer: Self-pay

## 2023-10-18 ENCOUNTER — Other Ambulatory Visit (HOSPITAL_COMMUNITY): Payer: Self-pay

## 2023-10-18 ENCOUNTER — Encounter: Payer: Self-pay | Admitting: Family

## 2023-10-18 MED ORDER — ATORVASTATIN CALCIUM 20 MG PO TABS
20.0000 mg | ORAL_TABLET | Freq: Every day | ORAL | 3 refills | Status: AC
  Filled 2023-10-18 – 2023-11-26 (×2): qty 90, 90d supply, fill #0
  Filled 2024-02-27: qty 90, 90d supply, fill #1
  Filled 2024-06-13: qty 90, 90d supply, fill #2

## 2023-10-22 ENCOUNTER — Other Ambulatory Visit: Payer: Self-pay

## 2023-10-22 ENCOUNTER — Inpatient Hospital Stay: Payer: 59 | Attending: Hematology & Oncology

## 2023-10-22 ENCOUNTER — Inpatient Hospital Stay (HOSPITAL_BASED_OUTPATIENT_CLINIC_OR_DEPARTMENT_OTHER): Payer: 59 | Admitting: Family

## 2023-10-22 VITALS — BP 111/58 | HR 64 | Temp 98.0°F | Resp 17 | Wt 245.8 lb

## 2023-10-22 DIAGNOSIS — Z9884 Bariatric surgery status: Secondary | ICD-10-CM | POA: Diagnosis not present

## 2023-10-22 DIAGNOSIS — R5383 Other fatigue: Secondary | ICD-10-CM | POA: Insufficient documentation

## 2023-10-22 DIAGNOSIS — Z79899 Other long term (current) drug therapy: Secondary | ICD-10-CM | POA: Insufficient documentation

## 2023-10-22 DIAGNOSIS — K912 Postsurgical malabsorption, not elsewhere classified: Secondary | ICD-10-CM | POA: Diagnosis not present

## 2023-10-22 DIAGNOSIS — E559 Vitamin D deficiency, unspecified: Secondary | ICD-10-CM | POA: Insufficient documentation

## 2023-10-22 DIAGNOSIS — D508 Other iron deficiency anemias: Secondary | ICD-10-CM | POA: Insufficient documentation

## 2023-10-22 DIAGNOSIS — E538 Deficiency of other specified B group vitamins: Secondary | ICD-10-CM

## 2023-10-22 LAB — CBC WITH DIFFERENTIAL (CANCER CENTER ONLY)
Abs Immature Granulocytes: 0.01 10*3/uL (ref 0.00–0.07)
Basophils Absolute: 0 10*3/uL (ref 0.0–0.1)
Basophils Relative: 0 %
Eosinophils Absolute: 0 10*3/uL (ref 0.0–0.5)
Eosinophils Relative: 1 %
HCT: 45.5 % (ref 36.0–46.0)
Hemoglobin: 15 g/dL (ref 12.0–15.0)
Immature Granulocytes: 0 %
Lymphocytes Relative: 31 %
Lymphs Abs: 2.6 10*3/uL (ref 0.7–4.0)
MCH: 32.5 pg (ref 26.0–34.0)
MCHC: 33 g/dL (ref 30.0–36.0)
MCV: 98.7 fL (ref 80.0–100.0)
Monocytes Absolute: 0.7 10*3/uL (ref 0.1–1.0)
Monocytes Relative: 8 %
Neutro Abs: 5.1 10*3/uL (ref 1.7–7.7)
Neutrophils Relative %: 60 %
Platelet Count: 262 10*3/uL (ref 150–400)
RBC: 4.61 MIL/uL (ref 3.87–5.11)
RDW: 13.6 % (ref 11.5–15.5)
WBC Count: 8.4 10*3/uL (ref 4.0–10.5)
nRBC: 0 % (ref 0.0–0.2)

## 2023-10-22 LAB — FOLATE: Folate: 9.7 ng/mL (ref >5.9)

## 2023-10-22 LAB — VITAMIN D 25 HYDROXY (VIT D DEFICIENCY, FRACTURES): Vit D, 25-Hydroxy: 57.03 ng/mL (ref 30–100)

## 2023-10-22 LAB — RETICULOCYTES
Immature Retic Fract: 10.9 % (ref 2.3–15.9)
RBC.: 4.56 MIL/uL (ref 3.87–5.11)
Retic Count, Absolute: 93 10*3/uL (ref 19.0–186.0)
Retic Ct Pct: 2 % (ref 0.4–3.1)

## 2023-10-22 LAB — FERRITIN: Ferritin: 203 ng/mL (ref 11–307)

## 2023-10-22 LAB — VITAMIN B12: Vitamin B-12: 1121 pg/mL — ABNORMAL HIGH (ref 180–914)

## 2023-10-22 NOTE — Progress Notes (Signed)
 Hematology and Oncology Follow Up Visit  Brooke French [id number] 03/06/1961 63 y.o. 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 She is doing fairly well but continues to feel fatigued.  She has not noted any blood loss. No bruising or petechiae.  No fever, chills, n/v, cough, rash, dizziness, SOB, chest pain, palpitations, abdominal pain or changes in bowel or bladder habits.  No swelling, tenderness, numbness or tingling in her extremities.  No falls or syncope.  Appetite and hydration are good. Weight is 245 lbs.   ECOG Performance Status: 1 - Symptomatic but completely ambulatory  Medications:  Allergies as of 10/22/2023   No Known Allergies      Medication List        Accurate as of October 22, 2023  3:02 PM. If you have any questions, ask your nurse or doctor.          atorvastatin  20 MG tablet Commonly known as: Lipitor Take 1 tablet (20 mg total) by mouth daily.   betamethasone  dipropionate 0.05 % cream Apply 1 Application topically to arms and legs 2 (two) times daily.   Biotin 5000 MCG Caps Take by mouth daily.   buPROPion  150 MG 24 hr tablet Commonly known as: WELLBUTRIN  XL Take 1 tablet (150 mg total) by mouth daily.   diclofenac Sodium 1 % Gel Commonly known as: VOLTAREN Apply 2 g topically 4 (four) times daily.   docusate sodium  100 MG capsule Commonly known as: Colace Take 1 capsule (100 mg total) by mouth 2 (two) times daily. While taking narcotic pain medicine.   ergocalciferol  1.25 MG (50000 UT) capsule Commonly known as: VITAMIN D2 Take 1 capsule (50,000 Units total) by mouth once a week.   Vitamin D  (Ergocalciferol ) 1.25 MG (50000 UNIT) Caps capsule Commonly known as: DRISDOL  Take 1 capsule (50,000 Units  total) by mouth once a week.   escitalopram  10 MG tablet Commonly known as: LEXAPRO  Take 1 tablet (10 mg total) by mouth daily.   levothyroxine  125 MCG tablet Commonly known as: SYNTHROID  Take 1 tablet (125 mcg total) by mouth in the morning on an empty stomach.   meloxicam  7.5 MG tablet Commonly known as: MOBIC  Take 1 tablet (7.5 mg total) by mouth 2 (two) times daily as needed for pain   nystatin  powder Commonly known as: MYCOSTATIN /NYSTOP  Apply 1 Application topically 2 (two) times daily.   nystatin -triamcinolone  cream Commonly known as: MYCOLOG II Apply topically to affected area(s) at bedtime as needed.   temazepam  30 MG capsule Commonly known as: RESTORIL  Take 30 mg by mouth at bedtime as needed for sleep.   temazepam  30 MG capsule Commonly known as: RESTORIL  Take 1 capsule (30 mg total) by mouth at bedtime.   valACYclovir  500 MG tablet Commonly known as: VALTREX  Take 1 tablet (500 mg total) by mouth daily.        Allergies: No Known Allergies  Past Medical History, Surgical history, Social history, and Family History were reviewed and updated.  Review of Systems: All other 10 point review of systems is negative.   Physical Exam:  vitals were not taken for this visit.   Wt Readings from Last 3 Encounters:  04/20/23 231 lb (104.8 kg)  08/31/22 214  lb 8.1 oz (97.3 kg)  09/20/21 259 lb 12.8 oz (117.8 kg)    Ocular: Sclerae unicteric, pupils equal, round and reactive to light Ear-nose-throat: Oropharynx clear, dentition fair Lymphatic: No cervical or supraclavicular adenopathy Lungs no rales or rhonchi, good excursion bilaterally Heart regular rate and rhythm, no murmur appreciated Abd soft, nontender, positive bowel sounds MSK no focal spinal tenderness, no joint edema Neuro: non-focal, well-oriented, appropriate affect Breasts: Deferred   Lab Results  Component Value Date   WBC 5.5 04/04/2023   HGB 13.2 04/04/2023   HCT 39.8 04/04/2023   MCV  97.1 04/04/2023   PLT 216 04/04/2023   Lab Results  Component Value Date   FERRITIN 210 04/04/2023   IRON  77 04/04/2023   TIBC 386 04/04/2023   UIBC 309 04/04/2023   IRONPCTSAT 20 04/04/2023   Lab Results  Component Value Date   RETICCTPCT 1.8 03/13/2019   RBC 4.10 04/04/2023   No results found for: KPAFRELGTCHN, LAMBDASER, KAPLAMBRATIO No results found for: IGGSERUM, IGA, IGMSERUM No results found for: STEPHANY CARLOTA BENSON MARKEL EARLA JOANNIE DOC VICK, SPEI   Chemistry      Component Value Date/Time   NA 140 04/04/2023 1440   NA 151 (H) 09/05/2017 1445   K 4.2 04/04/2023 1440   K 4.3 09/05/2017 1445   CL 105 04/04/2023 1440   CL 104 09/05/2017 1445   CO2 30 04/04/2023 1440   CO2 31 09/05/2017 1445   BUN 25 (H) 04/04/2023 1440   BUN 19 09/05/2017 1445   CREATININE 0.66 04/04/2023 1440   CREATININE 0.9 09/05/2017 1445      Component Value Date/Time   CALCIUM  9.5 04/04/2023 1440   CALCIUM  9.6 09/05/2017 1445   ALKPHOS 104 04/04/2023 1440   ALKPHOS 108 (H) 09/05/2017 1445   AST 27 04/04/2023 1440   ALT 38 04/04/2023 1440   ALT 33 09/05/2017 1445   BILITOT 0.4 04/04/2023 1440       Impression and Plan: Brooke French is a very pleasant 63 yo caucasian female with iron  deficiency anemia secondary to malabsorption after gastric bypass.  Anemia work up and Vitamin D  pending. We will replace if needed.  Follow-up in 6 months.   Lauraine Pepper, NP 1/6/20253:02 PM

## 2023-10-23 LAB — IRON AND IRON BINDING CAPACITY (CC-WL,HP ONLY)
Iron: 75 ug/dL (ref 28–170)
Saturation Ratios: 18 % (ref 10.4–31.8)
TIBC: 428 ug/dL (ref 250–450)
UIBC: 353 ug/dL (ref 148–442)

## 2023-10-24 ENCOUNTER — Encounter: Payer: Self-pay | Admitting: *Deleted

## 2023-11-05 ENCOUNTER — Encounter: Payer: Self-pay | Admitting: Family

## 2023-11-09 DIAGNOSIS — M25512 Pain in left shoulder: Secondary | ICD-10-CM | POA: Diagnosis not present

## 2023-11-12 ENCOUNTER — Encounter: Payer: Self-pay | Admitting: Dermatology

## 2023-11-12 ENCOUNTER — Ambulatory Visit (INDEPENDENT_AMBULATORY_CARE_PROVIDER_SITE_OTHER): Payer: 59 | Admitting: Dermatology

## 2023-11-12 VITALS — BP 119/77 | HR 73

## 2023-11-12 DIAGNOSIS — L821 Other seborrheic keratosis: Secondary | ICD-10-CM

## 2023-11-12 DIAGNOSIS — L259 Unspecified contact dermatitis, unspecified cause: Secondary | ICD-10-CM

## 2023-11-12 DIAGNOSIS — L309 Dermatitis, unspecified: Secondary | ICD-10-CM

## 2023-11-12 NOTE — Progress Notes (Unsigned)
   New Patient Visit   Subjective  Brooke French is a 63 y.o. female who presents for the following: rash on bilateral lower legs Pt has rash on the legs for several months. It had flared significantly 7 months ago. Pt tried otc hydrocortisone which helped some and her pcp gave her betamethasone which helped calm it down-she's had to use it several times. The rash was better until a month ago when it re-flared. She restarted betamethasone and has been using only when she has bad flare which she thinks is maybe a couple times. When she is flaring she has an itch rate of 10 and stays at that for about an hour until topical takes effect.   The following portions of the chart were reviewed this encounter and updated as appropriate: medications, allergies, medical history  Review of Systems:  No other skin or systemic complaints except as noted in HPI or Assessment and Plan.  Objective  Well appearing patient in no apparent distress; mood and affect are within normal limits.  A focused examination was performed of the following areas: Bilateral lower legs  Relevant exam findings are noted in the Assessment and Plan.     Exam: Pink scaly plaque posterior fossa    Assessment & Plan   Dermatitis   Treatment Plan: -use fragrance free products such as dove sensitive liquid body wash. -cerave lotion or eucerin lotion after showeing. -continue betamethasone to affected area twice a day for no more then 2 weeks     No follow-ups on file.  Owens Shark, CMA, am acting as scribe for [employer], DO.   Documentation: I have reviewed the above documentation for accuracy and completeness, and I agree with the above.  Langston Reusing, DO

## 2023-11-12 NOTE — Patient Instructions (Addendum)
Hello Miss President,  Thank you for visiting [place] today.  Below are the key instructions and recommendations from our consultation:  Topical Treatment: Continue with the Betamethasone cream regimen.   Application: Apply the cream twice daily.   Duration: Use for up to 2 weeks. If symptoms improve within 2-3 days, you may discontinue use early. However, ensure to give your skin a break after 2 weeks to avoid potential thinning and stretch marks.  Skin Care Products: Opt for fragrance-free products to minimize irritation.   Recommendation: Dove Sensitive Liquid [employer] is advised due to its lack of added fragrances.  Moisturizing: It's crucial to regularly moisturize to maintain a healthy skin barrier.   Products: Suitable options include Excedrin or CeraVe, which should be applied after showering.  Follow-Up Appointment: We should follow up in July or August to assess your progress and discuss any necessary adjustments to your treatment plan.  Should you have any questions or notice any changes in your symptoms before our next scheduled meeting, please do not hesitate to reach out.  Warm regards,  Dr. Langston Reusing, Dermatology    Important Information   Due to recent changes in healthcare laws, you may see results of your pathology and/or laboratory studies on MyChart before the doctors have had a chance to review them. We understand that in some cases there may be results that are confusing or concerning to you. Please understand that not all results are received at the same time and often the doctors may need to interpret multiple results in order to provide you with the best plan of care or course of treatment. Therefore, we ask that you please give [place] 2 business days to thoroughly review all your results before contacting the office for clarification. Should we see a critical lab result, you will be contacted sooner.     If You Need Anything After Your Visit   If you have any  questions or concerns for your doctor, please call our main line at [phone]. If no one answers, please leave a voicemail as directed and we will return your call as soon as possible. Messages left after 4 pm will be answered the following business day.    You may also send [place] a message via MyChart. We typically respond to MyChart messages within 1-2 business days.  For prescription refills, please ask your pharmacy to contact our office. Our fax number is [phone].  If you have an urgent issue when the clinic is closed that cannot wait until the next business day, you can page your doctor at the number below.     Please note that while we do our best to be available for urgent issues outside of office hours, we are not available 24/7.    If you have an urgent issue and are unable to reach [place], you may choose to seek medical care at your doctor's office, retail clinic, urgent care center, or emergency room.   If you have a medical emergency, please immediately call 911 or go to the emergency department. In the event of inclement weather, please call our main line at [phone] for an update on the status of any delays or closures.  Dermatology Medication Tips: Please keep the boxes that topical medications come in in order to help keep track of the instructions about where and how to use these. Pharmacies typically print the medication instructions only on the boxes and not directly on the medication tubes.   If your medication is  too expensive, please contact our office at [phone] or send [place] a message through MyChart.    We are unable to tell what your co-pay for medications will be in advance as this is different depending on your insurance coverage. However, we may be able to find a substitute medication at lower cost or fill out paperwork to get insurance to cover a needed medication.    If a prior authorization is required to get your medication covered by your insurance  company, please allow [place] 1-2 business days to complete this process.   Drug prices often vary depending on where the prescription is filled and some pharmacies may offer cheaper prices.   The website www.goodrx.com contains coupons for medications through different pharmacies. The prices here do not account for what the cost may be with help from insurance (it may be cheaper with your insurance), but the website can give you the price if you did not use any insurance.  - You can print the associated coupon and take it with your prescription to the pharmacy.  - You may also stop by our office during regular business hours and pick up a GoodRx coupon card.  - If you need your prescription sent electronically to a different pharmacy, notify our office through [facility] or by phone at [phone]

## 2023-11-26 ENCOUNTER — Other Ambulatory Visit (HOSPITAL_COMMUNITY): Payer: Self-pay

## 2023-11-26 MED ORDER — TEMAZEPAM 30 MG PO CAPS
30.0000 mg | ORAL_CAPSULE | Freq: Every day | ORAL | 0 refills | Status: DC
  Filled 2023-11-26: qty 90, 90d supply, fill #0

## 2023-11-26 MED ORDER — VALACYCLOVIR HCL 500 MG PO TABS
500.0000 mg | ORAL_TABLET | Freq: Every day | ORAL | 0 refills | Status: DC
  Filled 2023-11-26: qty 90, 90d supply, fill #0

## 2023-12-12 DIAGNOSIS — Z6841 Body Mass Index (BMI) 40.0 and over, adult: Secondary | ICD-10-CM | POA: Diagnosis not present

## 2023-12-12 DIAGNOSIS — Z01419 Encounter for gynecological examination (general) (routine) without abnormal findings: Secondary | ICD-10-CM | POA: Diagnosis not present

## 2023-12-12 DIAGNOSIS — Z1272 Encounter for screening for malignant neoplasm of vagina: Secondary | ICD-10-CM | POA: Diagnosis not present

## 2023-12-14 ENCOUNTER — Other Ambulatory Visit (HOSPITAL_COMMUNITY): Payer: Self-pay

## 2023-12-14 DIAGNOSIS — L84 Corns and callosities: Secondary | ICD-10-CM | POA: Diagnosis not present

## 2023-12-14 DIAGNOSIS — M79672 Pain in left foot: Secondary | ICD-10-CM | POA: Diagnosis not present

## 2023-12-14 MED ORDER — D 5000 125 MCG (5000 UT) PO CAPS: 5000.0000 [IU] | ORAL_CAPSULE | Freq: Every day | ORAL | 0 refills | Status: AC

## 2024-01-15 ENCOUNTER — Other Ambulatory Visit (HOSPITAL_COMMUNITY): Payer: Self-pay

## 2024-01-17 ENCOUNTER — Encounter (HOSPITAL_BASED_OUTPATIENT_CLINIC_OR_DEPARTMENT_OTHER): Payer: Self-pay | Admitting: Orthopaedic Surgery

## 2024-01-17 ENCOUNTER — Encounter: Payer: Self-pay | Admitting: Family

## 2024-01-17 ENCOUNTER — Other Ambulatory Visit (HOSPITAL_COMMUNITY): Payer: Self-pay

## 2024-01-17 MED ORDER — ACETAMINOPHEN EXTRA STRENGTH 500 MG PO TABS
1000.0000 mg | ORAL_TABLET | Freq: Three times a day (TID) | ORAL | 0 refills | Status: AC | PRN
  Filled 2024-01-17: qty 100, 17d supply, fill #0

## 2024-01-17 MED ORDER — OXYCODONE HCL 5 MG PO TABS
5.0000 mg | ORAL_TABLET | Freq: Four times a day (QID) | ORAL | 0 refills | Status: AC | PRN
  Filled 2024-01-17: qty 30, 4d supply, fill #0

## 2024-01-17 MED ORDER — ONDANSETRON 4 MG PO TBDP
4.0000 mg | ORAL_TABLET | Freq: Three times a day (TID) | ORAL | 0 refills | Status: AC | PRN
  Filled 2024-01-17: qty 10, 4d supply, fill #0

## 2024-01-17 MED ORDER — METHOCARBAMOL 500 MG PO TABS
500.0000 mg | ORAL_TABLET | Freq: Three times a day (TID) | ORAL | 0 refills | Status: AC | PRN
  Filled 2024-01-17: qty 30, 10d supply, fill #0

## 2024-01-17 MED ORDER — CELECOXIB 200 MG PO CAPS
200.0000 mg | ORAL_CAPSULE | Freq: Two times a day (BID) | ORAL | 2 refills | Status: AC
  Filled 2024-01-17: qty 60, 30d supply, fill #0
  Filled 2024-04-08: qty 60, 30d supply, fill #1
  Filled 2024-06-13: qty 60, 30d supply, fill #2

## 2024-01-23 NOTE — H&P (Signed)
 PREOPERATIVE H&P  Chief Complaint: left shoulder cartilage disorder, OA, impingement syndrome,biceps tendinitis, rotator cuff tear  HPI: Brooke French is a 63 y.o. female who is scheduled for, Procedure(s): SHOULDER ARTHROSCOPY WITH SUBACROMIAL DECOMPRESSION, ROTATOR CUFF REPAIR AND BICEP TENDON REPAIR SHOULDER ARTHROSCOPY WITH DISTAL CLAVICLE EXCISION.   Patient has a past medical history significant for hypothyroidism.   Patient has had left shoulder pain for quite some time. She has tried and failed conservative measures with injections and antiinflammatories.   Symptoms are rated as moderate to severe, and have been worsening.  This is significantly impairing activities of daily living.    Please see clinic note for further details on this patient's care.    She has elected for surgical management.   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/2015   Rotator cuff tear 05/17/2015   right shoulder   Past Surgical History:  Procedure Laterality Date   BREAST REDUCTION SURGERY  1994   CARPAL TUNNEL RELEASE Right 01/02/2014   Procedure: RIGHT CARPAL TUNNEL RELEASE;  Surgeon: Nicki Reaper, MD;  Location: [facility];  Service: Orthopedics;  Laterality: Right;   CESAREAN SECTION  1996   ENDOSCOPIC PLANTAR FASCIOTOMY Right 06/29/2000   FOOT ARTHRODESIS Left 08/31/2022   Procedure: ARTHRODESIS CALCANEOCUBOID JOINT;  CALCANEUS AUTOGRAFT;  Surgeon: Toni Arthurs, MD;  Location: [facility];  Service: Orthopedics;  Laterality: Left;   FOOT SURGERY Bilateral    KNEE ARTHROSCOPY W/ ACL RECONSTRUCTION Left  11/16/2011   PLANTAR FASCIA RELEASE Left 01/22/2003   PUBOVAGINAL SLING  09/08/2002   REDUCTION MAMMAPLASTY Bilateral 1994   REPAIR OF PERONEUS BREVIS TENDON Left 08/31/2022   Procedure: PERONEUS BREVIS TENDON REPAIR AND PERONEUS LONGUS TENDON DEBRIDEMENT;  Surgeon: Toni Arthurs, MD;  Location: [facility];  Service: Orthopedics;  Laterality: Left;   RESECTION DISTAL CLAVICAL Right 06/03/2015   Procedure: RESECTION DISTAL CLAVICAL;  Surgeon: Loreta Ave, MD;  Location: [facility];  Service: Orthopedics;  Laterality: Right;   ROUX-EN-Y GASTRIC BYPASS  10/18/2004   SHOULDER ARTHROSCOPY WITH ROTATOR CUFF REPAIR AND SUBACROMIAL DECOMPRESSION Right 06/03/2015   Procedure: SHOULDER ARTHROSCOPY WITH ROTATOR CUFF REPAIR AND SUBACROMIAL DECOMPRESSION;  Surgeon: Loreta Ave, MD;  Location: [facility];  Service: Orthopedics;  Laterality: Right;   STERIOD INJECTION Left 01/02/2014   Procedure: STEROID INJECTION LEFT WRIST  (CARPAL TUNNEL);  Surgeon: Nicki Reaper, MD;  Location:  SURGERY CENTER;  Service: Orthopedics;  Laterality: Left;   UPPER GI ENDOSCOPY  10/18/2004   VAGINAL HYSTERECTOMY  09/09/2002   Social History   Socioeconomic History   Marital status: Married    Spouse name: Not on file   Number of children: Not on file   Years of education: Not on file   Highest education level: Not on file  Occupational History   Not on file  Tobacco Use   Smoking status: Former   Smokeless tobacco: Never   Tobacco comments:    quit smoking in 2005  Vaping Use   Vaping status: Never Used  Substance and Sexual Activity   Alcohol use: Yes    Comment: social   Drug use: No   Sexual activity: Not on file    Comment: hyst  Other Topics Concern   Not on file  Social History Narrative   Not on file   Social Drivers of Health   Financial Resource Strain: Not on file  Food Insecurity: Not on file  Transportation Needs: Not on file   Physical Activity: Not on file  Stress: Not on file  Social Connections: Not on file   History reviewed. No pertinent family history. No Known Allergies Prior to Admission medications   Medication Sig Start Date End Date Taking? Authorizing Provider  atorvastatin (LIPITOR) 20 MG tablet Take 1 tablet (20 mg total) by mouth daily. 10/18/23  Yes   betamethasone dipropionate 0.05 % cream Apply 1 Application topically to arms and legs 2 (two) times daily. 08/20/23  Yes   Biotin 5000 MCG CAPS Take by mouth daily.   Yes [provider]  buPROPion (WELLBUTRIN XL) 150 MG 24 hr tablet Take 1 tablet (150 mg total) by mouth daily. 08/15/23  Yes   Cholecalciferol (D 5000) 125 MCG (5000 UT) capsule Take 1 capsule (5,000 Units total) by mouth daily. 12/14/23  Yes   diclofenac Sodium (VOLTAREN) 1 % GEL Apply 2 g topically 4 (four) times daily. 05/04/20  Yes [provider]  ergocalciferol (VITAMIN D2) 1.25 MG (50000 UT) capsule Take 1 capsule (50,000 Units total) by mouth once a week. 05/13/20  Yes Erenest Blank, NP  escitalopram (LEXAPRO) 10 MG tablet Take 1 tablet (10 mg total) by mouth daily. 08/06/23  Yes   levothyroxine (SYNTHROID) 125 MCG tablet Take 1 tablet (125 mcg total) by mouth in the morning on an empty stomach. 08/10/23  Yes   meloxicam (MOBIC) 7.5 MG tablet Take 1 tablet (7.5 mg total) by mouth 2 (two) times daily as needed for pain 07/06/22  Yes   temazepam (RESTORIL) 30 MG capsule Take 30 mg by mouth at bedtime as needed for sleep.   Yes [provider]  temazepam (RESTORIL) 30 MG capsule Take 1 capsule (30 mg total) by mouth at bedtime. 11/26/23  Yes   Vitamin D, Ergocalciferol, (DRISDOL) 1.25 MG (50000 UNIT) CAPS capsule Take 1 capsule (50,000 Units total) by mouth once a week. 05/23/23  Yes Josph Macho, MD  Acetaminophen Extra Strength 500 MG TABS Take 2 tablets (1,000 mg total) by mouth every 8 (eight) hours as needed. 01/17/24     celecoxib (CELEBREX) 200 MG  capsule Take 1 capsule by mouth twice a day 01/17/24     docusate sodium (COLACE) 100 MG capsule Take 1 capsule (100 mg total) by mouth 2 (two) times daily. While taking narcotic pain medicine. 08/31/22   Jacinta Shoe, PA-C  methocarbamol (ROBAXIN) 500 MG tablet Take 1 tablet (500 mg total) by mouth every 8 (eight) hours as needed for muscle spasms. 01/17/24     nystatin (MYCOSTATIN/NYSTOP) powder Apply 1 Application topically 2 (two) times daily. 05/22/22     nystatin-triamcinolone (MYCOLOG II) cream Apply topically to affected area(s) at bedtime as needed. 05/10/23     ondansetron (ZOFRAN-ODT) 4 MG disintegrating tablet Take 1 tablet (4 mg total) by mouth every 8 (eight) hours as needed for nausea/vomiting after surgery. 01/17/24     oxyCODONE (OXY IR/ROXICODONE) 5 MG immediate release tablet Take 1-2 tablets (5-10 mg total) by mouth every 6 (six) hours as needed for pain. No  more than 6 pills a day. 01/17/24     valACYclovir (VALTREX) 500 MG tablet Take 1 tablet (500 mg total) by mouth daily. 11/26/23       ROS: All other systems have been reviewed and were otherwise negative with the exception of those mentioned in the HPI and as above.  Physical Exam: General: Alert, no acute distress Cardiovascular: No pedal edema Respiratory: No cyanosis, no use of accessory musculature GI: No organomegaly, abdomen is soft and non-tender Skin: No lesions in the area of chief complaint Neurologic: Sensation intact distally Psychiatric: Patient is competent for consent with normal mood and affect Lymphatic: No axillary or cervical lymphadenopathy  MUSCULOSKELETAL:  LUE: painful ROM. Active forward elevation to 150 degrees. External rotation 30 degrees. 4/5 supraspinatus testing. Positive O'Briens. She is TTP bicipital groove. Positive impingement and positive AC tenderness to palpation.   Imaging: MRI demonstrates high grade articular sided rotator cuff tear, fluid around the biceps, gross edema in the  distal clavicle, lateral hanging acromial spur  Assessment: left shoulder cartilage disorder, OA, impingement syndrome,biceps tendinitis, rotator cuff tear  Plan: Plan for Procedure(s): SHOULDER ARTHROSCOPY WITH SUBACROMIAL DECOMPRESSION, ROTATOR CUFF REPAIR AND BICEP TENDON REPAIR SHOULDER ARTHROSCOPY WITH DISTAL CLAVICLE EXCISION   The risks benefits and alternatives were discussed with the patient including but not limited to the risks of nonoperative treatment, versus surgical intervention including infection, bleeding, nerve injury,  blood clots, cardiopulmonary complications, morbidity, mortality, among others, and they were willing to proceed.   The patient acknowledged the explanation, agreed to proceed with the plan and consent was signed.   Operative Plan: Left shoulder arthroscopy with SAD, DCE, BT, RCR Discharge Medications: standard (medications sent last week) DVT Prophylaxis: none Physical Therapy: outpatient PT Special Discharge needs: Sling.    Vernetta Honey, PA-C  01/23/2024 11:52 AM

## 2024-01-23 NOTE — Discharge Instructions (Signed)
 Ramond Marrow MD, MPH Alfonse Alpers, PA-C [facility] [address], [address] [phone] (tel)   [phone] (fax)   POST-OPERATIVE INSTRUCTIONS - SHOULDER ARTHROSCOPY  WOUND CARE You may remove the Operative Dressing on Post-Op Day #3 (72hrs after surgery).   Alternatively if you would like you can leave dressing on until follow-up if within 7-8 days but keep it dry. Leave steri-strips in place until they fall off on their own, usually 2 weeks postop. There may be a small amount of fluid/bleeding leaking at the surgical site.  This is normal; the shoulder is filled with fluid during the procedure and can leak for 24-48hrs after surgery.  You may change/reinforce the bandage as needed.  Use the Cryocuff or Ice as often as possible for the first 7 days, then as needed for pain relief. Always keep a towel, ACE wrap or other barrier between the cooling unit and your skin.  You may shower on Post-Op Day #3. Gently pat the area dry.  Do not soak the shoulder in water or submerge it.  Keep incisions as dry as possible. Do not go swimming in the pool or ocean until 4 weeks after surgery or when otherwise instructed.    EXERCISES Wear the sling at all times  You may remove the sling for showering, but keep the arm across the chest or in a secondary sling.     It is normal for your fingers/hand to become more swollen after surgery and discolored from bruising.   This will resolve over the first few weeks usually after surgery. Please continue to ambulate and do not stay sitting or lying for too long.  Perform foot and wrist pumps to assist in circulation.  PHYSICAL THERAPY - You will begin physical therapy soon after surgery (unless otherwise specified) - Please call to set up an appointment, if you do not already have one  - Let our office if there are any issues with scheduling your therapy   - A PT referral was sent to [facility] on [address]  REGIONAL ANESTHESIA (NERVE BLOCKS) The anesthesia team may have performed a nerve block for you this is a great tool used to minimize pain.   The block may start wearing off overnight (between 8-24 hours postop) When the block wears off, your pain may go from nearly zero to the pain you would have had postop without the block. This is an abrupt transition but nothing dangerous is happening.   This can be a challenging period but utilize your as needed pain medications to try and manage this period. We suggest you use the pain medication the first night prior to going to bed, to ease this transition.  You may take an extra dose of narcotic when this happens if needed  POST-OP MEDICATIONS- Multimodal approach to pain control In general your pain will be controlled with a combination of substances.  Prescriptions unless otherwise discussed are electronically sent to your pharmacy.  This is a carefully made plan we use to minimize narcotic use.     Celebrex - Anti-inflammatory medication taken on a scheduled basis Acetaminophen - Non-narcotic pain medicine taken on a scheduled basis  Oxycodone - This is a strong narcotic, to be used only on an "as needed" basis for SEVERE pain. Robaxin - this is a muscle relaxer, take as needed for muscle spasms Zofran - take as needed for nausea  *meds sent last week*  FOLLOW-UP If you develop a  Fever (>=101.5), Redness or Drainage from the surgical incision site, please call our office to arrange for an evaluation. Please call the office to schedule a follow-up appointment for your first post-operative appointment, 7-10 days post-operatively.    HELPFUL INFORMATION   You may be more comfortable sleeping in a semi-seated position the first few nights following surgery.  Keep a pillow propped under the elbow and forearm for comfort.  If you have a recliner type of chair it might be beneficial.  If not that is fine too, but it would be helpful to sleep  propped up with pillows behind your operated shoulder as well under your elbow and forearm.  This will reduce pulling on the suture lines.  When dressing, put your operative arm in the sleeve first.  When getting undressed, take your operative arm out last.  Loose fitting, button-down shirts are recommended.  Often in the first days after surgery you may be more comfortable keeping your operative arm under your shirt and not through the sleeve.  You may return to work/school in the next couple of days when you feel up to it.  Desk work and typing in the sling is fine.  We suggest you use the pain medication the first night prior to going to bed, in order to ease any pain when the anesthesia wears off. You should avoid taking pain medications on an empty stomach as it will make you nauseous.  You should wean off your narcotic medicines as soon as you are able.  Most patients will be off narcotics before their first postop appointment.   Do not drink alcoholic beverages or take illicit drugs when taking pain medications.  It is against the law to drive while taking narcotics.  In some states it is against the law to drive while your arm is in a sling.   Pain medication may make you constipated.  Below are a few solutions to try in this order: Decrease the amount of pain medication if you aren't having pain. Drink lots of decaffeinated fluids. Drink prune juice and/or eat dried prunes  If the first 3 don't work start with additional solutions Take Colace - an over-the-counter stool softener Take Senokot - an over-the-counter laxative Take Miralax - a stronger over-the-counter laxative  For more information including helpful videos and documents visit our website:   [email or web address]    Post Anesthesia Home Care Instructions  Activity: Get plenty of rest for the remainder of the day. A responsible individual must stay with you for 24 hours following the  procedure.  For the next 24 hours, DO NOT: -Drive a car -Advertising copywriter -Drink alcoholic beverages -Take any medication unless instructed by your physician -Make any legal decisions or sign important papers.  Meals: Start with liquid foods such as gelatin or soup. Progress to regular foods as tolerated. Avoid greasy, spicy, heavy foods. If nausea and/or vomiting occur, drink only clear liquids until the nausea and/or vomiting subsides. Call your physician if vomiting continues.  Special Instructions/Symptoms: Your throat may feel dry or sore from the anesthesia or the breathing tube placed in your throat during surgery. If this causes discomfort, gargle with warm salt water. The discomfort should disappear within 24 hours.  May have Tylenol after 12:30pm if needed.  Regional Anesthesia Blocks  1. You may not be able to move or feel the "blocked" extremity after a regional anesthetic block. This may last may last from 3-48 hours after placement, but it will go away. The  length of time depends on the medication injected and your individual response to the medication. As the nerves start to wake up, you may experience tingling as the movement and feeling returns to your extremity. If the numbness and inability to move your extremity has not gone away after 48 hours, please call your surgeon.   2. The extremity that is blocked will need to be protected until the numbness is gone and the strength has returned. Because you cannot feel it, you will need to take extra care to avoid injury. Because it may be weak, you may have difficulty moving it or using it. You may not know what position it is in without looking at it while the block is in effect.  3. For blocks in the legs and feet, returning to weight bearing and walking needs to be done carefully. You will need to wait until the numbness is entirely gone and the strength has returned. You should be able to move your leg and foot normally before  you try and bear weight or walk. You will need someone to be with you when you first try to ensure you do not fall and possibly risk injury.  4. Bruising and tenderness at the needle site are common side effects and will resolve in a few days.  5. Persistent numbness or new problems with movement should be communicated to the surgeon or the [facility] [phone] [facility] [phone]).     Information for Discharge Teaching: EXPAREL (bupivacaine liposome injectable suspension)   Pain relief is important to your recovery. The goal is to control your pain so you can move easier and return to your normal activities as soon as possible after your procedure. Your physician may use several types of medicines to manage pain, swelling, and more.  Your surgeon or anesthesiologist gave you EXPAREL(bupivacaine) to help control your pain after surgery.  EXPAREL is a local anesthetic designed to release slowly over an extended period of time to provide pain relief by numbing the tissue around the surgical site. EXPAREL is designed to release pain medication over time and can control pain for up to 72 hours. Depending on how you respond to EXPAREL, you may require less pain medication during your recovery. EXPAREL can help reduce or eliminate the need for opioids during the first few days after surgery when pain relief is needed the most. EXPAREL is not an opioid and is not addictive. It does not cause sleepiness or sedation.   Important! A teal colored band has been placed on your arm with the date, time and amount of EXPAREL you have received. Please leave this armband in place for the full 96 hours following administration, and then you may remove the band. If you return to the hospital for any reason within 96 hours following the administration of EXPAREL, the armband provides important information that your health care providers to know, and alerts them that you have  received this anesthetic.    Possible side effects of EXPAREL: Temporary loss of sensation or ability to move in the area where medication was injected. Nausea, vomiting, constipation Rarely, numbness and tingling in your mouth or lips, lightheadedness, or anxiety may occur. Call your doctor right away if you think you may be experiencing any of these sensations, or if you have other questions regarding possible side effects.  Follow all other discharge instructions given to you by your surgeon or nurse. Eat a healthy diet and drink plenty of  water or other fluids.[employer]Joy Ultrasling IV/Ultrasling IIIER:  Please contact your surgeon if you have questions or concerns about your sling.

## 2024-01-23 NOTE — Anesthesia Preprocedure Evaluation (Signed)
 Anesthesia Evaluation  Patient identified by MRN, date of birth, ID band Patient awake    Reviewed: Allergy & Precautions, NPO status , Patient's Chart, lab work & pertinent test results  Airway Mallampati: II  TM Distance: >3 FB Neck ROM: Full    Dental no notable dental hx.    Pulmonary former smoker   Pulmonary exam normal        Cardiovascular negative cardio ROS Normal cardiovascular exam     Neuro/Psych  PSYCHIATRIC DISORDERS Anxiety Depression    negative neurological ROS     GI/Hepatic negative GI ROS, Neg liver ROS,,,  Endo/Other  Hypothyroidism  Class 3 obesity  Renal/GU negative Renal ROS     Musculoskeletal  (+) Arthritis ,    Abdominal  (+) + obese  Peds  Hematology negative hematology ROS (+)   Anesthesia Other Findings left shoulder cartilage disorder, OA, impingement syndrome,biceps tendinitis, rotator cuff tear  Reproductive/Obstetrics                             Anesthesia Physical Anesthesia Plan  ASA: 3  Anesthesia Plan: General and Regional   Post-op Pain Management: Regional block*   Induction: Intravenous  PONV Risk Score and Plan: 3 and Ondansetron, Dexamethasone, Midazolam and Treatment may vary due to age or medical condition  Airway Management Planned: Oral ETT  Additional Equipment:   Intra-op Plan:   Post-operative Plan: Extubation in OR  Informed Consent: I have reviewed the patients History and Physical, chart, labs and discussed the procedure including the risks, benefits and alternatives for the proposed anesthesia with the patient or authorized representative who has indicated his/her understanding and acceptance.     Dental advisory given  Plan Discussed with: CRNA  Anesthesia Plan Comments:        Anesthesia Quick Evaluation

## 2024-01-24 ENCOUNTER — Ambulatory Visit (HOSPITAL_BASED_OUTPATIENT_CLINIC_OR_DEPARTMENT_OTHER): Payer: Self-pay | Admitting: Anesthesiology

## 2024-01-24 ENCOUNTER — Encounter (HOSPITAL_BASED_OUTPATIENT_CLINIC_OR_DEPARTMENT_OTHER): Payer: Self-pay | Admitting: Orthopaedic Surgery

## 2024-01-24 ENCOUNTER — Other Ambulatory Visit: Payer: Self-pay

## 2024-01-24 ENCOUNTER — Encounter (HOSPITAL_BASED_OUTPATIENT_CLINIC_OR_DEPARTMENT_OTHER)
Admission: RE | Disposition: A | Discharge status: Home / Self Care | Payer: Self-pay | Source: Home / Self Care | Attending: Orthopaedic Surgery

## 2024-01-24 ENCOUNTER — Ambulatory Visit (HOSPITAL_BASED_OUTPATIENT_CLINIC_OR_DEPARTMENT_OTHER)
Admission: RE | Admit: 2024-01-24 | Discharge: 2024-01-24 | Disposition: A | Discharge status: Home / Self Care | Payer: 59 | Attending: Orthopaedic Surgery | Admitting: Orthopaedic Surgery

## 2024-01-24 DIAGNOSIS — Z6841 Body Mass Index (BMI) 40.0 and over, adult: Secondary | ICD-10-CM | POA: Insufficient documentation

## 2024-01-24 DIAGNOSIS — F32A Depression, unspecified: Secondary | ICD-10-CM | POA: Insufficient documentation

## 2024-01-24 DIAGNOSIS — Z79899 Other long term (current) drug therapy: Secondary | ICD-10-CM | POA: Diagnosis not present

## 2024-01-24 DIAGNOSIS — M75122 Complete rotator cuff tear or rupture of left shoulder, not specified as traumatic: Secondary | ICD-10-CM | POA: Insufficient documentation

## 2024-01-24 DIAGNOSIS — X58XXXA Exposure to other specified factors, initial encounter: Secondary | ICD-10-CM | POA: Insufficient documentation

## 2024-01-24 DIAGNOSIS — M24112 Other articular cartilage disorders, left shoulder: Secondary | ICD-10-CM | POA: Diagnosis not present

## 2024-01-24 DIAGNOSIS — Z791 Long term (current) use of non-steroidal anti-inflammatories (NSAID): Secondary | ICD-10-CM | POA: Diagnosis not present

## 2024-01-24 DIAGNOSIS — E039 Hypothyroidism, unspecified: Secondary | ICD-10-CM | POA: Insufficient documentation

## 2024-01-24 DIAGNOSIS — Z9884 Bariatric surgery status: Secondary | ICD-10-CM | POA: Insufficient documentation

## 2024-01-24 DIAGNOSIS — M7542 Impingement syndrome of left shoulder: Secondary | ICD-10-CM

## 2024-01-24 DIAGNOSIS — S43432A Superior glenoid labrum lesion of left shoulder, initial encounter: Secondary | ICD-10-CM | POA: Insufficient documentation

## 2024-01-24 DIAGNOSIS — E66813 Obesity, class 3: Secondary | ICD-10-CM | POA: Diagnosis not present

## 2024-01-24 DIAGNOSIS — M19012 Primary osteoarthritis, left shoulder: Secondary | ICD-10-CM | POA: Diagnosis not present

## 2024-01-24 DIAGNOSIS — F419 Anxiety disorder, unspecified: Secondary | ICD-10-CM | POA: Diagnosis not present

## 2024-01-24 DIAGNOSIS — Z79624 Long term (current) use of inhibitors of nucleotide synthesis: Secondary | ICD-10-CM | POA: Insufficient documentation

## 2024-01-24 DIAGNOSIS — M7522 Bicipital tendinitis, left shoulder: Secondary | ICD-10-CM | POA: Diagnosis not present

## 2024-01-24 DIAGNOSIS — Z87891 Personal history of nicotine dependence: Secondary | ICD-10-CM | POA: Diagnosis not present

## 2024-01-24 DIAGNOSIS — M75102 Unspecified rotator cuff tear or rupture of left shoulder, not specified as traumatic: Secondary | ICD-10-CM

## 2024-01-24 DIAGNOSIS — G8918 Other acute postprocedural pain: Secondary | ICD-10-CM | POA: Diagnosis not present

## 2024-01-24 DIAGNOSIS — S46012A Strain of muscle(s) and tendon(s) of the rotator cuff of left shoulder, initial encounter: Secondary | ICD-10-CM | POA: Diagnosis not present

## 2024-01-24 SURGERY — SHOULDER ARTHROSCOPY WITH SUBACROMIAL DECOMPRESSION, ROTATOR CUFF REPAIR AND BICEP TENDON REPAIR
Anesthesia: Regional | Site: Shoulder | Laterality: Left

## 2024-01-24 MED ORDER — FENTANYL CITRATE (PF) 100 MCG/2ML IJ SOLN: 25.0000 ug | INTRAMUSCULAR | Status: DC | PRN

## 2024-01-24 MED ORDER — ACETAMINOPHEN 500 MG PO TABS
ORAL_TABLET | ORAL | Status: AC
  Filled 2024-01-24: qty 2

## 2024-01-24 MED ORDER — TRANEXAMIC ACID-NACL 1000-0.7 MG/100ML-% IV SOLN
1000.0000 mg | INTRAVENOUS | Status: AC
  Administered 2024-01-24: 1000 mg via INTRAVENOUS

## 2024-01-24 MED ORDER — MIDAZOLAM HCL 2 MG/2ML IJ SOLN
INTRAMUSCULAR | Status: AC
  Filled 2024-01-24: qty 2

## 2024-01-24 MED ORDER — ROCURONIUM BROMIDE 100 MG/10ML IV SOLN
INTRAVENOUS | Status: DC | PRN
  Administered 2024-01-24: 40 mg via INTRAVENOUS

## 2024-01-24 MED ORDER — DEXAMETHASONE SODIUM PHOSPHATE 10 MG/ML IJ SOLN
INTRAMUSCULAR | Status: DC | PRN
  Administered 2024-01-24: 10 mg via INTRAVENOUS

## 2024-01-24 MED ORDER — TRANEXAMIC ACID-NACL 1000-0.7 MG/100ML-% IV SOLN
INTRAVENOUS | Status: AC
  Filled 2024-01-24: qty 100

## 2024-01-24 MED ORDER — EPHEDRINE SULFATE (PRESSORS) 50 MG/ML IJ SOLN
INTRAMUSCULAR | Status: DC | PRN
  Administered 2024-01-24: 10 mg via INTRAVENOUS
  Administered 2024-01-24: 15 mg via INTRAVENOUS
  Administered 2024-01-24: 10 mg via INTRAVENOUS

## 2024-01-24 MED ORDER — GABAPENTIN 300 MG PO CAPS
300.0000 mg | ORAL_CAPSULE | Freq: Once | ORAL | Status: AC
  Administered 2024-01-24: 300 mg via ORAL

## 2024-01-24 MED ORDER — PROPOFOL 10 MG/ML IV BOLUS
INTRAVENOUS | Status: AC
  Filled 2024-01-24: qty 20

## 2024-01-24 MED ORDER — BUPIVACAINE LIPOSOME 1.3 % IJ SUSP
INTRAMUSCULAR | Status: DC | PRN
  Administered 2024-01-24: 10 mL via PERINEURAL

## 2024-01-24 MED ORDER — FENTANYL CITRATE (PF) 100 MCG/2ML IJ SOLN
INTRAMUSCULAR | Status: AC
  Filled 2024-01-24: qty 2

## 2024-01-24 MED ORDER — DEXAMETHASONE SODIUM PHOSPHATE 10 MG/ML IJ SOLN
INTRAMUSCULAR | Status: AC
  Filled 2024-01-24: qty 1

## 2024-01-24 MED ORDER — PROPOFOL 10 MG/ML IV BOLUS
INTRAVENOUS | Status: DC | PRN
  Administered 2024-01-24: 150 mg via INTRAVENOUS
  Administered 2024-01-24: 50 mg via INTRAVENOUS

## 2024-01-24 MED ORDER — ONDANSETRON HCL 4 MG/2ML IJ SOLN
INTRAMUSCULAR | Status: DC | PRN
  Administered 2024-01-24: 4 mg via INTRAVENOUS

## 2024-01-24 MED ORDER — AMISULPRIDE (ANTIEMETIC) 5 MG/2ML IV SOLN: 10.0000 mg | Freq: Once | INTRAVENOUS | Status: DC | PRN

## 2024-01-24 MED ORDER — EPINEPHRINE PF 1 MG/ML IJ SOLN
INTRAMUSCULAR | Status: AC
  Filled 2024-01-24: qty 2

## 2024-01-24 MED ORDER — LIDOCAINE 2% (20 MG/ML) 5 ML SYRINGE
INTRAMUSCULAR | Status: DC | PRN
  Administered 2024-01-24: 40 mg via INTRAVENOUS

## 2024-01-24 MED ORDER — KETOROLAC TROMETHAMINE 15 MG/ML IJ SOLN: 15.0000 mg | Freq: Once | INTRAMUSCULAR | Status: DC | PRN

## 2024-01-24 MED ORDER — SODIUM CHLORIDE 0.9 % IR SOLN
Status: DC | PRN
  Administered 2024-01-24: 16000 mL

## 2024-01-24 MED ORDER — CEFAZOLIN SODIUM-DEXTROSE 2-4 GM/100ML-% IV SOLN
2.0000 g | INTRAVENOUS | Status: AC
  Administered 2024-01-24: 2 g via INTRAVENOUS

## 2024-01-24 MED ORDER — ACETAMINOPHEN 500 MG PO TABS
1000.0000 mg | ORAL_TABLET | Freq: Once | ORAL | Status: AC
  Administered 2024-01-24: 1000 mg via ORAL

## 2024-01-24 MED ORDER — LIDOCAINE 2% (20 MG/ML) 5 ML SYRINGE
INTRAMUSCULAR | Status: AC
  Filled 2024-01-24: qty 5

## 2024-01-24 MED ORDER — ONDANSETRON HCL 4 MG/2ML IJ SOLN: 4.0000 mg | Freq: Once | INTRAMUSCULAR | Status: DC | PRN

## 2024-01-24 MED ORDER — CEFAZOLIN SODIUM-DEXTROSE 2-4 GM/100ML-% IV SOLN
INTRAVENOUS | Status: AC
  Filled 2024-01-24: qty 100

## 2024-01-24 MED ORDER — BUPIVACAINE HCL (PF) 0.5 % IJ SOLN
INTRAMUSCULAR | Status: DC | PRN
Start: 2024-01-24 — End: 2024-01-24
  Administered 2024-01-24: 15 mL via PERINEURAL

## 2024-01-24 MED ORDER — LACTATED RINGERS IV SOLN: INTRAVENOUS | Status: DC

## 2024-01-24 MED ORDER — GABAPENTIN 300 MG PO CAPS
ORAL_CAPSULE | ORAL | Status: AC
  Filled 2024-01-24: qty 1

## 2024-01-24 MED ORDER — SUGAMMADEX SODIUM 200 MG/2ML IV SOLN
INTRAVENOUS | Status: DC | PRN
  Administered 2024-01-24: 250 mg via INTRAVENOUS

## 2024-01-24 MED ORDER — ATROPINE SULFATE 0.4 MG/ML IV SOLN
INTRAVENOUS | Status: AC
  Filled 2024-01-24: qty 1

## 2024-01-24 MED ORDER — ONDANSETRON HCL 4 MG/2ML IJ SOLN
INTRAMUSCULAR | Status: AC
  Filled 2024-01-24: qty 2

## 2024-01-24 MED ORDER — ROCURONIUM BROMIDE 10 MG/ML (PF) SYRINGE
PREFILLED_SYRINGE | INTRAVENOUS | Status: AC
  Filled 2024-01-24: qty 10

## 2024-01-24 MED ORDER — PHENYLEPHRINE HCL-NACL 20-0.9 MG/250ML-% IV SOLN
INTRAVENOUS | Status: DC | PRN
  Administered 2024-01-24: 25 ug/min via INTRAVENOUS

## 2024-01-24 MED ORDER — EPHEDRINE 5 MG/ML INJ
INTRAVENOUS | Status: AC
  Filled 2024-01-24: qty 10

## 2024-01-24 MED ORDER — FENTANYL CITRATE (PF) 100 MCG/2ML IJ SOLN
INTRAMUSCULAR | Status: DC | PRN
  Administered 2024-01-24: 100 ug via INTRAVENOUS

## 2024-01-24 MED ORDER — MIDAZOLAM HCL 2 MG/2ML IJ SOLN
2.0000 mg | Freq: Once | INTRAMUSCULAR | Status: AC
  Administered 2024-01-24: 2 mg via INTRAVENOUS

## 2024-01-24 SURGICAL SUPPLY — 52 items
ANCHOR FIBERTAK RC 2.6 (BLUE) (Anchor) IMPLANT
ANCHOR SUT 1.8 FIBERTAK SB KL (Anchor) IMPLANT
ANCHOR SUT BIO SW 4.75X19.1 (Anchor) IMPLANT
ANCHOR SUT FBRTK 2.6X1.7X2 (Anchor) IMPLANT
BLADE EXCALIBUR 4.0X13 (MISCELLANEOUS) ×2 IMPLANT
BURR OVAL 8 FLU 4.0X13 (MISCELLANEOUS) ×2 IMPLANT
CANNULA 5.75X71 LONG (CANNULA) IMPLANT
CANNULA PASSPORT 5 (CANNULA) IMPLANT
CANNULA PASSPORT BUTTON 10-40 (CANNULA) IMPLANT
CANNULA TWIST IN 8.25X7CM (CANNULA) IMPLANT
CHLORAPREP W/TINT 26 (MISCELLANEOUS) ×2 IMPLANT
CLSR STERI-STRIP ANTIMIC 1/2X4 (GAUZE/BANDAGES/DRESSINGS) ×2 IMPLANT
COOLER ICEMAN CLASSIC (MISCELLANEOUS) ×2 IMPLANT
DRAPE IMP U-DRAPE 54X76 (DRAPES) ×2 IMPLANT
DRAPE INCISE IOBAN 66X45 STRL (DRAPES) IMPLANT
DRAPE SHOULDER BEACH CHAIR (DRAPES) ×2 IMPLANT
DW OUTFLOW CASSETTE/TUBE SET (MISCELLANEOUS) ×2 IMPLANT
GAUZE PAD ABD 8X10 STRL (GAUZE/BANDAGES/DRESSINGS) ×2 IMPLANT
GAUZE SPONGE 4X4 12PLY STRL (GAUZE/BANDAGES/DRESSINGS) ×2 IMPLANT
GLOVE BIO SURGEON STRL SZ 6.5 (GLOVE) ×2 IMPLANT
GLOVE BIOGEL PI IND STRL 6.5 (GLOVE) ×2 IMPLANT
GLOVE BIOGEL PI IND STRL 8 (GLOVE) ×2 IMPLANT
GLOVE ECLIPSE 8.0 STRL XLNG CF (GLOVE) ×2 IMPLANT
GOWN STRL REUS W/ TWL LRG LVL3 (GOWN DISPOSABLE) ×4 IMPLANT
GOWN STRL REUS W/TWL XL LVL3 (GOWN DISPOSABLE) ×2 IMPLANT
KIT STABILIZATION SHOULDER (MISCELLANEOUS) ×2 IMPLANT
KIT STR SPEAR 1.8 FBRTK DISP (KITS) IMPLANT
LASSO CRESCENT QUICKPASS (SUTURE) IMPLANT
LOOP 2 FIBERLINK CLOSED (SUTURE) IMPLANT
MANIFOLD NEPTUNE II (INSTRUMENTS) ×2 IMPLANT
NDL HD SCORPION MEGA LOADER (NEEDLE) IMPLANT
NDL SAFETY ECLIPSE 18X1.5 (NEEDLE) ×2 IMPLANT
PACK ARTHROSCOPY DSU (CUSTOM PROCEDURE TRAY) ×2 IMPLANT
PACK BASIN DAY SURGERY FS (CUSTOM PROCEDURE TRAY) ×2 IMPLANT
PAD COLD SHLDR WRAP-ON (PAD) ×2 IMPLANT
RESTRAINT HEAD UNIVERSAL NS (MISCELLANEOUS) ×2 IMPLANT
SHEET MEDIUM DRAPE 40X70 STRL (DRAPES) ×2 IMPLANT
SLEEVE SCD COMPRESS KNEE MED (STOCKING) ×2 IMPLANT
SLING ARM FOAM STRAP LRG (SOFTGOODS) IMPLANT
SUT FIBERWIRE #2 38 T-5 BLUE (SUTURE) IMPLANT
SUT MNCRL AB 4-0 PS2 18 (SUTURE) ×2 IMPLANT
SUT PDS AB 0 CT 36 (SUTURE) IMPLANT
SUT TIGER TAPE 7 IN WHITE (SUTURE) IMPLANT
SUTURE FIBERWR #2 38 T-5 BLUE (SUTURE) IMPLANT
SUTURE TAPE TIGERLINK 1.3MM BL (SUTURE) IMPLANT
SUTURETAPE TIGERLINK 1.3MM BL (SUTURE) IMPLANT
SYR 5ML LL (SYRINGE) ×2 IMPLANT
TAPE FIBER 2MM 7IN #2 BLUE (SUTURE) IMPLANT
TOWEL GREEN STERILE FF (TOWEL DISPOSABLE) ×4 IMPLANT
TUBE CONNECTING 20X1/4 (TUBING) ×2 IMPLANT
TUBING ARTHROSCOPY IRRIG 16FT (MISCELLANEOUS) ×2 IMPLANT
WAND ABLATOR APOLLO I90 (BUR) ×2 IMPLANT

## 2024-01-24 NOTE — Interval H&P Note (Signed)
 All questions answered, patient wants to proceed with procedure. ? ?

## 2024-01-24 NOTE — Anesthesia Postprocedure Evaluation (Signed)
 Anesthesia Post Note  Patient: Brooke French  Procedure(s) Performed: SHOULDER ARTHROSCOPY WITH SUBACROMIAL DECOMPRESSION, ROTATOR CUFF REPAIR AND BICEP TENDON REPAIR (Left) SHOULDER ARTHROSCOPY WITH DISTAL CLAVICLE EXCISION (Left: Shoulder)     Patient location during evaluation: PACU Anesthesia Type: Regional and General Level of consciousness: awake Pain management: pain level controlled Vital Signs Assessment: post-procedure vital signs reviewed and stable Respiratory status: spontaneous breathing, nonlabored ventilation and respiratory function stable Cardiovascular status: blood pressure returned to baseline and stable Postop Assessment: no apparent nausea or vomiting Anesthetic complications: no   No notable events documented.  Last Vitals:  Vitals:   01/24/24 0930 01/24/24 0947  BP: (!) 107/59 (!) 124/59  Pulse: 64 64  Resp: 13 16  Temp:  (!) 36.2 C  SpO2: 90% 96%    Last Pain:  Vitals:   01/24/24 0947  TempSrc:   PainSc: 0-No pain                 Tiawanna Luchsinger P Biagio Snelson

## 2024-01-24 NOTE — Anesthesia Procedure Notes (Signed)
 Anesthesia Regional Block: Interscalene brachial plexus block   Pre-Anesthetic Checklist: , timeout performed,  Correct Patient, Correct Site, Correct Laterality,  Correct Procedure, Correct Position, site marked,  Risks and benefits discussed,  Surgical consent,  Pre-op evaluation,  At surgeon's request and post-op pain management  Laterality: Left  Prep: chloraprep       Needles:  Injection technique: Single-shot  Needle Type: Echogenic Stimulator Needle     Needle Length: 9cm  Needle Gauge: 21     Additional Needles:   Procedures:,,,, ultrasound used (permanent image in chart),,    Narrative:  Start time: 01/24/2024 7:05 AM End time: 01/24/2024 7:15 AM Injection made incrementally with aspirations every 5 mL.  Performed by: Personally  Anesthesiologist: Leonides Grills, MD  Additional Notes: Functioning IV was confirmed and monitors were applied.  A timeout was performed. Sterile prep, hand hygiene and sterile gloves were used. A 90mm 21ga Arrow echogenic stimulator needle was used. Negative aspiration and negative test dose prior to incremental administration of local anesthetic. The patient tolerated the procedure well.  Ultrasound guidance: relevent anatomy identified, needle position confirmed, local anesthetic spread visualized around nerve(s), vascular puncture avoided.  Image printed for medical record.

## 2024-01-24 NOTE — Transfer of Care (Signed)
 Immediate Anesthesia Transfer of Care Note  Patient: Brooke French  Procedure(s) Performed: SHOULDER ARTHROSCOPY WITH SUBACROMIAL DECOMPRESSION, ROTATOR CUFF REPAIR AND BICEP TENDON REPAIR (Left) SHOULDER ARTHROSCOPY WITH DISTAL CLAVICLE EXCISION (Left: Shoulder)  Patient Location: PACU  Anesthesia Type:GA combined with regional for post-op pain  Level of Consciousness: awake, alert , and oriented  Airway & Oxygen Therapy: Patient Spontanous Breathing and Patient connected to face mask oxygen  Post-op Assessment: Report given to RN and Post -op Vital signs reviewed and stable  Post vital signs: Reviewed and stable  Last Vitals:  Vitals Value Taken Time  BP 121/57 01/24/24 0911  Temp 36.1 C 01/24/24 0911  Pulse 71 01/24/24 0914  Resp 14 01/24/24 0914  SpO2 99 % 01/24/24 0914  Vitals shown include unfiled device data.  Last Pain:  Vitals:   01/24/24 0911  TempSrc:   PainSc: 0-No pain      Patients Stated Pain Goal: 1 (01/24/24 0981)  Complications: No notable events documented.

## 2024-01-24 NOTE — Progress Notes (Signed)
 Assisted Dr. Bradley Ferris with left, interscalene , ultrasound guided block. Side rails up, monitors on throughout procedure. See vital signs in flow sheet. Tolerated Procedure well.

## 2024-01-24 NOTE — Anesthesia Procedure Notes (Signed)
 Procedure Name: Intubation Date/Time: 01/24/2024 7:56 AM  Performed by: Lauralyn Primes, CRNAPre-anesthesia Checklist: Patient identified, Emergency Drugs available, Suction available and Patient being monitored Patient Re-evaluated:Patient Re-evaluated prior to induction Oxygen Delivery Method: Circle system utilized Preoxygenation: Pre-oxygenation with 100% oxygen Induction Type: IV induction Ventilation: Mask ventilation without difficulty and Oral airway inserted - appropriate to patient size Laryngoscope Size: Mac and 3 Grade View: Grade II Tube type: Oral Tube size: 7.0 mm Number of attempts: 1 Airway Equipment and Method: Stylet, Oral airway and Bite block Placement Confirmation: ETT inserted through vocal cords under direct vision, positive ETCO2 and breath sounds checked- equal and bilateral Secured at: 22 cm Tube secured with: Tape Dental Injury: Teeth and Oropharynx as per pre-operative assessment

## 2024-01-24 NOTE — Op Note (Signed)
 Orthopaedic Surgery Operative Note (CSN: [id number])  Brooke French  [birth year] Date of Surgery: 01/24/2024   DIAGNOSES: Left shoulder, acute on chronic rotator cuff tear, SLAP tear, biceps tendinitis, AC arthritis, and subacromial impingement.  POST-OPERATIVE DIAGNOSIS: same  PROCEDURE: Arthroscopic extensive debridement - 29823 Subdeltoid Bursa, Supraspinatus Tendon, Anterior Labrum, Superior Labrum, Posterior Labrum, and glenoid bone, glenoid cartilage, humeral bone and humeral cartilage Arthroscopic distal clavicle excision - 40981 Arthroscopic subacromial decompression - 19147 Arthroscopic rotator cuff repair - 82956 Arthroscopic biceps tenodesis - 21308   OPERATIVE FINDING: Exam under anesthesia: Normal Articular space:  Anterior and posterior labral tearing Chondral surfaces: Normal Biceps:  Type II SLAP tear Subscapularis: Intact  Supraspinatus: Complete tear leading edge supraspinatus tear with poor tissue quality throughout. Infraspinatus: Intact    Unfortunately tissue quality was generally poor however there was no indication to use a patch.  We were able to perform a 2 x 2 repair.  Post-operative plan: The patient will be non-weightbearing in a sling .  The patient will be discharged home.  DVT prophylaxis not indicated in ambulatory upper extremity patient without known risk factors.   Pain control with PRN pain medication preferring oral medicines.  Follow up plan will be scheduled in approximately 7 days for incision check and XR.  Surgeons:Primary: Bjorn Pippin, MD Assistants:Caroline McBane, PA-C Location: [facility] OR ROOM 1 Anesthesia: General with Exparel interscalene block Antibiotics: Ancef 2 g Tourniquet time: None Estimated Blood Loss: Minimal Complications: None Specimens: None Implants: Implant Name Type Inv. Item Serial No. Manufacturer Lot No. LRB No. Used Action  ANCHOR SUT 1.8 FIBERTAK SB KL - [id number] Anchor ANCHOR SUT 1.8 FIBERTAK SB KL   [employer] [id number] Left 1 Implanted  ANCHOR SUT 1.8 FIBERTAK SB KL - [id number] Anchor ANCHOR SUT 1.8 FIBERTAK SB KL  [employer] [id number] Left 1 Implanted  ANCHOR SUT FBRTK 2.6X1.7X2 - [id number] Anchor ANCHOR SUT FBRTK 2.6X1.7X2  [employer] [id number] Left 1 Implanted  ANCHOR FIBERTAK RC 2.6 [facility]) - [id number] Anchor ANCHOR FIBERTAK RC 2.6 [facility])  [employer] [id number] Left 1 Implanted  ANCHOR SUT BIO SW 4.75X19.1 - [id number] Anchor ANCHOR SUT BIO SW 4.75X19.1  [employer] [id number] Left 1 Implanted  ANCHOR SUT BIO SW 4.75X19.1 - [id number] Anchor ANCHOR SUT BIO SW 4.75X19.1  Marcie Bal [id number] Left 1 Implanted    Indications for Surgery:   Brooke French is a 63 y.o. female with continued shoulder pain refractory to nonoperative measures for extended period of time.    The risks and benefits were explained at length including but not limited to continued pain, cuff failure, biceps tenodesis failure, stiffness, need for further surgery and infection.   Procedure:   Patient was correctly identified in the preoperative holding area and operative site marked.  Patient brought to OR and positioned beachchair on an Cisne table ensuring that all bony prominences were padded and the head was in an appropriate location.  Anesthesia was induced and the operative shoulder was prepped and draped in the usual sterile fashion.  Timeout was called preincision.  A standard posterior viewing portal was made after localizing the portal with a spinal needle.  An anterior accessory portal was also made.  After clearing the articular space the camera was positioned in the subacromial space.  Findings above.    Extensive debridement was performed of the anterior interval tissue, labral fraying and the bursa.  Glenoid bone, glenoid cartilage, humeral bone were all debrided.  Subacromial decompression: We made a lateral  portal with spinal needle guidance. We then proceeded to debride bursal tissue extensively  with a shaver and arthrocare device. At that point we continued to identify the borders of the acromion and identify the spur. We then carefully preserved the deltoid fascia and used a burr to convert the acromion to a Type 1 flat acromion without issue.  Arthroscopic rotator cuff repair: Once the above is complete we used a shaver as well as a bur to prepare the tuberosity and clear soft tissue so that there was bony bleeding and appropriate bloody flecks for healing.  We then placed 2 Medial Row 2.6 mm fiber tack anchors with knotless mechanisms and tape.  We used a scorpion as well as a link suture to pass all of the sutures from each anchor through the cuff.  We then were able to use the knotless mechanism sutures to perform a double medial row tiedown compressing the medial row without overtensioning. Once this was complete we cut the switch sutures and performed a speed bridge type repair pulling the tapes over to 2 lateral row 4.75 mm bio composite swivel locks.  This provided compression of the cuff to the tuberosity.  Biceps tenodesis: We marked the tendon and then performed a tenotomy and debridement of the stump in the articular space. We then identified the biceps tendon in its groove suprapec with the arthroscope in the lateral portal taking care to move from lateral to medial to avoid injury to the subscapularis. At that point we unroofed the tendon itself and mobilized it. An accessory anterior portal was made in line with the tendon and we grasped it from the anterior superior portal and worked from the accessory anterior portal. Two Fibertak 1.94mm knotless anchors were placed in the groove and the tendon was secured in a luggage loop style fashion with a pass of the limb of suture through the tendon using a scorpion device to avoid pull-through.  Repair was completed with good tension on the tendon.  Residual stump of the tendon was removed after being resected with a RF ablator.  Distal  Clavicle resection:  The scope was placed in the subacromial space from the posterior portal.  A hemostat was placed through the anterior portal and we spread at the [facility] joint.  A burr was then inserted and 10 mm of distal clavicle was resected taking care to avoid damage to the capsule around the joint and avoiding overhanging bone posteriorly.    The incisions were closed with absorbable monocryl and steri strips.  A sterile dressing was placed along with a sling. The patient was awoken from general anesthesia and taken to the PACU in stable condition without complication.   Alfonse Alpers, PA-C, present and scrubbed throughout the case, critical for completion in a timely fashion, and for retraction, instrumentation, closure.

## 2024-01-25 ENCOUNTER — Encounter (HOSPITAL_BASED_OUTPATIENT_CLINIC_OR_DEPARTMENT_OTHER): Payer: Self-pay | Admitting: Orthopaedic Surgery

## 2024-01-29 DIAGNOSIS — M19012 Primary osteoarthritis, left shoulder: Secondary | ICD-10-CM | POA: Diagnosis not present

## 2024-02-07 DIAGNOSIS — M6281 Muscle weakness (generalized): Secondary | ICD-10-CM | POA: Diagnosis not present

## 2024-02-07 DIAGNOSIS — S46012D Strain of muscle(s) and tendon(s) of the rotator cuff of left shoulder, subsequent encounter: Secondary | ICD-10-CM | POA: Diagnosis not present

## 2024-02-07 DIAGNOSIS — M25612 Stiffness of left shoulder, not elsewhere classified: Secondary | ICD-10-CM | POA: Diagnosis not present

## 2024-02-14 DIAGNOSIS — M6281 Muscle weakness (generalized): Secondary | ICD-10-CM | POA: Diagnosis not present

## 2024-02-14 DIAGNOSIS — M25612 Stiffness of left shoulder, not elsewhere classified: Secondary | ICD-10-CM | POA: Diagnosis not present

## 2024-02-14 DIAGNOSIS — S46012D Strain of muscle(s) and tendon(s) of the rotator cuff of left shoulder, subsequent encounter: Secondary | ICD-10-CM | POA: Diagnosis not present

## 2024-02-19 DIAGNOSIS — S46012D Strain of muscle(s) and tendon(s) of the rotator cuff of left shoulder, subsequent encounter: Secondary | ICD-10-CM | POA: Diagnosis not present

## 2024-02-19 DIAGNOSIS — M25612 Stiffness of left shoulder, not elsewhere classified: Secondary | ICD-10-CM | POA: Diagnosis not present

## 2024-02-19 DIAGNOSIS — M6281 Muscle weakness (generalized): Secondary | ICD-10-CM | POA: Diagnosis not present

## 2024-02-20 DIAGNOSIS — E785 Hyperlipidemia, unspecified: Secondary | ICD-10-CM | POA: Diagnosis not present

## 2024-02-20 DIAGNOSIS — F32A Depression, unspecified: Secondary | ICD-10-CM | POA: Diagnosis not present

## 2024-02-20 DIAGNOSIS — Z6841 Body Mass Index (BMI) 40.0 and over, adult: Secondary | ICD-10-CM | POA: Diagnosis not present

## 2024-02-20 DIAGNOSIS — E039 Hypothyroidism, unspecified: Secondary | ICD-10-CM | POA: Diagnosis not present

## 2024-02-27 ENCOUNTER — Other Ambulatory Visit (HOSPITAL_COMMUNITY): Payer: Self-pay

## 2024-02-27 MED ORDER — VALACYCLOVIR HCL 500 MG PO TABS
500.0000 mg | ORAL_TABLET | Freq: Every day | ORAL | 3 refills | Status: AC
  Filled 2024-02-27: qty 90, 90d supply, fill #0
  Filled 2024-06-02: qty 90, 90d supply, fill #1

## 2024-02-28 ENCOUNTER — Other Ambulatory Visit: Payer: Self-pay

## 2024-02-28 ENCOUNTER — Other Ambulatory Visit (HOSPITAL_COMMUNITY): Payer: Self-pay

## 2024-02-28 DIAGNOSIS — M6281 Muscle weakness (generalized): Secondary | ICD-10-CM | POA: Diagnosis not present

## 2024-02-28 DIAGNOSIS — M25612 Stiffness of left shoulder, not elsewhere classified: Secondary | ICD-10-CM | POA: Diagnosis not present

## 2024-02-28 DIAGNOSIS — S46012D Strain of muscle(s) and tendon(s) of the rotator cuff of left shoulder, subsequent encounter: Secondary | ICD-10-CM | POA: Diagnosis not present

## 2024-02-28 MED ORDER — LEVOTHYROXINE SODIUM 125 MCG PO TABS
125.0000 ug | ORAL_TABLET | Freq: Every morning | ORAL | 1 refills | Status: AC
  Filled 2024-02-28: qty 90, 90d supply, fill #0
  Filled 2024-06-02: qty 90, 90d supply, fill #1

## 2024-02-28 MED ORDER — TEMAZEPAM 30 MG PO CAPS
30.0000 mg | ORAL_CAPSULE | Freq: Every evening | ORAL | 0 refills | Status: DC
Start: 2024-02-28 — End: 2024-06-02
  Filled 2024-02-28: qty 90, 90d supply, fill #0

## 2024-03-03 DIAGNOSIS — S46012D Strain of muscle(s) and tendon(s) of the rotator cuff of left shoulder, subsequent encounter: Secondary | ICD-10-CM | POA: Diagnosis not present

## 2024-03-03 DIAGNOSIS — M25612 Stiffness of left shoulder, not elsewhere classified: Secondary | ICD-10-CM | POA: Diagnosis not present

## 2024-03-03 DIAGNOSIS — M6281 Muscle weakness (generalized): Secondary | ICD-10-CM | POA: Diagnosis not present

## 2024-03-04 ENCOUNTER — Other Ambulatory Visit (HOSPITAL_COMMUNITY): Payer: Self-pay

## 2024-03-04 DIAGNOSIS — S46012D Strain of muscle(s) and tendon(s) of the rotator cuff of left shoulder, subsequent encounter: Secondary | ICD-10-CM | POA: Diagnosis not present

## 2024-03-06 ENCOUNTER — Other Ambulatory Visit (HOSPITAL_COMMUNITY): Payer: Self-pay

## 2024-03-11 DIAGNOSIS — S46012D Strain of muscle(s) and tendon(s) of the rotator cuff of left shoulder, subsequent encounter: Secondary | ICD-10-CM | POA: Diagnosis not present

## 2024-03-11 DIAGNOSIS — M25612 Stiffness of left shoulder, not elsewhere classified: Secondary | ICD-10-CM | POA: Diagnosis not present

## 2024-03-11 DIAGNOSIS — M6281 Muscle weakness (generalized): Secondary | ICD-10-CM | POA: Diagnosis not present

## 2024-03-14 ENCOUNTER — Other Ambulatory Visit: Payer: Self-pay | Admitting: Internal Medicine

## 2024-03-14 DIAGNOSIS — Z1231 Encounter for screening mammogram for malignant neoplasm of breast: Secondary | ICD-10-CM

## 2024-03-18 DIAGNOSIS — S46012D Strain of muscle(s) and tendon(s) of the rotator cuff of left shoulder, subsequent encounter: Secondary | ICD-10-CM | POA: Diagnosis not present

## 2024-03-18 DIAGNOSIS — M6281 Muscle weakness (generalized): Secondary | ICD-10-CM | POA: Diagnosis not present

## 2024-03-18 DIAGNOSIS — M25612 Stiffness of left shoulder, not elsewhere classified: Secondary | ICD-10-CM | POA: Diagnosis not present

## 2024-03-24 ENCOUNTER — Other Ambulatory Visit (HOSPITAL_COMMUNITY): Payer: Self-pay

## 2024-03-24 MED ORDER — TRAMADOL HCL 50 MG PO TABS
50.0000 mg | ORAL_TABLET | Freq: Four times a day (QID) | ORAL | 0 refills | Status: AC | PRN
  Filled 2024-03-24: qty 20, 5d supply, fill #0

## 2024-03-26 DIAGNOSIS — M25612 Stiffness of left shoulder, not elsewhere classified: Secondary | ICD-10-CM | POA: Diagnosis not present

## 2024-03-26 DIAGNOSIS — M6281 Muscle weakness (generalized): Secondary | ICD-10-CM | POA: Diagnosis not present

## 2024-03-26 DIAGNOSIS — S46012D Strain of muscle(s) and tendon(s) of the rotator cuff of left shoulder, subsequent encounter: Secondary | ICD-10-CM | POA: Diagnosis not present

## 2024-04-01 DIAGNOSIS — M25612 Stiffness of left shoulder, not elsewhere classified: Secondary | ICD-10-CM | POA: Diagnosis not present

## 2024-04-01 DIAGNOSIS — S46012D Strain of muscle(s) and tendon(s) of the rotator cuff of left shoulder, subsequent encounter: Secondary | ICD-10-CM | POA: Diagnosis not present

## 2024-04-01 DIAGNOSIS — M6281 Muscle weakness (generalized): Secondary | ICD-10-CM | POA: Diagnosis not present

## 2024-04-10 DIAGNOSIS — M6281 Muscle weakness (generalized): Secondary | ICD-10-CM | POA: Diagnosis not present

## 2024-04-10 DIAGNOSIS — M25612 Stiffness of left shoulder, not elsewhere classified: Secondary | ICD-10-CM | POA: Diagnosis not present

## 2024-04-10 DIAGNOSIS — S46012D Strain of muscle(s) and tendon(s) of the rotator cuff of left shoulder, subsequent encounter: Secondary | ICD-10-CM | POA: Diagnosis not present

## 2024-04-14 DIAGNOSIS — M6281 Muscle weakness (generalized): Secondary | ICD-10-CM | POA: Diagnosis not present

## 2024-04-14 DIAGNOSIS — S46012D Strain of muscle(s) and tendon(s) of the rotator cuff of left shoulder, subsequent encounter: Secondary | ICD-10-CM | POA: Diagnosis not present

## 2024-04-14 DIAGNOSIS — M25612 Stiffness of left shoulder, not elsewhere classified: Secondary | ICD-10-CM | POA: Diagnosis not present

## 2024-04-21 ENCOUNTER — Ambulatory Visit: Payer: Commercial Managed Care - PPO | Admitting: Family

## 2024-04-21 ENCOUNTER — Other Ambulatory Visit: Payer: Commercial Managed Care - PPO

## 2024-04-21 DIAGNOSIS — Z961 Presence of intraocular lens: Secondary | ICD-10-CM | POA: Diagnosis not present

## 2024-04-21 DIAGNOSIS — H18513 Endothelial corneal dystrophy, bilateral: Secondary | ICD-10-CM | POA: Diagnosis not present

## 2024-04-22 ENCOUNTER — Ambulatory Visit: Payer: Commercial Managed Care - PPO | Admitting: Family

## 2024-04-22 ENCOUNTER — Inpatient Hospital Stay: Payer: 59

## 2024-04-22 DIAGNOSIS — S46012D Strain of muscle(s) and tendon(s) of the rotator cuff of left shoulder, subsequent encounter: Secondary | ICD-10-CM | POA: Diagnosis not present

## 2024-04-22 DIAGNOSIS — M25612 Stiffness of left shoulder, not elsewhere classified: Secondary | ICD-10-CM | POA: Diagnosis not present

## 2024-04-22 DIAGNOSIS — M6281 Muscle weakness (generalized): Secondary | ICD-10-CM | POA: Diagnosis not present

## 2024-04-28 ENCOUNTER — Other Ambulatory Visit (HOSPITAL_COMMUNITY): Payer: Self-pay

## 2024-04-28 ENCOUNTER — Ambulatory Visit
Admission: RE | Admit: 2024-04-28 | Discharge: 2024-04-28 | Disposition: A | Discharge status: Home / Self Care | Source: Ambulatory Visit | Attending: Internal Medicine | Admitting: Internal Medicine

## 2024-04-28 ENCOUNTER — Encounter (HOSPITAL_COMMUNITY): Payer: Self-pay

## 2024-04-28 DIAGNOSIS — Z1231 Encounter for screening mammogram for malignant neoplasm of breast: Secondary | ICD-10-CM

## 2024-04-28 MED ORDER — BUPROPION HCL ER (XL) 150 MG PO TB24
150.0000 mg | ORAL_TABLET | Freq: Every day | ORAL | 2 refills | Status: AC
  Filled 2024-06-04: qty 90, 90d supply, fill #0

## 2024-04-29 DIAGNOSIS — M6281 Muscle weakness (generalized): Secondary | ICD-10-CM | POA: Diagnosis not present

## 2024-04-29 DIAGNOSIS — M25612 Stiffness of left shoulder, not elsewhere classified: Secondary | ICD-10-CM | POA: Diagnosis not present

## 2024-04-29 DIAGNOSIS — S46012D Strain of muscle(s) and tendon(s) of the rotator cuff of left shoulder, subsequent encounter: Secondary | ICD-10-CM | POA: Diagnosis not present

## 2024-05-06 DIAGNOSIS — M6281 Muscle weakness (generalized): Secondary | ICD-10-CM | POA: Diagnosis not present

## 2024-05-06 DIAGNOSIS — M25612 Stiffness of left shoulder, not elsewhere classified: Secondary | ICD-10-CM | POA: Diagnosis not present

## 2024-05-06 DIAGNOSIS — S46012D Strain of muscle(s) and tendon(s) of the rotator cuff of left shoulder, subsequent encounter: Secondary | ICD-10-CM | POA: Diagnosis not present

## 2024-06-02 ENCOUNTER — Other Ambulatory Visit (HOSPITAL_COMMUNITY): Payer: Self-pay

## 2024-06-02 MED ORDER — TEMAZEPAM 30 MG PO CAPS
30.0000 mg | ORAL_CAPSULE | Freq: Every day | ORAL | 0 refills | Status: AC
  Filled 2024-06-02: qty 90, 90d supply, fill #0

## 2024-06-03 ENCOUNTER — Ambulatory Visit: Payer: 59 | Admitting: Dermatology

## 2024-06-04 ENCOUNTER — Other Ambulatory Visit (HOSPITAL_COMMUNITY): Payer: Self-pay

## 2024-06-13 ENCOUNTER — Other Ambulatory Visit (HOSPITAL_COMMUNITY): Payer: Self-pay

## 2024-08-25 DIAGNOSIS — D638 Anemia in other chronic diseases classified elsewhere: Secondary | ICD-10-CM | POA: Diagnosis not present

## 2024-08-28 ENCOUNTER — Other Ambulatory Visit (HOSPITAL_COMMUNITY): Payer: Self-pay

## 2024-09-01 DIAGNOSIS — E039 Hypothyroidism, unspecified: Secondary | ICD-10-CM | POA: Diagnosis not present

## 2024-09-01 DIAGNOSIS — R82998 Other abnormal findings in urine: Secondary | ICD-10-CM | POA: Diagnosis not present

## 2024-09-01 DIAGNOSIS — Z1212 Encounter for screening for malignant neoplasm of rectum: Secondary | ICD-10-CM | POA: Diagnosis not present

## 2024-09-01 DIAGNOSIS — Z0000 Encounter for general adult medical examination without abnormal findings: Secondary | ICD-10-CM | POA: Diagnosis not present

## 2024-09-24 DIAGNOSIS — E039 Hypothyroidism, unspecified: Secondary | ICD-10-CM | POA: Diagnosis not present

## 2024-09-24 DIAGNOSIS — D638 Anemia in other chronic diseases classified elsewhere: Secondary | ICD-10-CM | POA: Diagnosis not present

## 2024-10-14 DIAGNOSIS — K080 Exfoliation of teeth due to systemic causes: Secondary | ICD-10-CM | POA: Diagnosis not present
